# Patient Record
Sex: Male | Born: 1970
Health system: Southern US, Community
[De-identification: ages and names within clinical notes are randomized; demographics above are authoritative.]

## PROBLEM LIST (undated history)

## (undated) DIAGNOSIS — IMO0002 Reserved for concepts with insufficient information to code with codable children: Secondary | ICD-10-CM

## (undated) DIAGNOSIS — F419 Anxiety disorder, unspecified: Secondary | ICD-10-CM

## (undated) HISTORY — DX: Reserved for concepts with insufficient information to code with codable children: IMO0002

## (undated) HISTORY — PX: LIPOMA EXCISION: SHX5283

## (undated) HISTORY — DX: Morbid (severe) obesity due to excess calories: E66.01

## (undated) HISTORY — DX: Anxiety disorder, unspecified: F41.9

---

## 1991-07-07 HISTORY — PX: KNEE ARTHROSCOPY: SUR90

## 2010-01-24 ENCOUNTER — Ambulatory Visit: Payer: Self-pay | Admitting: Cardiology

## 2013-03-14 DIAGNOSIS — F410 Panic disorder [episodic paroxysmal anxiety] without agoraphobia: Secondary | ICD-10-CM | POA: Insufficient documentation

## 2013-07-20 DIAGNOSIS — F411 Generalized anxiety disorder: Secondary | ICD-10-CM | POA: Diagnosis not present

## 2013-08-07 ENCOUNTER — Encounter (INDEPENDENT_AMBULATORY_CARE_PROVIDER_SITE_OTHER): Payer: Self-pay

## 2013-08-07 ENCOUNTER — Ambulatory Visit (INDEPENDENT_AMBULATORY_CARE_PROVIDER_SITE_OTHER): Payer: Medicare Other | Admitting: Family Medicine

## 2013-08-07 ENCOUNTER — Encounter: Payer: Self-pay | Admitting: Family Medicine

## 2013-08-07 VITALS — BP 115/78 | HR 65 | Temp 98.5°F | Ht 69.0 in | Wt 213.0 lb

## 2013-08-07 DIAGNOSIS — D179 Benign lipomatous neoplasm, unspecified: Secondary | ICD-10-CM

## 2013-08-07 DIAGNOSIS — F411 Generalized anxiety disorder: Secondary | ICD-10-CM | POA: Diagnosis not present

## 2013-08-07 MED ORDER — BUPROPION HCL ER (XL) 150 MG PO TB24: 150.0000 mg | ORAL_TABLET | Freq: Every day | ORAL | Status: DC

## 2013-08-07 NOTE — Patient Instructions (Signed)

## 2013-08-07 NOTE — Progress Notes (Signed)
   Subjective:    Patient ID: Tyler Barton, male    DOB: 02-Jul-1971, 43 y.o.   MRN: [id number]  HPI This 43 y.o. male presents for evaluation of re-establishment.  He has hx of GAD. He is seeing psychiatry for GAD.  He is taking celexa, buspar, and diazepam.  He states He wants to get back on wellbutrin because it helped with his energy and he felt better. He has a lipoma on his back and wants to get it removed.   Review of Systems C/o lipoma and GAD   No chest pain, SOB, HA, dizziness, vision change, N/V, diarrhea, constipation, dysuria, urinary urgency or frequency, myalgias, arthralgias or rash.  Objective:   Physical Exam  Vital signs noted  Well developed well nourished male.  HEENT - Head atraumatic Normocephalic                Eyes - PERRLA, Conjuctiva - clear Sclera- Clear EOMI                Ears - EAC's Wnl TM's Wnl Gross Hearing WNL                Nose - Nares patent                 Throat - oropharanx wnl Respiratory - Lungs CTA bilateral Cardiac - RRR S1 and S2 without murmur GI - Abdomen soft Nontender and bowel sounds active x 4 Skin - lipoma on LS spine       Assessment & Plan:  Lipoma - Plan: Ambulatory referral to General Surgery  GAD (generalized anxiety disorder) - Plan: buPROPion (WELLBUTRIN XL) 150 MG 24 hr tablet Discussed with patient that he needs to get his buspar and valium filled at his psychiatrist office and this will not be filled here .  Discussed he needs to follow up for CPE and labs.  Lysbeth Penner FNP

## 2013-08-17 ENCOUNTER — Encounter (INDEPENDENT_AMBULATORY_CARE_PROVIDER_SITE_OTHER): Payer: Self-pay | Admitting: General Surgery

## 2013-08-17 ENCOUNTER — Ambulatory Visit (INDEPENDENT_AMBULATORY_CARE_PROVIDER_SITE_OTHER): Payer: Medicare Other | Admitting: General Surgery

## 2013-08-17 VITALS — BP 120/80 | HR 76 | Temp 98.4°F | Resp 14 | Ht 69.0 in | Wt 209.8 lb

## 2013-08-17 DIAGNOSIS — L918 Other hypertrophic disorders of the skin: Secondary | ICD-10-CM | POA: Insufficient documentation

## 2013-08-17 DIAGNOSIS — D171 Benign lipomatous neoplasm of skin and subcutaneous tissue of trunk: Secondary | ICD-10-CM

## 2013-08-17 DIAGNOSIS — D1779 Benign lipomatous neoplasm of other sites: Secondary | ICD-10-CM | POA: Diagnosis not present

## 2013-08-17 DIAGNOSIS — L909 Atrophic disorder of skin, unspecified: Secondary | ICD-10-CM

## 2013-08-17 DIAGNOSIS — L919 Hypertrophic disorder of the skin, unspecified: Secondary | ICD-10-CM

## 2013-08-17 NOTE — Progress Notes (Signed)
Patient ID: Tyler Barton, male   DOB: 04-Apr-1971, 43 y.o.   MRN: [id number]  Chief Complaint  Patient presents with  . New Evaluation    eval Lipoma on back    HPI Tyler Barton is a 43 y.o. male.   HPI 43 yo WM referred by Stevan Born, NP for evaluation of lower back subcutaneous mass. The patient states the area has been there since around 1990. Over the years it has slowly gotten larger. 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 As it has gotten larger in size it is more uncomfortable to rest his back against a chair frame or an object. It is never been red, swollen or draining any fluid. He denies any weight loss. He denies any fevers or chills. He denies any night sweats. He denies any other soft tissue masses. He does also complain of a right shoulder skin tags that he would like removed as well. He denies any family history of soft tissue cancer. He does not smoke. He does have panic attacks. Past Medical History  Diagnosis Date  . Anxiety   . DDD (degenerative disc disease)     Past Surgical History  Procedure Laterality Date  . Knee arthroscopy Left 1993    Family History  Problem Relation Age of Onset  . Anxiety disorder Mother   . Anxiety disorder Father     Social History History  Substance Use Topics  . Smoking status: Never Smoker   . Smokeless tobacco: Never Used  . Alcohol Use: Yes     Comment: very rare    No Known Allergies  Current Outpatient Prescriptions  Medication Sig Dispense Refill  . buPROPion (WELLBUTRIN XL) 150 MG 24 hr tablet Take 1 tablet (150 mg total) by mouth daily.  30 tablet  11  . busPIRone (BUSPAR) 5 MG tablet Take 5 mg by mouth 2 (two) times daily.      . citalopram (CELEXA) 20 MG tablet Take 20 mg by mouth daily.      . diazepam (VALIUM) 5 MG tablet Take 5 mg by mouth every 8 (eight) hours as needed for anxiety.       No current facility-administered medications for this visit.    Review  of Systems Review of Systems  Constitutional: Negative for fever, chills, appetite change and unexpected weight change.  HENT: Negative for congestion and trouble swallowing.   Eyes: Negative for visual disturbance.  Respiratory: Negative for chest tightness and shortness of breath.   Cardiovascular: Negative for chest pain and leg swelling.       No PND, no orthopnea, no DOE  Gastrointestinal:       See HPI  Genitourinary: Negative for dysuria and hematuria.  Musculoskeletal: Negative.   Skin: Negative for rash.  Neurological: Negative for seizures and speech difficulty.  Hematological: Does not bruise/bleed easily.  Psychiatric/Behavioral: Negative for behavioral problems and confusion. The patient is nervous/anxious (has anxiety/panic attacks).     Blood pressure 120/80, pulse 76, temperature 98.4 F (36.9 C), temperature source Oral, resp. rate 14, height 5\' 9"  (1.753 m), weight 209 lb 12.8 oz (95.165 kg).  Physical Exam Physical Exam  Constitutional: He is oriented to person, place, and time. He appears well-developed and well-nourished. No distress.  HENT:  Head: Normocephalic and atraumatic.  Right Ear: External ear normal.  Left Ear: External ear normal.  Eyes: Conjunctivae are normal. No scleral icterus.  Neck: Normal range of motion.  Neck supple. No tracheal deviation present. No thyromegaly present.  Cardiovascular: Normal rate, normal heart sounds and intact distal pulses.   Pulmonary/Chest: Effort normal and breath sounds normal. No respiratory distress. He has no wheezes.  Abdominal: He exhibits no distension.  Musculoskeletal: Normal range of motion. He exhibits no edema and no tenderness.  Lymphadenopathy:    He has no cervical adenopathy.  Neurological: He is alert and oriented to person, place, and time. He exhibits normal muscle tone.  Skin: Skin is warm and dry. No rash noted. He is not diaphoretic. No erythema. No pallor.     Lower thoracic/upper lumbar  mid back subcu mass approx 5.5 cm wide x 4 cm tall; mobile. Well circumscribed. Soft, NT. No overlying skin changes. Small fleshy skin tag Rt scapula   Psychiatric: He has a normal mood and affect. His behavior is normal. Judgment and thought content normal.    Data Reviewed [place], NP note  Assessment    Lower back subcutaneous mass Back skin tag     Plan    We discussed the etiology and management of lipomas. The patient was given educational material. We discussed that the majority of lipomas are benign although on a rare occasion it can be malignant. I explained also the differential could be a large epidermoid inclusion cyst but I favor a lipoma  We discussed observation versus surgical excision. We discussed the risks and benefits of surgery including but not limited to bleeding, infection, injury to surrounding structures, scarring, cosmetic concerns, possible temporary drain placement, blood clot formation, anesthesia issues, possible recurrence, and the typical postoperative course.   The patient has elected to Proceed with excision of lower back subcutaneous mass as well as back skin tag.  He will be scheduled for surgery in the near future  Champ Keetch M. Redmond Pulling, MD, FACS General, Bariatric, & Minimally Invasive Surgery [facility], [place]          [facility] M 08/17/2013, 9:53 AM

## 2013-08-17 NOTE — Patient Instructions (Signed)

## 2013-08-25 DIAGNOSIS — R221 Localized swelling, mass and lump, neck: Secondary | ICD-10-CM | POA: Diagnosis not present

## 2013-08-25 DIAGNOSIS — R220 Localized swelling, mass and lump, head: Secondary | ICD-10-CM | POA: Diagnosis not present

## 2013-08-28 ENCOUNTER — Encounter: Payer: Self-pay | Admitting: Family Medicine

## 2013-08-28 ENCOUNTER — Ambulatory Visit (INDEPENDENT_AMBULATORY_CARE_PROVIDER_SITE_OTHER): Payer: Medicare Other | Admitting: Family Medicine

## 2013-08-28 VITALS — BP 114/77 | HR 72 | Temp 97.9°F | Ht 68.5 in | Wt 212.0 lb

## 2013-08-28 DIAGNOSIS — Z0000 Encounter for general adult medical examination without abnormal findings: Secondary | ICD-10-CM

## 2013-08-28 DIAGNOSIS — R350 Frequency of micturition: Secondary | ICD-10-CM | POA: Diagnosis not present

## 2013-08-28 DIAGNOSIS — M25529 Pain in unspecified elbow: Secondary | ICD-10-CM

## 2013-08-28 DIAGNOSIS — E785 Hyperlipidemia, unspecified: Secondary | ICD-10-CM

## 2013-08-28 DIAGNOSIS — Z139 Encounter for screening, unspecified: Secondary | ICD-10-CM

## 2013-08-28 LAB — POCT CBC
Granulocyte percent: 63.6 %G (ref 37–80)
HCT, POC: 46.4 % (ref 43.5–53.7)
Hemoglobin: 14.6 g/dL (ref 14.1–18.1)
Lymph, poc: 2.2 (ref 0.6–3.4)
MCH, POC: 28.1 pg (ref 27–31.2)
MCHC: 31.6 g/dL — AB (ref 31.8–35.4)
MCV: 89.1 fL (ref 80–97)
MPV: 6.9 fL (ref 0–99.8)
POC Granulocyte: 4.8 (ref 2–6.9)
POC LYMPH PERCENT: 29.1 %L (ref 10–50)
Platelet Count, POC: 355 10*3/uL (ref 142–424)
RBC: 5.2 M/uL (ref 4.69–6.13)
RDW, POC: 15 %
WBC: 7.5 10*3/uL (ref 4.6–10.2)

## 2013-08-28 MED ORDER — NAPROXEN 500 MG PO TABS: 500.0000 mg | ORAL_TABLET | Freq: Two times a day (BID) | ORAL | Status: DC

## 2013-08-28 NOTE — Progress Notes (Signed)
   Subjective:    Patient ID: Tyler Barton, male    DOB: 1971/04/11, 43 y.o.   MRN: [id number]  HPI This 43 y.o. male presents for evaluation of CPE and left elbow discomfort.  He is seeing Surgery for a lipoma on his back which he plans to have removed.   Review of Systems C/o left elbow pain No chest pain, SOB, HA, dizziness, vision change, N/V, diarrhea, constipation, dysuria, urinary urgency or frequency, myalgias, arthralgias or rash.     Objective:   Physical Exam Vital signs noted  Well developed well nourished male.  HEENT - Head atraumatic Normocephalic                Eyes - PERRLA, Conjuctiva - clear Sclera- Clear EOMI                Ears - EAC's Wnl TM's Wnl Gross Hearing WNL                Nose - Nares patent                 Throat - oropharanx wnl Respiratory - Lungs CTA bilateral Cardiac - RRR S1 and S2 without murmur GI - Abdomen soft Nontender and bowel sounds active x 4 Extremities - No edema. Neuro - Grossly intact. MS - TTP left elbow at olecranon bursae region      Assessment & Plan:  Screening - Plan: POCT CBC, CMP14+EGFR, Lipid panel, PSA, total and free, Thyroid Panel With TSH  Elbow pain - Plan: POCT CBC, CMP14+EGFR, Lipid panel, PSA, total and free, Thyroid Panel With TSH, naproxen (NAPROSYN) 500 MG tablet  Routine general medical examination at a health care facility - Plan: POCT CBC, CMP14+EGFR, Lipid panel, PSA, total and free, Thyroid Panel With TSH  Other and unspecified hyperlipidemia - Plan: Lipid panel  Urinary frequency - Plan: PSA, total and free  Lysbeth Penner FNP

## 2013-08-29 LAB — CMP14+EGFR
ALT: 33 IU/L (ref 0–44)
AST: 24 IU/L (ref 0–40)
Albumin/Globulin Ratio: 2.4 (ref 1.1–2.5)
Albumin: 4.6 g/dL (ref 3.5–5.5)
Alkaline Phosphatase: 35 IU/L — ABNORMAL LOW (ref 39–117)
BUN/Creatinine Ratio: 8 — ABNORMAL LOW (ref 9–20)
BUN: 9 mg/dL (ref 6–24)
CO2: 24 mmol/L (ref 18–29)
Calcium: 10 mg/dL (ref 8.7–10.2)
Chloride: 102 mmol/L (ref 97–108)
Creatinine, Ser: 1.09 mg/dL (ref 0.76–1.27)
GFR calc Af Amer: 96 mL/min/{1.73_m2} (ref >59)
GFR calc non Af Amer: 83 mL/min/{1.73_m2} (ref >59)
Globulin, Total: 1.9 g/dL (ref 1.5–4.5)
Glucose: 104 mg/dL — ABNORMAL HIGH (ref 65–99)
Potassium: 4.4 mmol/L (ref 3.5–5.2)
Sodium: 142 mmol/L (ref 134–144)
Total Bilirubin: 0.7 mg/dL (ref 0.0–1.2)
Total Protein: 6.5 g/dL (ref 6.0–8.5)

## 2013-08-29 LAB — THYROID PANEL WITH TSH
Free Thyroxine Index: 2.5 (ref 1.2–4.9)
T3 Uptake Ratio: 27 % (ref 24–39)
T4, Total: 9.1 ug/dL (ref 4.5–12.0)
TSH: 1.21 u[IU]/mL (ref 0.450–4.500)

## 2013-08-29 LAB — PSA, TOTAL AND FREE
PSA, Free Pct: 36 %
PSA, Free: 0.36 ng/mL
PSA: 1 ng/mL (ref 0.0–4.0)

## 2013-08-29 LAB — LIPID PANEL
Chol/HDL Ratio: 4.4 ratio units (ref 0.0–5.0)
Cholesterol, Total: 195 mg/dL (ref 100–199)
HDL: 44 mg/dL (ref >39)
LDL Calculated: 123 mg/dL — ABNORMAL HIGH (ref 0–99)
Triglycerides: 141 mg/dL (ref 0–149)
VLDL Cholesterol Cal: 28 mg/dL (ref 5–40)

## 2013-08-30 ENCOUNTER — Encounter: Payer: Medicaid Other | Admitting: Family Medicine

## 2013-09-08 ENCOUNTER — Other Ambulatory Visit (INDEPENDENT_AMBULATORY_CARE_PROVIDER_SITE_OTHER): Payer: Self-pay | Admitting: *Deleted

## 2013-09-08 ENCOUNTER — Other Ambulatory Visit (INDEPENDENT_AMBULATORY_CARE_PROVIDER_SITE_OTHER): Payer: Self-pay | Admitting: General Surgery

## 2013-09-08 DIAGNOSIS — L909 Atrophic disorder of skin, unspecified: Secondary | ICD-10-CM | POA: Diagnosis not present

## 2013-09-08 DIAGNOSIS — L723 Sebaceous cyst: Secondary | ICD-10-CM

## 2013-09-08 DIAGNOSIS — Q828 Other specified congenital malformations of skin: Secondary | ICD-10-CM | POA: Diagnosis not present

## 2013-09-08 DIAGNOSIS — L919 Hypertrophic disorder of the skin, unspecified: Secondary | ICD-10-CM | POA: Diagnosis not present

## 2013-09-08 MED ORDER — OXYCODONE-ACETAMINOPHEN 5-325 MG PO TABS: 1.0000 | ORAL_TABLET | ORAL | Status: DC | PRN

## 2013-10-05 ENCOUNTER — Encounter (INDEPENDENT_AMBULATORY_CARE_PROVIDER_SITE_OTHER): Payer: Self-pay | Admitting: General Surgery

## 2013-10-05 ENCOUNTER — Ambulatory Visit (INDEPENDENT_AMBULATORY_CARE_PROVIDER_SITE_OTHER): Payer: Medicare Other | Admitting: General Surgery

## 2013-10-05 VITALS — BP 123/69 | HR 77 | Temp 98.2°F | Resp 12 | Ht 69.0 in | Wt 214.8 lb

## 2013-10-05 DIAGNOSIS — Z09 Encounter for follow-up examination after completed treatment for conditions other than malignant neoplasm: Secondary | ICD-10-CM

## 2013-10-05 NOTE — Patient Instructions (Signed)
Follow up with your PCP regarding your elbow

## 2013-10-05 NOTE — Progress Notes (Signed)
Subjective:     Patient ID: Tyler Barton, male   DOB: [birth year], 43 y.o.   MRN: [id number]  HPI 43 year old Caucasian male comes in for followup after undergoing excision of a lower back epidermoid inclusion cyst and  the left posterior skin tag On March 6. He did quite well after surgery. He denies any fever, chills, nausea or vomiting. He denies any wound problems. He states that he has been having some problems with his left elbow for quite some time.  Review of Systems     Objective:   Physical Exam BP 123/69  Pulse 77  Temp(Src) 98.2 F (36.8 C) (Temporal)  Resp 12  Ht 5\' 9"  (1.753 m)  Wt 214 lb 12.8 oz (97.433 kg)  BMI 31.71 kg/m2 No apparent distress Back-well-healed lower back midline incision. No cellulitis, induration or fluctuance. Small subcutaneous hematoma probably. Skin tag site well healed. Left elbow-no visual or palpable gross abnormalities    Assessment:     Status post excision of a lower back epidermoid inclusion cyst and left posterior shoulder skin tag     Plan:     Overall I think he is doing well. I explained that the small knot underneath his lower back incision is a probable hematoma and should smooth out with time. I have released him to full activities. We talked about scar prevention. He was given a copy of his pathology report which showed epidermoid inclusion cyst. I am not sure what's bothering his elbow. I've asked him to discuss it with his primary care physician. Followup as needed  Leighton Ruff. Redmond Pulling, MD, FACS General, Bariatric, & Minimally Invasive Surgery [facility], [place]

## 2013-10-10 ENCOUNTER — Telehealth: Payer: Self-pay | Admitting: Family Medicine

## 2013-10-12 ENCOUNTER — Other Ambulatory Visit: Payer: Self-pay | Admitting: Family Medicine

## 2013-10-12 DIAGNOSIS — M7712 Lateral epicondylitis, left elbow: Secondary | ICD-10-CM

## 2013-10-13 DIAGNOSIS — F411 Generalized anxiety disorder: Secondary | ICD-10-CM | POA: Diagnosis not present

## 2013-10-25 DIAGNOSIS — M7710 Lateral epicondylitis, unspecified elbow: Secondary | ICD-10-CM | POA: Diagnosis not present

## 2013-10-25 DIAGNOSIS — M25529 Pain in unspecified elbow: Secondary | ICD-10-CM | POA: Diagnosis not present

## 2013-10-25 DIAGNOSIS — M659 Synovitis and tenosynovitis, unspecified: Secondary | ICD-10-CM | POA: Diagnosis not present

## 2013-10-30 DIAGNOSIS — M79609 Pain in unspecified limb: Secondary | ICD-10-CM | POA: Diagnosis not present

## 2013-10-30 DIAGNOSIS — M6281 Muscle weakness (generalized): Secondary | ICD-10-CM | POA: Diagnosis not present

## 2013-10-30 DIAGNOSIS — M6580 Other synovitis and tenosynovitis, unspecified site: Secondary | ICD-10-CM | POA: Diagnosis not present

## 2013-11-01 DIAGNOSIS — M79609 Pain in unspecified limb: Secondary | ICD-10-CM | POA: Diagnosis not present

## 2013-11-01 DIAGNOSIS — M6580 Other synovitis and tenosynovitis, unspecified site: Secondary | ICD-10-CM | POA: Diagnosis not present

## 2013-11-01 DIAGNOSIS — M6281 Muscle weakness (generalized): Secondary | ICD-10-CM | POA: Diagnosis not present

## 2013-11-06 DIAGNOSIS — M79609 Pain in unspecified limb: Secondary | ICD-10-CM | POA: Diagnosis not present

## 2013-11-06 DIAGNOSIS — M6580 Other synovitis and tenosynovitis, unspecified site: Secondary | ICD-10-CM | POA: Diagnosis not present

## 2013-11-06 DIAGNOSIS — M6281 Muscle weakness (generalized): Secondary | ICD-10-CM | POA: Diagnosis not present

## 2013-11-09 DIAGNOSIS — M79609 Pain in unspecified limb: Secondary | ICD-10-CM | POA: Diagnosis not present

## 2013-11-09 DIAGNOSIS — M6580 Other synovitis and tenosynovitis, unspecified site: Secondary | ICD-10-CM | POA: Diagnosis not present

## 2013-11-09 DIAGNOSIS — M6281 Muscle weakness (generalized): Secondary | ICD-10-CM | POA: Diagnosis not present

## 2013-11-14 DIAGNOSIS — M6580 Other synovitis and tenosynovitis, unspecified site: Secondary | ICD-10-CM | POA: Diagnosis not present

## 2013-11-14 DIAGNOSIS — M79609 Pain in unspecified limb: Secondary | ICD-10-CM | POA: Diagnosis not present

## 2013-11-14 DIAGNOSIS — M6281 Muscle weakness (generalized): Secondary | ICD-10-CM | POA: Diagnosis not present

## 2013-11-16 DIAGNOSIS — M79609 Pain in unspecified limb: Secondary | ICD-10-CM | POA: Diagnosis not present

## 2013-11-16 DIAGNOSIS — M6281 Muscle weakness (generalized): Secondary | ICD-10-CM | POA: Diagnosis not present

## 2013-11-16 DIAGNOSIS — M6580 Other synovitis and tenosynovitis, unspecified site: Secondary | ICD-10-CM | POA: Diagnosis not present

## 2013-11-20 DIAGNOSIS — M6580 Other synovitis and tenosynovitis, unspecified site: Secondary | ICD-10-CM | POA: Diagnosis not present

## 2013-11-20 DIAGNOSIS — M79609 Pain in unspecified limb: Secondary | ICD-10-CM | POA: Diagnosis not present

## 2013-11-20 DIAGNOSIS — M6281 Muscle weakness (generalized): Secondary | ICD-10-CM | POA: Diagnosis not present

## 2013-11-22 DIAGNOSIS — M25529 Pain in unspecified elbow: Secondary | ICD-10-CM | POA: Diagnosis not present

## 2013-12-01 DIAGNOSIS — M6580 Other synovitis and tenosynovitis, unspecified site: Secondary | ICD-10-CM | POA: Diagnosis not present

## 2013-12-01 DIAGNOSIS — M6281 Muscle weakness (generalized): Secondary | ICD-10-CM | POA: Diagnosis not present

## 2013-12-01 DIAGNOSIS — M79609 Pain in unspecified limb: Secondary | ICD-10-CM | POA: Diagnosis not present

## 2013-12-05 DIAGNOSIS — M6281 Muscle weakness (generalized): Secondary | ICD-10-CM | POA: Diagnosis not present

## 2013-12-05 DIAGNOSIS — M79609 Pain in unspecified limb: Secondary | ICD-10-CM | POA: Diagnosis not present

## 2013-12-05 DIAGNOSIS — M6580 Other synovitis and tenosynovitis, unspecified site: Secondary | ICD-10-CM | POA: Diagnosis not present

## 2013-12-07 DIAGNOSIS — M6281 Muscle weakness (generalized): Secondary | ICD-10-CM | POA: Diagnosis not present

## 2013-12-07 DIAGNOSIS — M79609 Pain in unspecified limb: Secondary | ICD-10-CM | POA: Diagnosis not present

## 2013-12-07 DIAGNOSIS — M6580 Other synovitis and tenosynovitis, unspecified site: Secondary | ICD-10-CM | POA: Diagnosis not present

## 2013-12-11 DIAGNOSIS — M6281 Muscle weakness (generalized): Secondary | ICD-10-CM | POA: Diagnosis not present

## 2013-12-11 DIAGNOSIS — M6580 Other synovitis and tenosynovitis, unspecified site: Secondary | ICD-10-CM | POA: Diagnosis not present

## 2013-12-11 DIAGNOSIS — M79609 Pain in unspecified limb: Secondary | ICD-10-CM | POA: Diagnosis not present

## 2013-12-14 DIAGNOSIS — M6281 Muscle weakness (generalized): Secondary | ICD-10-CM | POA: Diagnosis not present

## 2013-12-14 DIAGNOSIS — M6580 Other synovitis and tenosynovitis, unspecified site: Secondary | ICD-10-CM | POA: Diagnosis not present

## 2013-12-14 DIAGNOSIS — M79609 Pain in unspecified limb: Secondary | ICD-10-CM | POA: Diagnosis not present

## 2013-12-18 DIAGNOSIS — M6281 Muscle weakness (generalized): Secondary | ICD-10-CM | POA: Diagnosis not present

## 2013-12-18 DIAGNOSIS — M79609 Pain in unspecified limb: Secondary | ICD-10-CM | POA: Diagnosis not present

## 2013-12-18 DIAGNOSIS — M6580 Other synovitis and tenosynovitis, unspecified site: Secondary | ICD-10-CM | POA: Diagnosis not present

## 2013-12-20 DIAGNOSIS — M7710 Lateral epicondylitis, unspecified elbow: Secondary | ICD-10-CM | POA: Diagnosis not present

## 2013-12-20 DIAGNOSIS — M25529 Pain in unspecified elbow: Secondary | ICD-10-CM | POA: Diagnosis not present

## 2014-01-03 DIAGNOSIS — F3289 Other specified depressive episodes: Secondary | ICD-10-CM | POA: Diagnosis not present

## 2014-01-03 DIAGNOSIS — F329 Major depressive disorder, single episode, unspecified: Secondary | ICD-10-CM | POA: Diagnosis not present

## 2014-01-17 DIAGNOSIS — M659 Synovitis and tenosynovitis, unspecified: Secondary | ICD-10-CM | POA: Diagnosis not present

## 2014-01-17 DIAGNOSIS — M25529 Pain in unspecified elbow: Secondary | ICD-10-CM | POA: Diagnosis not present

## 2014-01-17 DIAGNOSIS — M7710 Lateral epicondylitis, unspecified elbow: Secondary | ICD-10-CM | POA: Diagnosis not present

## 2014-02-12 ENCOUNTER — Ambulatory Visit (INDEPENDENT_AMBULATORY_CARE_PROVIDER_SITE_OTHER): Payer: Medicare Other | Admitting: Family Medicine

## 2014-02-12 ENCOUNTER — Encounter: Payer: Self-pay | Admitting: Family Medicine

## 2014-02-12 VITALS — BP 121/79 | HR 71 | Temp 99.0°F | Ht 69.0 in | Wt 200.0 lb

## 2014-02-12 DIAGNOSIS — J0120 Acute ethmoidal sinusitis, unspecified: Secondary | ICD-10-CM | POA: Diagnosis not present

## 2014-02-12 MED ORDER — AMOXICILLIN 875 MG PO TABS: 875.0000 mg | ORAL_TABLET | Freq: Two times a day (BID) | ORAL | Status: DC

## 2014-02-12 MED ORDER — FLUTICASONE PROPIONATE 50 MCG/ACT NA SUSP: 2.0000 | Freq: Every day | NASAL | Status: DC

## 2014-02-12 MED ORDER — METHYLPREDNISOLONE ACETATE 80 MG/ML IJ SUSP
80.0000 mg | Freq: Once | INTRAMUSCULAR | Status: AC
  Administered 2014-02-12: 80 mg via INTRAMUSCULAR

## 2014-02-12 NOTE — Progress Notes (Signed)
   Subjective:    Patient ID: Tyler Barton, male    DOB: 08/01/1970, 43 y.o.   MRN: [id number]  HPI  This 43 y.o. male presents for evaluation of sinus congestion and facial pain and uri sx's.  Review of Systems No chest pain, SOB, HA, dizziness, vision change, N/V, diarrhea, constipation, dysuria, urinary urgency or frequency, myalgias, arthralgias or rash.     Objective:   Physical Exam Vital signs noted  Well developed well nourished male.  HEENT - Head atraumatic Normocephalic                Eyes - PERRLA, Conjuctiva - clear Sclera- Clear EOMI                Ears - EAC's Wnl TM's Wnl Gross Hearing WNL                Throat - oropharanx wnl Respiratory - Lungs CTA bilateral Cardiac - RRR S1 and S2 without murmur GI - Abdomen soft Nontender and bowel sounds active x 4 Extremities - No edema. Neuro - Grossly intact.       Assessment & Plan:  Subacute ethmoidal sinusitis - Plan: amoxicillin (AMOXIL) 875 MG tablet, methylPREDNISolone acetate (DEPO-MEDROL) injection 80 mg, fluticasone (FLONASE) 50 MCG/ACT nasal spray  Push po fluids, rest, tylenol and motrin otc prn as directed for fever, arthralgias, and myalgias.  Follow up prn if sx's continue or persist.  Lysbeth Penner FNP

## 2014-03-08 DIAGNOSIS — Z79899 Other long term (current) drug therapy: Secondary | ICD-10-CM | POA: Diagnosis not present

## 2014-03-08 DIAGNOSIS — R0602 Shortness of breath: Secondary | ICD-10-CM | POA: Diagnosis not present

## 2014-03-08 DIAGNOSIS — F411 Generalized anxiety disorder: Secondary | ICD-10-CM | POA: Diagnosis not present

## 2014-03-08 DIAGNOSIS — K219 Gastro-esophageal reflux disease without esophagitis: Secondary | ICD-10-CM | POA: Diagnosis not present

## 2014-03-14 ENCOUNTER — Ambulatory Visit: Payer: Medicare Other | Admitting: Family Medicine

## 2014-05-17 DIAGNOSIS — F321 Major depressive disorder, single episode, moderate: Secondary | ICD-10-CM | POA: Diagnosis not present

## 2014-07-11 DIAGNOSIS — F321 Major depressive disorder, single episode, moderate: Secondary | ICD-10-CM | POA: Diagnosis not present

## 2014-07-27 ENCOUNTER — Ambulatory Visit: Payer: Medicare Other | Admitting: Family Medicine

## 2014-11-21 ENCOUNTER — Encounter: Payer: Self-pay | Admitting: Family Medicine

## 2014-11-21 ENCOUNTER — Ambulatory Visit (INDEPENDENT_AMBULATORY_CARE_PROVIDER_SITE_OTHER): Payer: Medicare Other | Admitting: Family Medicine

## 2014-11-21 VITALS — BP 125/83 | HR 69 | Temp 97.9°F | Ht 69.0 in | Wt 214.0 lb

## 2014-11-21 DIAGNOSIS — J0121 Acute recurrent ethmoidal sinusitis: Secondary | ICD-10-CM | POA: Diagnosis not present

## 2014-11-21 DIAGNOSIS — J322 Chronic ethmoidal sinusitis: Secondary | ICD-10-CM | POA: Insufficient documentation

## 2014-11-21 DIAGNOSIS — K589 Irritable bowel syndrome without diarrhea: Secondary | ICD-10-CM | POA: Diagnosis not present

## 2014-11-21 MED ORDER — LINACLOTIDE 145 MCG PO CAPS: 145.0000 ug | ORAL_CAPSULE | Freq: Every day | ORAL | Status: DC

## 2014-11-21 NOTE — Progress Notes (Signed)
   Subjective:    Patient ID: Tyler Barton, male    DOB: August 12, 1970, 44 y.o.   MRN: [id number]  HPI 44 year old gentleman who complains of some sinus congestion facial pain and swelling. He has ongoing seasonal allergies for which he uses Flonase and Claritin. He's also complaining of fatigue. Drainage has color.  There are no active problems to display for this patient.  Outpatient Encounter Prescriptions as of 11/21/2014  Medication Sig  . busPIRone (BUSPAR) 5 MG tablet Take 5 mg by mouth 2 (two) times daily.  . citalopram (CELEXA) 20 MG tablet Take 20 mg by mouth daily.  . fluticasone (FLONASE) 50 MCG/ACT nasal spray Place 2 sprays into both nostrils daily.  Marland Kitchen loratadine (CLARITIN) 10 MG tablet Take 10 mg by mouth daily.  . [DISCONTINUED] amoxicillin (AMOXIL) 875 MG tablet Take 1 tablet (875 mg total) by mouth 2 (two) times daily.  . [DISCONTINUED] buPROPion (WELLBUTRIN XL) 150 MG 24 hr tablet Take 1 tablet (150 mg total) by mouth daily.  . [DISCONTINUED] diazepam (VALIUM) 5 MG tablet Take 5 mg by mouth every 8 (eight) hours as needed for anxiety.  . [DISCONTINUED] naproxen (NAPROSYN) 500 MG tablet Take 1 tablet (500 mg total) by mouth 2 (two) times daily with a meal.   No facility-administered encounter medications on file as of 11/21/2014.      Review of Systems  Constitutional: Positive for fatigue.  HENT: Positive for sinus pressure.   Respiratory: Negative.   Cardiovascular: Negative.   Neurological: Negative.        Objective:   Physical Exam  Constitutional: He is oriented to person, place, and time. He appears well-developed and well-nourished.  HENT:  Head: Normocephalic.  Mouth/Throat: Oropharynx is clear and moist.  Maxillary sinus tenderness  Neck: Normal range of motion.  Cardiovascular: Normal rate.   Pulmonary/Chest: Effort normal and breath sounds normal.  Abdominal: Soft.  Neurological: He is alert and oriented to person, place, and time.    BP 125/83  mmHg  Pulse 69  Temp(Src) 97.9 F (36.6 C) (Oral)  Ht 5\' 9"  (1.753 m)  Wt 214 lb (97.07 kg)  BMI 31.59 kg/m2       Assessment & Plan:  1. IBS (irritable bowel syndrome) Since his symptoms are mostly related to constipation I gave him some samples of Linzess to try. He currently is using what sounds like a generic equivalent of senna S and I told him this would be okay to continue as well  2. Acute recurrent ethmoidal sinusitis Will treat with Augmentin 875 twice a day. Stop antihistamine. Continue Flonase and pick up Mucinex or Mucinex D. May go back to Claritin once this course of antibiotics has finished  Wardell Honour MD

## 2014-11-22 ENCOUNTER — Telehealth: Payer: Self-pay | Admitting: Family Medicine

## 2014-11-22 DIAGNOSIS — J0120 Acute ethmoidal sinusitis, unspecified: Secondary | ICD-10-CM

## 2014-11-22 MED ORDER — AMOXICILLIN-POT CLAVULANATE 875-125 MG PO TABS: 1.0000 | ORAL_TABLET | Freq: Two times a day (BID) | ORAL | Status: DC

## 2014-11-22 MED ORDER — CITALOPRAM HYDROBROMIDE 20 MG PO TABS: 20.0000 mg | ORAL_TABLET | Freq: Every day | ORAL | Status: DC

## 2014-11-22 MED ORDER — BUSPIRONE HCL 5 MG PO TABS: 5.0000 mg | ORAL_TABLET | Freq: Two times a day (BID) | ORAL | Status: DC

## 2014-11-22 MED ORDER — FLUTICASONE PROPIONATE 50 MCG/ACT NA SUSP: 2.0000 | Freq: Every day | NASAL | Status: DC

## 2014-11-22 NOTE — Telephone Encounter (Signed)
Medicines sent to pharmacy. Tried to contact patient but voicemail was not setup.

## 2014-11-22 NOTE — Addendum Note (Signed)
Addended by: Ilean China on: 11/22/2014 09:30 AM   Modules accepted: Orders

## 2014-11-22 NOTE — Telephone Encounter (Signed)
Pt notified RXs sent into pharmacy 

## 2015-01-15 DIAGNOSIS — F321 Major depressive disorder, single episode, moderate: Secondary | ICD-10-CM | POA: Diagnosis not present

## 2015-04-04 ENCOUNTER — Encounter: Payer: Self-pay | Admitting: Physician Assistant

## 2015-04-04 ENCOUNTER — Encounter (INDEPENDENT_AMBULATORY_CARE_PROVIDER_SITE_OTHER): Payer: Self-pay

## 2015-04-04 ENCOUNTER — Ambulatory Visit (INDEPENDENT_AMBULATORY_CARE_PROVIDER_SITE_OTHER): Payer: Medicare Other | Admitting: Physician Assistant

## 2015-04-04 VITALS — BP 123/87 | HR 67 | Temp 97.5°F | Ht 69.0 in | Wt 210.0 lb

## 2015-04-04 DIAGNOSIS — J0120 Acute ethmoidal sinusitis, unspecified: Secondary | ICD-10-CM

## 2015-04-04 DIAGNOSIS — F321 Major depressive disorder, single episode, moderate: Secondary | ICD-10-CM | POA: Diagnosis not present

## 2015-04-04 MED ORDER — FLUTICASONE PROPIONATE 50 MCG/ACT NA SUSP: 2.0000 | Freq: Every day | NASAL | Status: DC

## 2015-04-04 MED ORDER — AMOXICILLIN-POT CLAVULANATE 875-125 MG PO TABS: 1.0000 | ORAL_TABLET | Freq: Two times a day (BID) | ORAL | Status: DC

## 2015-04-04 NOTE — Progress Notes (Signed)
   Subjective:    Patient ID: Tyler Barton, male    DOB: 11/14/1970, 44 y.o.   MRN: [id number]  HPI 44 y/o male presents with c/o sinus drainage, HA. This is a constant problem for him.   He has tried Claritin 10mg  and Advil head and sinus in addition to Flonase, which has somewhat helped.     Review of Systems  Constitutional: Negative.   HENT: Positive for congestion (nasal ), postnasal drip and sinus pressure.   Neurological: Positive for headaches.       Objective:   Physical Exam  Constitutional: He is oriented to person, place, and time. He appears well-developed and well-nourished. No distress.  HENT:  Head: Normocephalic.  Right Ear: External ear normal.  Left Ear: External ear normal.  Posterior pharynx injection and erythema   Cardiovascular: Normal rate and normal heart sounds.  Exam reveals no gallop and no friction rub.   No murmur heard. Pulmonary/Chest: Effort normal and breath sounds normal. No respiratory distress. He has no wheezes. He has no rales. He exhibits no tenderness.  Neurological: He is alert and oriented to person, place, and time.  Skin: He is not diaphoretic.  Nursing note and vitals reviewed.         Assessment & Plan:  1. Subacute ethmoidal sinusitis - otc plain musinex - netti pot  - fluticasone (FLONASE) 50 MCG/ACT nasal spray; Place 2 sprays into both nostrils daily.  Dispense: 16 g; Refill: 5 - amoxicillin-clavulanate (AUGMENTIN) 875-125 MG tablet; Take 1 tablet by mouth 2 (two) times daily.  Dispense: 20 tablet; Refill: 0  - continue zyrtec or claritin    Tiffany A. Benjamin Stain PA-C

## 2015-04-04 NOTE — Patient Instructions (Signed)
otc plain musinex ( blue/white box) Stop Cold/sinus medication  Netti pot   Sinusitis Sinusitis is redness, soreness, and inflammation of the paranasal sinuses. Paranasal sinuses are air pockets within the bones of your face (beneath the eyes, the middle of the forehead, or above the eyes). In healthy paranasal sinuses, mucus is able to drain out, and air is able to circulate through them by way of your nose. However, when your paranasal sinuses are inflamed, mucus and air can become trapped. This can allow bacteria and other germs to grow and cause infection. Sinusitis can develop quickly and last only a short time (acute) or continue over a long period (chronic). Sinusitis that lasts for more than 12 weeks is considered chronic.  CAUSES  Causes of sinusitis include:  Allergies.  Structural abnormalities, such as displacement of the cartilage that separates your nostrils (deviated septum), which can decrease the air flow through your nose and sinuses and affect sinus drainage.  Functional abnormalities, such as when the small hairs (cilia) that line your sinuses and help remove mucus do not work properly or are not present. SIGNS AND SYMPTOMS  Symptoms of acute and chronic sinusitis are the same. The primary symptoms are pain and pressure around the affected sinuses. Other symptoms include:  Upper toothache.  Earache.  Headache.  Bad breath.  Decreased sense of smell and taste.  A cough, which worsens when you are lying flat.  Fatigue.  Fever.  Thick drainage from your nose, which often is green and may contain pus (purulent).  Swelling and warmth over the affected sinuses. DIAGNOSIS  Your health care provider will perform a physical exam. During the exam, your health care provider may:  Look in your nose for signs of abnormal growths in your nostrils (nasal polyps).  Tap over the affected sinus to check for signs of infection.  View the inside of your sinuses (endoscopy)  using an imaging device that has a light attached (endoscope). If your health care provider suspects that you have chronic sinusitis, one or more of the following tests may be recommended:  Allergy tests.  Nasal culture. A sample of mucus is taken from your nose, sent to a lab, and screened for bacteria.  Nasal cytology. A sample of mucus is taken from your nose and examined by your health care provider to determine if your sinusitis is related to an allergy. TREATMENT  Most cases of acute sinusitis are related to a viral infection and will resolve on their own within 10 days. Sometimes medicines are prescribed to help relieve symptoms (pain medicine, decongestants, nasal steroid sprays, or saline sprays).  However, for sinusitis related to a bacterial infection, your health care provider will prescribe antibiotic medicines. These are medicines that will help kill the bacteria causing the infection.  Rarely, sinusitis is caused by a fungal infection. In theses cases, your health care provider will prescribe antifungal medicine. For some cases of chronic sinusitis, surgery is needed. Generally, these are cases in which sinusitis recurs more than 3 times per year, despite other treatments. HOME CARE INSTRUCTIONS   Drink plenty of water. Water helps thin the mucus so your sinuses can drain more easily.  Use a humidifier.  Inhale steam 3 to 4 times a day (for example, sit in the bathroom with the shower running).  Apply a warm, moist washcloth to your face 3 to 4 times a day, or as directed by your health care provider.  Use saline nasal sprays to help moisten and clean  your sinuses.  Take medicines only as directed by your health care provider.  If you were prescribed either an antibiotic or antifungal medicine, finish it all even if you start to feel better. SEEK IMMEDIATE MEDICAL CARE IF:  You have increasing pain or severe headaches.  You have nausea, vomiting, or drowsiness.  You  have swelling around your face.  You have vision problems.  You have a stiff neck.  You have difficulty breathing. MAKE SURE YOU:   Understand these instructions.  Will watch your condition.  Will get help right away if you are not doing well or get worse. Document Released: 06/22/2005 Document Revised: 11/06/2013 Document Reviewed: 07/07/2011 [facility] Patient Information 2015 [place], [place]. This information is not intended to replace advice given to you by your health care provider. Make sure you discuss any questions you have with your health care provider.

## 2015-04-15 ENCOUNTER — Telehealth: Payer: Self-pay | Admitting: Family Medicine

## 2015-04-15 DIAGNOSIS — J0120 Acute ethmoidal sinusitis, unspecified: Secondary | ICD-10-CM

## 2015-04-15 NOTE — Telephone Encounter (Signed)
Patient was seen by Tiffany on 9/29 and he has finished the antibiotic last night. He is still coughing and having a lot of drainage and congestion. He has taken mucinex and robitussin.

## 2015-04-15 NOTE — Telephone Encounter (Signed)
We can refill Augmentin for another round of anabiotic's or we can refer to ENT

## 2015-04-16 MED ORDER — AMOXICILLIN-POT CLAVULANATE 875-125 MG PO TABS
1.0000 | ORAL_TABLET | Freq: Two times a day (BID) | ORAL | Status: DC
Start: 2015-04-16 — End: 2015-07-10

## 2015-04-16 NOTE — Telephone Encounter (Signed)
Patient aware that antibiotic has been sent in and referral to ENT has been made.  Patient also aware to call back within 1-2 week if he has not heard anything about referral

## 2015-04-26 ENCOUNTER — Telehealth: Payer: Self-pay | Admitting: Family Medicine

## 2015-04-29 ENCOUNTER — Ambulatory Visit: Payer: Medicare Other

## 2015-06-17 DIAGNOSIS — M79602 Pain in left arm: Secondary | ICD-10-CM | POA: Diagnosis not present

## 2015-06-17 DIAGNOSIS — Z8249 Family history of ischemic heart disease and other diseases of the circulatory system: Secondary | ICD-10-CM | POA: Diagnosis not present

## 2015-06-17 DIAGNOSIS — K219 Gastro-esophageal reflux disease without esophagitis: Secondary | ICD-10-CM | POA: Diagnosis not present

## 2015-06-17 DIAGNOSIS — R0789 Other chest pain: Secondary | ICD-10-CM | POA: Diagnosis not present

## 2015-06-17 DIAGNOSIS — F410 Panic disorder [episodic paroxysmal anxiety] without agoraphobia: Secondary | ICD-10-CM | POA: Diagnosis not present

## 2015-06-17 DIAGNOSIS — M549 Dorsalgia, unspecified: Secondary | ICD-10-CM | POA: Diagnosis not present

## 2015-06-17 DIAGNOSIS — Z79899 Other long term (current) drug therapy: Secondary | ICD-10-CM | POA: Diagnosis not present

## 2015-06-17 DIAGNOSIS — R079 Chest pain, unspecified: Secondary | ICD-10-CM | POA: Diagnosis not present

## 2015-06-17 DIAGNOSIS — Z833 Family history of diabetes mellitus: Secondary | ICD-10-CM | POA: Diagnosis not present

## 2015-07-10 ENCOUNTER — Encounter: Payer: Self-pay | Admitting: Family Medicine

## 2015-07-10 ENCOUNTER — Ambulatory Visit (INDEPENDENT_AMBULATORY_CARE_PROVIDER_SITE_OTHER): Payer: Medicare Other | Admitting: Family Medicine

## 2015-07-10 VITALS — BP 123/76 | HR 77 | Temp 97.9°F | Ht 69.0 in | Wt 214.0 lb

## 2015-07-10 DIAGNOSIS — J019 Acute sinusitis, unspecified: Secondary | ICD-10-CM

## 2015-07-10 MED ORDER — AMOXICILLIN-POT CLAVULANATE 875-125 MG PO TABS
1.0000 | ORAL_TABLET | Freq: Two times a day (BID) | ORAL | Status: DC
Start: 2015-07-10 — End: 2015-12-12

## 2015-07-10 NOTE — Progress Notes (Signed)
Subjective:    Patient ID: Tyler Barton, male    DOB: July 24, 1970, 45 y.o.   MRN: [id number]  HPI Patient here today for possible URI. He has head congestion, cough and drainage. He has drainage sneezing head congestion and cough. This started 3 days ago. He is taking Mucinex and using Flonase. The patient has not had any fever. His ears have been hurting and he does have a cough. The drainage has turned yellow in color and all the congestion is worse in the morning when he arises.      Patient Active Problem List   Diagnosis Date Noted  . IBS (irritable bowel syndrome) 11/21/2014  . Ethmoid sinusitis 11/21/2014   Outpatient Encounter Prescriptions as of 07/10/2015  Medication Sig  . busPIRone (BUSPAR) 5 MG tablet Take 1 tablet (5 mg total) by mouth 2 (two) times daily.  . citalopram (CELEXA) 40 MG tablet Take 40 mg by mouth daily.  . diazepam (VALIUM) 5 MG tablet Take 5 mg by mouth 2 (two) times daily. Reported on 07/10/2015  . fluticasone (FLONASE) 50 MCG/ACT nasal spray Place 2 sprays into both nostrils daily.  . [DISCONTINUED] buPROPion (WELLBUTRIN XL) 150 MG 24 hr tablet Take 150 mg by mouth daily.  Marland Kitchen loratadine (CLARITIN) 10 MG tablet Take 10 mg by mouth daily. Reported on 07/10/2015  . [DISCONTINUED] amoxicillin-clavulanate (AUGMENTIN) 875-125 MG tablet Take 1 tablet by mouth 2 (two) times daily.   No facility-administered encounter medications on file as of 07/10/2015.      Review of Systems  Constitutional: Negative.  Negative for fever.  HENT: Positive for postnasal drip and sneezing. Congestion: mostly head.   Eyes: Negative.   Respiratory: Positive for cough.   Cardiovascular: Negative.   Gastrointestinal: Negative.   Endocrine: Negative.   Genitourinary: Negative.   Musculoskeletal: Negative.   Skin: Negative.   Allergic/Immunologic: Negative.   Neurological: Negative.   Hematological: Negative.   Psychiatric/Behavioral: Negative.        Objective:   Physical  Exam  Constitutional: He is oriented to person, place, and time. He appears well-developed and well-nourished. No distress.  HENT:  Head: Normocephalic and atraumatic.  Right Ear: External ear normal.  Left Ear: External ear normal.  Mouth/Throat: Oropharynx is clear and moist. No oropharyngeal exudate.  Nasal congestion and turbinate swelling bilaterally. All sinuses are tender to palpation.  Eyes: Conjunctivae and EOM are normal. Pupils are equal, round, and reactive to light. Right eye exhibits no discharge. Left eye exhibits no discharge. No scleral icterus.  Neck: Normal range of motion. Neck supple. No thyromegaly present.  No anterior cervical adenopathy  Cardiovascular: Normal rate, regular rhythm and normal heart sounds.   No murmur heard. Pulmonary/Chest: Effort normal and breath sounds normal. No respiratory distress. He has no wheezes. He has no rales. He exhibits no tenderness.  The chest is clear anteriorly and posteriorly with no rales rhonchi or wheezes  Abdominal: He exhibits no mass.  Musculoskeletal: Normal range of motion. He exhibits no edema.  Lymphadenopathy:    He has no cervical adenopathy.  Neurological: He is alert and oriented to person, place, and time.  Skin: Skin is warm and dry. No rash noted.  Psychiatric: He has a normal mood and affect. His behavior is normal. Judgment and thought content normal.  Nursing note and vitals reviewed.  BP 123/76 mmHg  Pulse 77  Temp(Src) 97.9 F (36.6 C) (Oral)  Ht 5\' 9"  (1.753 m)  Wt 214 lb (97.07 kg)  BMI 31.59 kg/m2        Assessment & Plan:  1. Acute rhinosinusitis -Use nasal saline frequently during the day and continue to use Flonase at night 1 spray each nostril -Take Mucinex maximum strength, blue and white in color, 1 twice daily with a large glass of water for cough and congestion - amoxicillin-clavulanate (AUGMENTIN) 875-125 MG tablet; Take 1 tablet by mouth 2 (two) times daily.  Dispense: 20 tablet;  Refill: 0  Patient Instructions  Take antibiotic twice daily with food until completed Drink plenty of fluids and stay well hydrated Use Mucinex maximum strength, blue and white in color, 1 twice daily with a large glass of water for cough and congestion Use nasal saline frequently through the day in each nostril Take Tylenol for aches pains and fever Continue to use Flonase regularly at bedtime 1 spray each nostril   Arrie Senate MD

## 2015-07-10 NOTE — Patient Instructions (Signed)
Take antibiotic twice daily with food until completed Drink plenty of fluids and stay well hydrated Use Mucinex maximum strength, blue and white in color, 1 twice daily with a large glass of water for cough and congestion Use nasal saline frequently through the day in each nostril Take Tylenol for aches pains and fever Continue to use Flonase regularly at bedtime 1 spray each nostril

## 2015-11-11 ENCOUNTER — Other Ambulatory Visit: Payer: Self-pay | Admitting: Physician Assistant

## 2015-12-05 ENCOUNTER — Ambulatory Visit: Payer: Medicare Other | Admitting: Nurse Practitioner

## 2015-12-12 ENCOUNTER — Ambulatory Visit (INDEPENDENT_AMBULATORY_CARE_PROVIDER_SITE_OTHER): Payer: Medicare Other | Admitting: Nurse Practitioner

## 2015-12-12 ENCOUNTER — Encounter: Payer: Self-pay | Admitting: Nurse Practitioner

## 2015-12-12 VITALS — BP 111/75 | HR 69 | Temp 97.8°F | Ht 69.0 in | Wt 198.0 lb

## 2015-12-12 DIAGNOSIS — F411 Generalized anxiety disorder: Secondary | ICD-10-CM

## 2015-12-12 DIAGNOSIS — F329 Major depressive disorder, single episode, unspecified: Secondary | ICD-10-CM | POA: Diagnosis not present

## 2015-12-12 DIAGNOSIS — R5383 Other fatigue: Secondary | ICD-10-CM

## 2015-12-12 DIAGNOSIS — F32A Depression, unspecified: Secondary | ICD-10-CM

## 2015-12-12 MED ORDER — CLONAZEPAM 0.5 MG PO TABS: 0.5000 mg | ORAL_TABLET | Freq: Two times a day (BID) | ORAL | Status: DC | PRN

## 2015-12-12 MED ORDER — CITALOPRAM HYDROBROMIDE 40 MG PO TABS: 40.0000 mg | ORAL_TABLET | Freq: Every day | ORAL | Status: DC

## 2015-12-12 NOTE — Patient Instructions (Signed)
Stress and Stress Management Stress is a normal reaction to life events. It is what you feel when life demands more than you are used to or more than you can handle. Some stress can be useful. For example, the stress reaction can help you catch the last bus of the day, study for a test, or meet a deadline at work. But stress that occurs too often or for too long can cause problems. It can affect your emotional health and interfere with relationships and normal daily activities. Too much stress can weaken your immune system and increase your risk for physical illness. If you already have a medical problem, stress can make it worse. CAUSES  All sorts of life events may cause stress. An event that causes stress for one person may not be stressful for another person. Major life events commonly cause stress. These may be positive or negative. Examples include losing your job, moving into a new home, getting married, having a baby, or losing a loved one. Less obvious life events may also cause stress, especially if they occur day after day or in combination. Examples include working long hours, driving in traffic, caring for children, being in debt, or being in a difficult relationship. SIGNS AND SYMPTOMS Stress may cause emotional symptoms including, the following:  Anxiety. This is feeling worried, afraid, on edge, overwhelmed, or out of control.  Anger. This is feeling irritated or impatient.  Depression. This is feeling sad, down, helpless, or guilty.  Difficulty focusing, remembering, or making decisions. Stress may cause physical symptoms, including the following:   Aches and pains. These may affect your head, neck, back, stomach, or other areas of your body.  Tight muscles or clenched jaw.  Low energy or trouble sleeping. Stress may cause unhealthy behaviors, including the following:   Eating to feel better (overeating) or skipping meals.  Sleeping too little, too much, or both.  Working  too much or putting off tasks (procrastination).  Smoking, drinking alcohol, or using drugs to feel better. DIAGNOSIS  Stress is diagnosed through an assessment by your health care provider. Your health care provider will ask questions about your symptoms and any stressful life events.Your health care provider will also ask about your medical history and may order blood tests or other tests. Certain medical conditions and medicine can cause physical symptoms similar to stress. Mental illness can cause emotional symptoms and unhealthy behaviors similar to stress. Your health care provider may refer you to a mental health professional for further evaluation.  TREATMENT  Stress management is the recommended treatment for stress.The goals of stress management are reducing stressful life events and coping with stress in healthy ways.  Techniques for reducing stressful life events include the following:  Stress identification. Self-monitor for stress and identify what causes stress for you. These skills may help you to avoid some stressful events.  Time management. Set your priorities, keep a calendar of events, and learn to say "no." These tools can help you avoid making too many commitments. Techniques for coping with stress include the following:  Rethinking the problem. Try to think realistically about stressful events rather than ignoring them or overreacting. Try to find the positives in a stressful situation rather than focusing on the negatives.  Exercise. Physical exercise can release both physical and emotional tension. The key is to find a form of exercise you enjoy and do it regularly.  Relaxation techniques. These relax the body and mind. Examples include yoga, meditation, tai chi, biofeedback, deep  breathing, progressive muscle relaxation, listening to music, being out in nature, journaling, and other hobbies. Again, the key is to find one or more that you enjoy and can do  regularly.  Healthy lifestyle. Eat a balanced diet, get plenty of sleep, and do not smoke. Avoid using alcohol or drugs to relax.  Strong support network. Spend time with family, friends, or other people you enjoy being around.Express your feelings and talk things over with someone you trust. Counseling or talktherapy with a mental health professional may be helpful if you are having difficulty managing stress on your own. Medicine is typically not recommended for the treatment of stress.Talk to your health care provider if you think you need medicine for symptoms of stress. HOME CARE INSTRUCTIONS  Keep all follow-up visits as directed by your health care provider.  Take all medicines as directed by your health care provider. SEEK MEDICAL CARE IF:  Your symptoms get worse or you start having new symptoms.  You feel overwhelmed by your problems and can no longer manage them on your own. SEEK IMMEDIATE MEDICAL CARE IF:  You feel like hurting yourself or someone else.   This information is not intended to replace advice given to you by your health care provider. Make sure you discuss any questions you have with your health care provider.   Document Released: 12/16/2000 Document Revised: 07/13/2014 Document Reviewed: 02/14/2013 Elsevier Interactive Patient Education 2016 Elsevier Inc.  

## 2015-12-12 NOTE — Progress Notes (Signed)
   Subjective:    Patient ID: Tyler Barton, male    DOB: [birth year], 45 y.o.   MRN: [id number]  HPI Patient comes in today to discuss anxiety and depression. He has been under a lot of stress lately with marital problems. He is thinking about moving out from his current home. His wife says that she would like to try to work things out. So he is currently trying to decide what to do. He feels like he needs something to keep him calm and from worrying so much. He is currently on celexa which he says helps. He is also on buspar which he says is not helping at all to calm him down. Has counseling with faith and family.  Depression screen [facility] 2/9 12/12/2015 11/21/2014 08/07/2013  Decreased Interest 1 2 2   Down, Depressed, Hopeless 1 1 2   PHQ - 2 Score 2 3 4   Altered sleeping 0 1 0  Tired, decreased energy 2 3 3   Change in appetite 3 3 1   Feeling bad or failure about yourself  1 0 1  Trouble concentrating 0 0 1  Moving slowly or fidgety/restless 0 0 2  Suicidal thoughts 0 0 0  PHQ-9 Score 8 10 12     GAD 7 : Generalized Anxiety Score 12/12/2015  Nervous, Anxious, on Edge 2  Control/stop worrying 2  Worry too much - different things 2  Trouble relaxing 1  Restless 1  Easily annoyed or irritable 2  Afraid - awful might happen 1  Total GAD 7 Score 11  Anxiety Difficulty Somewhat difficult        Review of Systems  Constitutional: Negative.   HENT: Negative.   Respiratory: Negative.   Cardiovascular: Negative.   Genitourinary: Negative.   Neurological: Negative.   Psychiatric/Behavioral: Negative.   All other systems reviewed and are negative.      Objective:   Physical Exam  Constitutional: He is oriented to person, place, and time. He appears well-developed and well-nourished. No distress.  Cardiovascular: Normal rate, regular rhythm and normal heart sounds.   Pulmonary/Chest: Effort normal and breath sounds normal.  Neurological: He is alert and oriented to person, place, and  time.  Skin: Skin is warm.  Psychiatric: He has a normal mood and affect. His behavior is normal. Judgment and thought content normal.  Very talkative Good eye contact   BP 111/75 mmHg  Pulse 69  Temp(Src) 97.8 F (36.6 C) (Oral)  Ht 5\' 9"  (1.753 m)  Wt 198 lb (89.812 kg)  BMI 29.23 kg/m2     Assessment & Plan:  1. Depression Encouraged to continue counseling - citalopram (CELEXA) 40 MG tablet; Take 1 tablet (40 mg total) by mouth daily.  Dispense: 30 tablet; Refill: 5  2. GAD (generalized anxiety disorder) Stress management Alternate valium and klonopin QOD for a week then switch to klonopin - clonazePAM (KLONOPIN) 0.5 MG tablet; Take 1 tablet (0.5 mg total) by mouth 2 (two) times daily as needed for anxiety.  Dispense: 60 tablet; Refill: 1  3. Fatigue due to depression Labs pending-  Patient will RTO for testing - Testosterone,Free and Total; Future  RTO in 3 months follow up  [place], FNP

## 2015-12-14 ENCOUNTER — Telehealth: Payer: Self-pay | Admitting: Family Medicine

## 2015-12-14 NOTE — Telephone Encounter (Signed)
Received call from the pharmacy. Patient is there to fill his Klonopin prescription.  He explained that he got #60 five mg Valium 8 days ago from another provider. They wanted to know if we should fill the Klonopin anyway.  Reviewed MMM's note, They discussed transitioning. The pharmacist will fill the Klonopin.   Tyler Apple, MD [facility] 12/14/2015, 10:27 AM

## 2015-12-16 ENCOUNTER — Other Ambulatory Visit: Payer: Self-pay

## 2015-12-16 MED ORDER — FLUTICASONE PROPIONATE 50 MCG/ACT NA SUSP: NASAL | Status: DC

## 2016-02-04 ENCOUNTER — Telehealth: Payer: Self-pay | Admitting: Family Medicine

## 2016-02-04 DIAGNOSIS — M25569 Pain in unspecified knee: Secondary | ICD-10-CM

## 2016-02-06 NOTE — Telephone Encounter (Signed)
Patient aware.

## 2016-02-17 ENCOUNTER — Telehealth: Payer: Self-pay | Admitting: Nurse Practitioner

## 2016-02-17 NOTE — Telephone Encounter (Signed)
ntbs TO DISCUSS

## 2016-02-17 NOTE — Telephone Encounter (Signed)
Patient states that the last 3 weeks he has been more irritable and will stay mad for hours. Patient thinks its his clonazepam 0.5mg  that needs to be changed. Patient states that it is giving him more energy but also making him more irritable. Patient would like to know if we can change his dose or medication. Advised patient you might want him to come in and he was fine with that. Last filled 6/8 #60 with one refill. Patient has enough for 6 more days. Call on cell number [phone]. Please advise and sent back to the pools.

## 2016-02-20 ENCOUNTER — Ambulatory Visit (INDEPENDENT_AMBULATORY_CARE_PROVIDER_SITE_OTHER): Payer: Medicare Other | Admitting: Nurse Practitioner

## 2016-02-20 ENCOUNTER — Encounter: Payer: Self-pay | Admitting: Nurse Practitioner

## 2016-02-20 VITALS — BP 112/71 | HR 58 | Temp 97.6°F | Ht 69.0 in | Wt 190.0 lb

## 2016-02-20 DIAGNOSIS — F411 Generalized anxiety disorder: Secondary | ICD-10-CM

## 2016-02-20 MED ORDER — ALPRAZOLAM 0.5 MG PO TABS: 0.5000 mg | ORAL_TABLET | Freq: Two times a day (BID) | ORAL | 1 refills | Status: DC | PRN

## 2016-02-20 NOTE — Progress Notes (Signed)
   Subjective:    Patient ID: Tyler Barton, male    DOB: Feb 04, 1971, 45 y.o.   MRN: [id number]  HPI  Patient in today to discuss his anxiety- he has been having marital problems for awhile- he is currently on celexa 40mg  daily and we started Klonopin 0.5mg  BID at last visit. He thinks that it has caused him to be angry. Wyatt Mage that he has a very short fuse and when he gets mad he stays made for hours and won't speak to anyone. GAD 7 : Generalized Anxiety Score 02/20/2016 12/12/2015  Nervous, Anxious, on Edge 1 2  Control/stop worrying 3 2  Worry too much - different things 2 2  Trouble relaxing 2 1  Restless 2 1  Easily annoyed or irritable 3 2  Afraid - awful might happen 0 1  Total GAD 7 Score 13 11  Anxiety Difficulty Very difficult Somewhat difficult    * wants testosterone level checked  Review of Systems  Constitutional: Negative.   HENT: Negative.   Respiratory: Negative.   Cardiovascular: Negative.   Genitourinary: Negative.   Neurological: Negative.   Psychiatric/Behavioral: Negative.   All other systems reviewed and are negative.      Objective:   Physical Exam  Constitutional: He is oriented to person, place, and time. He appears well-developed and well-nourished. No distress.  Cardiovascular: Normal rate, regular rhythm and normal heart sounds.   Pulmonary/Chest: Effort normal and breath sounds normal.  Neurological: He is alert and oriented to person, place, and time.  Skin: Skin is warm.  Psychiatric: He has a normal mood and affect. His behavior is normal. Judgment and thought content normal.   BP 112/71   Pulse (!) 58   Temp 97.6 F (36.4 C) (Oral)   Ht 5\' 9"  (1.753 m)   Wt 190 lb (86.2 kg)   BMI 28.06 kg/m         Assessment & Plan:  1. GAD (generalized anxiety disorder) Stop klonopin- change to xanax BID prn Stress management Follow up in 2 months - Testosterone,Free and Total  Meds ordered this encounter  Medications  . ALPRAZolam (XANAX)  0.5 MG tablet    Sig: Take 1 tablet (0.5 mg total) by mouth 2 (two) times daily as needed for anxiety.    Dispense:  60 tablet    Refill:  1    Order Specific Question:   Supervising Provider    Answer:   Evette Doffing, CAROL L [[id number]]   Orders Placed This Encounter  Procedures  . Testosterone,Free and Total     Mary-Margaret Hassell Done, FNP

## 2016-02-20 NOTE — Patient Instructions (Signed)
Anger Management °Anger is a normal human emotion. However, anger can range from mild irritation to rage. When your anger becomes harmful to yourself or others, it is unhealthy anger.  °CAUSES  °There are many reasons for unhealthy anger. Many people learn how to express anger from observing how their family expressed anger. In troubled, chaotic, or abusive families, anger can be expressed as rage or even violence. Children can grow up never learning how healthy anger can be expressed. Factors that contribute to unhealthy anger include:  °· Drug or alcohol abuse. °· Post-traumatic stress disorder. °· Traumatic brain injury. °COMPLICATIONS  °People with unhealthy anger tend to overreact and retaliate against a real or imagined threat. The need to retaliate can turn into violence or verbal abuse against another person. Chronic anger can lead to health problems, such as hypertension, high blood pressure, and depression. °TREATMENT  °Exercising, relaxing, meditating, or writing out your feelings all can be beneficial in managing moderate anger. For unhealthy anger, the following methods may be used: °· Cognitive-behavioral counseling (learning skills to change the thoughts that influence your mood). °· Relaxation training. °· Interpersonal counseling. °· Assertive communication skills. °· Medication. °  °This information is not intended to replace advice given to you by your health care provider. Make sure you discuss any questions you have with your health care provider. °  °Document Released: 04/19/2007 Document Revised: 09/14/2011 Document Reviewed: 08/28/2010 °Elsevier Interactive Patient Education ©2016 Elsevier Inc. ° °

## 2016-02-21 LAB — TESTOSTERONE,FREE AND TOTAL
Testosterone, Free: 15.9 pg/mL (ref 6.8–21.5)
Testosterone: 490 ng/dL (ref 264–916)

## 2016-02-26 ENCOUNTER — Telehealth: Payer: Self-pay | Admitting: Nurse Practitioner

## 2016-02-26 NOTE — Telephone Encounter (Signed)
No answer no VM

## 2016-02-26 NOTE — Telephone Encounter (Signed)
Please advise 

## 2016-02-26 NOTE — Telephone Encounter (Signed)
No vm

## 2016-02-26 NOTE — Telephone Encounter (Signed)
Patient was agreeable to suggestions and will call back if symptoms worsen or persist.

## 2016-02-26 NOTE — Telephone Encounter (Signed)
Patient states that he has been having nasal congestion, facial pain, and headache for the last 3 days. Patient would like an rx called in to [facility] in [place]. Covering PCP, please advise.

## 2016-02-26 NOTE — Telephone Encounter (Signed)
If only ongoing for 3 days likely due to a virus, bacterial infections that antibiotics would help with take longer to develop. He should use flonase, netipot or similar sinus rinse 2-3 times a day to help with symptoms. Take cetrizine 10mg  or other allergy pill daily can help with the swelling and inflammation in sinuses from virus. Viruses can last 7-10 days and cause green, yellow sinus drainage.  If he is having temperature over 101 he should be seen, if not getting better midweek next week should be seen.

## 2016-02-27 DIAGNOSIS — F419 Anxiety disorder, unspecified: Secondary | ICD-10-CM | POA: Diagnosis not present

## 2016-02-27 DIAGNOSIS — Z79899 Other long term (current) drug therapy: Secondary | ICD-10-CM | POA: Diagnosis not present

## 2016-02-27 DIAGNOSIS — J018 Other acute sinusitis: Secondary | ICD-10-CM | POA: Diagnosis not present

## 2016-02-27 DIAGNOSIS — J019 Acute sinusitis, unspecified: Secondary | ICD-10-CM | POA: Diagnosis not present

## 2016-03-05 DIAGNOSIS — R0602 Shortness of breath: Secondary | ICD-10-CM | POA: Diagnosis not present

## 2016-03-05 DIAGNOSIS — F411 Generalized anxiety disorder: Secondary | ICD-10-CM | POA: Diagnosis not present

## 2016-03-05 DIAGNOSIS — Z79899 Other long term (current) drug therapy: Secondary | ICD-10-CM | POA: Diagnosis not present

## 2016-03-05 DIAGNOSIS — F410 Panic disorder [episodic paroxysmal anxiety] without agoraphobia: Secondary | ICD-10-CM | POA: Diagnosis not present

## 2016-03-06 ENCOUNTER — Telehealth: Payer: Self-pay | Admitting: Nurse Practitioner

## 2016-03-06 DIAGNOSIS — R200 Anesthesia of skin: Secondary | ICD-10-CM | POA: Diagnosis not present

## 2016-03-06 DIAGNOSIS — R209 Unspecified disturbances of skin sensation: Secondary | ICD-10-CM | POA: Diagnosis not present

## 2016-03-06 MED ORDER — HYDROXYZINE HCL 50 MG PO TABS: 50.0000 mg | ORAL_TABLET | Freq: Every day | ORAL | 1 refills | Status: DC

## 2016-03-06 NOTE — Telephone Encounter (Signed)
Patient aware of CT scan results and rx sent to pharmacy.

## 2016-03-06 NOTE — Telephone Encounter (Signed)
Tried to call patient to see what dose of hydroxyzine they gave him but there was no answer- I do not have access to [place] records to find out.

## 2016-03-13 ENCOUNTER — Encounter: Payer: Self-pay | Admitting: Nurse Practitioner

## 2016-03-13 ENCOUNTER — Ambulatory Visit (INDEPENDENT_AMBULATORY_CARE_PROVIDER_SITE_OTHER): Payer: Medicare Other | Admitting: Nurse Practitioner

## 2016-03-13 VITALS — BP 120/80 | HR 75 | Temp 97.4°F | Ht 69.0 in | Wt 196.0 lb

## 2016-03-13 DIAGNOSIS — F410 Panic disorder [episodic paroxysmal anxiety] without agoraphobia: Secondary | ICD-10-CM | POA: Diagnosis not present

## 2016-03-13 MED ORDER — DIAZEPAM 5 MG PO TABS: 5.0000 mg | ORAL_TABLET | Freq: Two times a day (BID) | ORAL | 1 refills | Status: DC | PRN

## 2016-03-13 NOTE — Progress Notes (Signed)
   Subjective:    Patient ID: Tyler Barton, male    DOB: [birth year], 45 y.o.   MRN: [id number]  HPI Patient has been seen several times recently for anxiety and depression.Last visit was 02/04/16- at that time he was on klonopin which he said made him angry- I reluctantly changed him to xanax BID. He is also on celexa daily which he had no complaints about at last visit. Today he says that he had to go to the ER due to numbness in chest wall and face. They did a CT scan and he was told was normal. The ER thinks symptoms were coming from panic attack. DOes not know what could have triggered attack. He was on valium in the past and seems to think that that helped with panic attacks better then xanax.    Review of Systems  Constitutional: Negative.   HENT: Negative.   Respiratory: Negative.   Cardiovascular: Negative.   Genitourinary: Negative.   Neurological: Negative.   Psychiatric/Behavioral: Negative for behavioral problems and suicidal ideas. The patient is nervous/anxious.   All other systems reviewed and are negative.      Objective:   Physical Exam  Constitutional: He is oriented to person, place, and time. He appears well-developed and well-nourished. No distress.  Cardiovascular: Normal rate and normal heart sounds.   Pulmonary/Chest: Effort normal and breath sounds normal.  Neurological: He is alert and oriented to person, place, and time.  Skin: Skin is warm.  Psychiatric: He has a normal mood and affect. His behavior is normal. Judgment and thought content normal.  Very talkative Good eye contact   BP 120/80   Pulse 75   Temp 97.4 F (36.3 C) (Oral)   Ht 5\' 9"  (1.753 m)   Wt 196 lb (88.9 kg)   BMI 28.94 kg/m      Assessment & Plan:   1. Panic attacks    Meds ordered this encounter  Medications  . diazepam (VALIUM) 5 MG tablet    Sig: Take 1 tablet (5 mg total) by mouth every 12 (twelve) hours as needed for anxiety.    Dispense:  60 tablet    Refill:  1   Cancel future xanax refills    Order Specific Question:   Supervising Provider    Answer:   Eustaquio Maize [[id number]]   Stop xanax Do not take diazepam with atarax Stress management RTO in 3 months  Mary-Margaret Hassell Done, FNP

## 2016-03-13 NOTE — Patient Instructions (Signed)

## 2016-03-19 ENCOUNTER — Ambulatory Visit: Payer: Medicare Other | Admitting: Nurse Practitioner

## 2016-04-14 ENCOUNTER — Encounter: Payer: Self-pay | Admitting: Family Medicine

## 2016-04-14 ENCOUNTER — Ambulatory Visit (INDEPENDENT_AMBULATORY_CARE_PROVIDER_SITE_OTHER): Payer: Medicare Other | Admitting: Family Medicine

## 2016-04-14 VITALS — BP 127/88 | HR 64 | Temp 97.4°F | Ht 69.0 in | Wt 199.0 lb

## 2016-04-14 DIAGNOSIS — J40 Bronchitis, not specified as acute or chronic: Secondary | ICD-10-CM

## 2016-04-14 DIAGNOSIS — J329 Chronic sinusitis, unspecified: Secondary | ICD-10-CM

## 2016-04-14 MED ORDER — AMOXICILLIN-POT CLAVULANATE 875-125 MG PO TABS: 1.0000 | ORAL_TABLET | Freq: Two times a day (BID) | ORAL | 0 refills | Status: DC

## 2016-04-14 MED ORDER — BETAMETHASONE SOD PHOS & ACET 6 (3-3) MG/ML IJ SUSP
6.0000 mg | Freq: Once | INTRAMUSCULAR | Status: AC
  Administered 2016-04-14: 6 mg via INTRAMUSCULAR

## 2016-04-14 NOTE — Progress Notes (Signed)
Subjective:  Patient ID: Tyler Barton, male    DOB: 05-Sep-1970  Age: 45 y.o. MRN: [id number]  CC: Sinusitis (pt here today c/o sinus congestion, cough, sore throat and headaches.)   HPI Tyler Barton presents for Patient presents with upper respiratory congestion. Rhinorrhea that is frequently purulent. There is moderate sore throat. Patient reports coughing frequently as well.-colored/purulent sputum noted. There is no fever no chills no sweats. The patient denies being short of breath. Onset was 3-5 days ago. Gradually worsening in spite of home remedies.    History Tyler Barton has a past medical history of Anxiety and DDD (degenerative disc disease).   He has a past surgical history that includes Knee arthroscopy (Left, 1993) and Lipoma excision.   His family history includes Anxiety disorder in his father and mother.He reports that he has never smoked. He has never used smokeless tobacco. He reports that he drinks alcohol. He reports that he does not use drugs.    ROS Review of Systems  Constitutional: Negative for activity change, appetite change, chills and fever.  HENT: Positive for congestion, postnasal drip, rhinorrhea and sinus pressure. Negative for ear discharge, ear pain, hearing loss, nosebleeds, sneezing and trouble swallowing.   Respiratory: Negative for chest tightness and shortness of breath.   Cardiovascular: Negative for chest pain and palpitations.  Skin: Negative for rash.    Objective:  BP 127/88   Pulse 64   Temp 97.4 F (36.3 C) (Oral)   Ht 5\' 9"  (1.753 m)   Wt 199 lb (90.3 kg)   BMI 29.39 kg/m   BP Readings from Last 3 Encounters:  04/14/16 127/88  03/13/16 120/80  02/20/16 112/71    Wt Readings from Last 3 Encounters:  04/14/16 199 lb (90.3 kg)  03/13/16 196 lb (88.9 kg)  02/20/16 190 lb (86.2 kg)     Physical Exam  Constitutional: He appears well-developed and well-nourished.  HENT:  Head: Normocephalic and atraumatic.  Right Ear:  Tympanic membrane and external ear normal. No decreased hearing is noted.  Left Ear: Tympanic membrane and external ear normal. No decreased hearing is noted.  Nose: Mucosal edema present. Right sinus exhibits no frontal sinus tenderness. Left sinus exhibits no frontal sinus tenderness.  Mouth/Throat: No oropharyngeal exudate or posterior oropharyngeal erythema.  Neck: No Brudzinski's sign noted.  Pulmonary/Chest: No respiratory distress. He has wheezes (bronchoalveolar changes).  Lymphadenopathy:       Head (right side): No preauricular adenopathy present.       Head (left side): No preauricular adenopathy present.       Right cervical: No superficial cervical adenopathy present.      Left cervical: No superficial cervical adenopathy present.     Lab Results  Component Value Date   WBC 7.5 08/28/2013   HGB 14.6 08/28/2013   HCT 46.4 08/28/2013   GLUCOSE 104 (H) 08/28/2013   CHOL 195 08/28/2013   TRIG 141 08/28/2013   HDL 44 08/28/2013   LDLCALC 123 (H) 08/28/2013   ALT 33 08/28/2013   AST 24 08/28/2013   NA 142 08/28/2013   K 4.4 08/28/2013   CL 102 08/28/2013   CREATININE 1.09 08/28/2013   BUN 9 08/28/2013   CO2 24 08/28/2013   TSH 1.210 08/28/2013   PSA 1.0 08/28/2013    No results found.  Assessment & Plan:   Tyler Barton was seen today for sinusitis.  Diagnoses and all orders for this visit:  Sinobronchitis -     betamethasone acetate-betamethasone sodium phosphate (  CELESTONE) injection 6 mg; Inject 1 mL (6 mg total) into the muscle once.  Other orders -     amoxicillin-clavulanate (AUGMENTIN) 875-125 MG tablet; Take 1 tablet by mouth 2 (two) times daily. Take all of this medication      I have discontinued Tyler Barton's hydrOXYzine. I am also having him start on amoxicillin-clavulanate. Additionally, I am having him maintain his loratadine, citalopram, fluticasone, and diazepam. We will continue to administer betamethasone acetate-betamethasone sodium  phosphate.  Meds ordered this encounter  Medications  . amoxicillin-clavulanate (AUGMENTIN) 875-125 MG tablet    Sig: Take 1 tablet by mouth 2 (two) times daily. Take all of this medication    Dispense:  20 tablet    Refill:  0  . betamethasone acetate-betamethasone sodium phosphate (CELESTONE) injection 6 mg     Follow-up: Return if symptoms worsen or fail to improve.  Claretta Fraise, M.D.

## 2016-04-21 ENCOUNTER — Ambulatory Visit: Payer: Medicare Other | Admitting: Nurse Practitioner

## 2016-05-13 ENCOUNTER — Encounter: Payer: Self-pay | Admitting: Nurse Practitioner

## 2016-05-13 ENCOUNTER — Ambulatory Visit (INDEPENDENT_AMBULATORY_CARE_PROVIDER_SITE_OTHER): Payer: Medicare Other | Admitting: Nurse Practitioner

## 2016-05-13 DIAGNOSIS — F410 Panic disorder [episodic paroxysmal anxiety] without agoraphobia: Secondary | ICD-10-CM | POA: Diagnosis not present

## 2016-05-13 DIAGNOSIS — F3342 Major depressive disorder, recurrent, in full remission: Secondary | ICD-10-CM

## 2016-05-13 MED ORDER — FLUTICASONE PROPIONATE 50 MCG/ACT NA SUSP
NASAL | 5 refills | Status: DC
Start: 2016-05-13 — End: 2016-11-20

## 2016-05-13 MED ORDER — CITALOPRAM HYDROBROMIDE 40 MG PO TABS: 40.0000 mg | ORAL_TABLET | Freq: Every day | ORAL | 5 refills | Status: DC

## 2016-05-13 MED ORDER — DIAZEPAM 5 MG PO TABS: 5.0000 mg | ORAL_TABLET | Freq: Two times a day (BID) | ORAL | 2 refills | Status: DC | PRN

## 2016-05-13 NOTE — Patient Instructions (Signed)
Stress and Stress Management Stress is a normal reaction to life events. It is what you feel when life demands more than you are used to or more than you can handle. Some stress can be useful. For example, the stress reaction can help you catch the last bus of the day, study for a test, or meet a deadline at work. But stress that occurs too often or for too long can cause problems. It can affect your emotional health and interfere with relationships and normal daily activities. Too much stress can weaken your immune system and increase your risk for physical illness. If you already have a medical problem, stress can make it worse. CAUSES  All sorts of life events may cause stress. An event that causes stress for one person may not be stressful for another person. Major life events commonly cause stress. These may be positive or negative. Examples include losing your job, moving into a new home, getting married, having a baby, or losing a loved one. Less obvious life events may also cause stress, especially if they occur day after day or in combination. Examples include working long hours, driving in traffic, caring for children, being in debt, or being in a difficult relationship. SIGNS AND SYMPTOMS Stress may cause emotional symptoms including, the following:  Anxiety. This is feeling worried, afraid, on edge, overwhelmed, or out of control.  Anger. This is feeling irritated or impatient.  Depression. This is feeling sad, down, helpless, or guilty.  Difficulty focusing, remembering, or making decisions. Stress may cause physical symptoms, including the following:   Aches and pains. These may affect your head, neck, back, stomach, or other areas of your body.  Tight muscles or clenched jaw.  Low energy or trouble sleeping. Stress may cause unhealthy behaviors, including the following:   Eating to feel better (overeating) or skipping meals.  Sleeping too little, too much, or both.  Working  too much or putting off tasks (procrastination).  Smoking, drinking alcohol, or using drugs to feel better. DIAGNOSIS  Stress is diagnosed through an assessment by your health care provider. Your health care provider will ask questions about your symptoms and any stressful life events.Your health care provider will also ask about your medical history and may order blood tests or other tests. Certain medical conditions and medicine can cause physical symptoms similar to stress. Mental illness can cause emotional symptoms and unhealthy behaviors similar to stress. Your health care provider may refer you to a mental health professional for further evaluation.  TREATMENT  Stress management is the recommended treatment for stress.The goals of stress management are reducing stressful life events and coping with stress in healthy ways.  Techniques for reducing stressful life events include the following:  Stress identification. Self-monitor for stress and identify what causes stress for you. These skills may help you to avoid some stressful events.  Time management. Set your priorities, keep a calendar of events, and learn to say "no." These tools can help you avoid making too many commitments. Techniques for coping with stress include the following:  Rethinking the problem. Try to think realistically about stressful events rather than ignoring them or overreacting. Try to find the positives in a stressful situation rather than focusing on the negatives.  Exercise. Physical exercise can release both physical and emotional tension. The key is to find a form of exercise you enjoy and do it regularly.  Relaxation techniques. These relax the body and mind. Examples include yoga, meditation, tai chi, biofeedback, deep  breathing, progressive muscle relaxation, listening to music, being out in nature, journaling, and other hobbies. Again, the key is to find one or more that you enjoy and can do  regularly.  Healthy lifestyle. Eat a balanced diet, get plenty of sleep, and do not smoke. Avoid using alcohol or drugs to relax.  Strong support network. Spend time with family, friends, or other people you enjoy being around.Express your feelings and talk things over with someone you trust. Counseling or talktherapy with a mental health professional may be helpful if you are having difficulty managing stress on your own. Medicine is typically not recommended for the treatment of stress.Talk to your health care provider if you think you need medicine for symptoms of stress. HOME CARE INSTRUCTIONS  Keep all follow-up visits as directed by your health care provider.  Take all medicines as directed by your health care provider. SEEK MEDICAL CARE IF:  Your symptoms get worse or you start having new symptoms.  You feel overwhelmed by your problems and can no longer manage them on your own. SEEK IMMEDIATE MEDICAL CARE IF:  You feel like hurting yourself or someone else.   This information is not intended to replace advice given to you by your health care provider. Make sure you discuss any questions you have with your health care provider.   Document Released: 12/16/2000 Document Revised: 07/13/2014 Document Reviewed: 02/14/2013 Elsevier Interactive Patient Education 2016 Elsevier Inc.  

## 2016-05-13 NOTE — Progress Notes (Signed)
   Subjective:    Patient ID: Tyler Barton, male    DOB: [birth year], 45 y.o.   MRN: [id number]  HPI Patient comes in today for follow up of anxiety. He was seen on 03/13/16 with worsening panic attacks. AT that time he said that Klonipin was making him angry and more anxious, so he was changed to xanax . He thinks that the valium worked much better for him. He was given rx for valium 5mg  to take BID. Only has an occassional panic attack.     Review of Systems  Constitutional: Negative.   HENT: Negative.   Respiratory: Negative.   Cardiovascular: Negative.   Genitourinary: Negative.   Neurological: Negative.   Psychiatric/Behavioral: Negative.   All other systems reviewed and are negative.      Objective:   Physical Exam  Constitutional: He is oriented to person, place, and time. He appears well-developed and well-nourished. No distress.  Cardiovascular: Normal rate, regular rhythm and normal heart sounds.   Pulmonary/Chest: Effort normal and breath sounds normal.  Neurological: He is alert and oriented to person, place, and time.  Skin: Skin is warm.  Psychiatric: He has a normal mood and affect. His behavior is normal. Judgment and thought content normal.   BP 108/68   Pulse 67   Temp 98.1 F (36.7 C) (Oral)   Ht 5\' 9"  (1.753 m)   Wt 207 lb (93.9 kg)   BMI 30.57 kg/m         Assessment & Plan:  1. Recurrent major depressive disorder, in full remission ([place]) Exercise 2-3 x a week - citalopram (CELEXA) 40 MG tablet; Take 1 tablet (40 mg total) by mouth daily.  Dispense: 30 tablet; Refill: 5  2. Panic attacks Stress management - diazepam (VALIUM) 5 MG tablet; Take 1 tablet (5 mg total) by mouth every 12 (twelve) hours as needed for anxiety.  Dispense: 60 tablet; Refill: 2   Mary-Margaret Hassell Done, FNP

## 2016-05-14 ENCOUNTER — Ambulatory Visit: Payer: Medicare Other | Admitting: Nurse Practitioner

## 2016-06-16 ENCOUNTER — Telehealth: Payer: Self-pay | Admitting: Nurse Practitioner

## 2016-06-16 DIAGNOSIS — Z630 Problems in relationship with spouse or partner: Secondary | ICD-10-CM

## 2016-06-16 NOTE — Telephone Encounter (Signed)
Give list of counselors- he will need to make his own appointment

## 2016-06-16 NOTE — Telephone Encounter (Signed)
Please review and advise.

## 2016-06-26 ENCOUNTER — Telehealth (HOSPITAL_COMMUNITY): Payer: Self-pay | Admitting: *Deleted

## 2016-06-26 NOTE — Telephone Encounter (Signed)
Phone call, no answer.

## 2016-06-26 NOTE — Telephone Encounter (Signed)
Phone call, no voice mail.

## 2016-07-08 ENCOUNTER — Encounter (HOSPITAL_COMMUNITY): Payer: Self-pay | Admitting: Psychiatry

## 2016-07-08 ENCOUNTER — Ambulatory Visit (INDEPENDENT_AMBULATORY_CARE_PROVIDER_SITE_OTHER): Payer: Medicare Other | Admitting: Psychiatry

## 2016-07-08 DIAGNOSIS — F4323 Adjustment disorder with mixed anxiety and depressed mood: Secondary | ICD-10-CM

## 2016-07-09 NOTE — Progress Notes (Signed)
Comprehensive Clinical Assessment (CCA) Note  07/09/2016 MERITT CERBONE [id number]  Visit Diagnosis:      ICD-9-CM ICD-10-CM   1. Adjustment disorder with mixed anxiety and depressed mood 309.28 F43.23       CCA Part One  Part One has been completed on paper by the patient.  (See scanned document in Chart Review)  CCA Part Two A  Intake/Chief Complaint:  CCA Intake With Chief Complaint CCA Part Two Date: 07/08/16 CCA Part Two Time: 1028 Chief Complaint/Presenting Problem: On Novermber 81, me and my wife had conversation and she admitted she was involved with someone else. This caused my anxiety and panic attacks to increase. This devastated me.  Wife has discontinued seeing person and wants to fix marriage. Patients Currently Reported Symptoms/Problems: panic attacks, anxiety, poor concentration, memory difficulty, difficulty staying asleep, excessive woryying, low energy, loss of interest, confusion , decreased appetite Type of Services Patient Feels Are Needed: Individual therapy Initial Clinical Notes/Concerns: Patient presents with a long standing history of panic attacks and anxiety that began with when he was 46 years old. His ex brother-in-law with whom was very close died. 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  Patient has had no psychiatric hospitalizations and no previous involvement in outpatient therapy. He has been taking medicatioin for anxiety since that time as prescribed by PCP. He reports having to go on disability due to the panic attacks.   Mental Health Symptoms Depression:  Depression: Increase/decrease in appetite, Sleep (too much or little), Tearfulness, Change in energy/activity  Mania:  Mania: N/A  Anxiety:   Anxiety: Difficulty concentrating, Irritability, Fatigue, Restlessness, Sleep, Tension, Worrying  Psychosis:  Psychosis: N/A  Trauma:  Trauma: N/A  Obsessions:  Obsessions: N/A  Compulsions:  Compulsions: N/A   Inattention:    Hyperactivity/Impulsivity:  Hyperactivity/Impulsivity: N/A  Oppositional/Defiant Behaviors:  Oppositional/Defiant Behaviors: N/A  Borderline Personality:  Emotional Irregularity: N/A  Other Mood/Personality Symptoms:      Mental Status Exam Appearance and self-care  Stature:  Stature: Average  Weight:  Weight: Thin  Clothing:  Clothing: Casual  Grooming:  Grooming: Normal  Cosmetic use:  Cosmetic Use: None  Posture/gait:  Posture/Gait: Normal  Motor activity:  Motor Activity: Not Remarkable  Sensorium  Attention:  Attention: Normal  Concentration:  Concentration: Anxiety interferes  Orientation:  Orientation: Object, Place, Situation, Person, Time  Recall/memory:    Affect and Mood  Affect:  Affect: Depressed, Tearful, Anxious  Mood:  Mood: Anxious, Depressed  Relating  Eye contact:  Eye Contact: None  Facial expression:  Facial Expression: Responsive, Sad  Attitude toward examiner:  Attitude Toward Examiner: Cooperative  Thought and Language  Speech flow: Speech Flow: Normal  Thought content:  Thought Content: Appropriate to mood and circumstances  Preoccupation:  Preoccupations: Ruminations  Hallucinations:  Hallucinations:  (None)  Organization:     Transport planner of Knowledge:  Fund of Knowledge: Average  Intelligence:  Intelligence: Average  Abstraction:    Judgement:  Judgement: Normal  Reality Testing:  Reality Testing: Realistic  Insight:  Insight: Good  Decision Making:  Decision Making: Only simple  Social Functioning  Social Maturity:  Social Maturity: Isolates  Social Judgement:  Social Judgement: Normal  Stress  Stressors:  Stressors: Family conflict  Coping Ability:  Coping Ability: English as a second language teacher Deficits:    Supports:     Family and Psychosocial History: Family history Marital status: Married (Been married twice. First marriage ended after  2 years due to issues with in-laws and patient's infidelity. Patient and  current wife have been married for 15 years.  Patient , his wife, and his two stepsons , ages 28 and 9, reside in [place]. ) Number of Years Married: 73 What types of issues is patient dealing with in the relationship?: wife's recent marital infidelity, trust issues.  Are you sexually active?: No What is your sexual orientation?: Heterosexual  Has your sexual activity been affected by drugs, alcohol, medication, or emotional stress?: emotional stress Does patient have children?: Yes How many children?: 4 (Patient had 4 biological children and 2 stepchildren. ) How is patient's relationship with their children?: Good relationship with stepchildren, good relationship with biological children  Childhood History:  Childhood History By whom was/is the patient raised?: Both parents (Parents separated when patient was 27 years old and patient resided with dad. He had regular contact with mother. ) Additional childhood history information: Patient was born and raised in [place], [place] Description of patient's relationship with caregiver when they were a child: Dad was an alcoholic , pt reports good relationship with him and feeling sorry for him when parents separated. He reports good relationship with mother, wasn't as close to her as he was to his dady.  Patient's description of current relationship with people who raised him/her: Good with both parents. How were you disciplined when you got in trouble as a child/adolescent?: spankings, write sentences, lost privileges Does patient have siblings?: Yes Number of Siblings: 3 (Patient is 3rd of 4 siblings. ) Description of patient's current relationship with siblings: Good  Did patient suffer any verbal/emotional/physical/sexual abuse as a child?: No Did patient suffer from severe childhood neglect?: No Has patient ever been sexually abused/assaulted/raped as an adolescent or adult?: No Was the patient ever a victim of a crime or a disaster?:  No Witnessed domestic violence?: No Has patient been effected by domestic violence as an adult?: No  CCA Part Two B  Employment/Work Situation: Employment / Work Situation Employment situation: On disability Why is patient on disability: due to anxiety, panic attacks, degenerative disc disease How long has patient been on disability: since 2013 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 , 13 years Where was the patient employed at that time?: self-employed, 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, hauled products for [employer] Has patient ever been in the [employer]?: No Has patient ever served in combat?: No Did You Receive Any Psychiatric Treatment/Services While in Passenger transport manager?: No Are There Guns or Other Weapons in [place]?: No  Education: Education Last Grade Completed: 10 Did Teacher, adult education From [employer]?: No Did You Nutritional therapist?: No Did Heritage manager?: No Did You Have Any Special Interests In School?: auto mechanics Did You Have Any Difficulty At [employer]?: Yes (slow at learning some subjects) Were Any Medications Ever Prescribed For These Difficulties?: No  Religion: Religion/Spirituality Are You A Religious Person?: Yes What is Your Religious Affiliation?: Baptist How Might This Affect Treatment?: No effect   Leisure/Recreation: Leisure / Recreation Leisure and [place]: used to like to work on cars, ride four wheelers,   Exercise/Diet: Exercise/Diet Do You Exercise?: No Have You Gained or Lost A Significant Amount of Weight in the Past Six Months?: Yes-Lost Number of Pounds Lost?: 20 Do You Follow a Special Diet?: No Do You Have Any Trouble Sleeping?: Yes Explanation of Sleeping Difficulties: difficulty staying asleep  CCA Part Two C  Alcohol/Drug Use: Alcohol / Drug Use History of alcohol /  drug use?: No history of alcohol / drug abuse  CCA Part Three  ASAM's:  Six Dimensions of Multidimensional Assessment N/A  Substance use  Disorder (SUD)  N/A    Social Function:  Social Functioning Social Maturity: Isolates Social Judgement: Normal  Stress:  Stress Stressors: Family conflict Coping Ability: Overwhelmed Patient Takes Medications The Way The Doctor Instructed?: Yes Priority Risk: Moderate Risk  Risk Assessment- Self-Harm Potential: Risk Assessment For Self-Harm Potential Thoughts of Self-Harm: No current thoughts  Risk Assessment -Dangerous to Others Potential: Risk Assessment For Dangerous to Others Potential Method: No Plan Notification Required: No need or identified person  DSM5 Diagnoses: Patient Active Problem List   Diagnosis Date Noted  . IBS (irritable bowel syndrome) 11/21/2014  . Ethmoid sinusitis 11/21/2014    Patient Centered Plan: Patient is on the following Treatment Plan(s):    Recommendations for Services/Supports/Treatments: Recommendations for Services/Supports/Treatments Recommendations For Services/Supports/Treatments: Individual Therapy the patient attends the assessment appointment today. Confidentiality and limits are discussed. The patient agrees to return for an appointment in 2 weeks for continuing assessment and treatment planning. He also agrees to call this practice, call 911, or have someone take him to the emergency room should symptoms worsen. Individual therapy is recommended 1 time every 1-2 weeks to improve coping skills and process feelings regarding current interpersonal issues.  Treatment Plan Summary:    Referrals to Alternative Service(s): Referred to Alternative Service(s):   Place:   Date:   Time:    Referred to Alternative Service(s):   Place:   Date:   Time:    Referred to Alternative Service(s):   Place:   Date:   Time:    Referred to Alternative Service(s):   Place:   Date:   Time:     BYNUM,PEGGY

## 2016-07-23 ENCOUNTER — Ambulatory Visit (HOSPITAL_COMMUNITY): Payer: Self-pay | Admitting: Psychiatry

## 2016-07-27 ENCOUNTER — Ambulatory Visit (HOSPITAL_COMMUNITY): Payer: Self-pay | Admitting: Psychiatry

## 2016-08-04 ENCOUNTER — Encounter (HOSPITAL_COMMUNITY): Payer: Self-pay | Admitting: Psychiatry

## 2016-08-04 ENCOUNTER — Ambulatory Visit (INDEPENDENT_AMBULATORY_CARE_PROVIDER_SITE_OTHER): Payer: Medicare Other | Admitting: Psychiatry

## 2016-08-04 DIAGNOSIS — F411 Generalized anxiety disorder: Secondary | ICD-10-CM

## 2016-08-04 NOTE — Progress Notes (Signed)
Patient:  Tyler Barton   DOB: 02-18-1971  MR Number: [id number]  Location: [facility]:  [address] [place],  [place], [address]  Start: Tuesday 08/04/2016 10:15 AM End: Tuesday 08/04/2016   Provider/Observer:     Maurice Small, MSW, LCSW   Chief Complaint:      Chief Complaint  Patient presents with  . Anxiety   Reason For Service:     Patient is a 46 year old male who presents with a long standing history of panic attacks and anxiety that began when he was 46 years old. Anxiety worsened in November 2017 after learning his wife was involved with another man. He says anxiety and panic attacks increased. He has been devastated by this. He says wife has discontinued seeing the person and wants to fix their marriage.   Interventions Strategy:  Supportive   Participation Level:   Active  Participation Quality:  Appropriate      Behavioral Observation:  Casual, Alert, and Depressed and Tearful.   Current Psychosocial Factors: Marital stress  Content of Session:   Establish rapport, used nondirective approach to allow patient to verbalize feelings and thoughts about marriage and interaction with wife, normalize feelings associated with loss issues related to marriage, assisted patient identify ways to improve self-nurture and care  Current Status:   Anxiety, excessive worry, crying spells, depressed moodpanic attacks, anxiety, poor concentration, memory difficulty, difficulty staying asleep, low energy, loss of interest, confusion,  decreased appetite   Suicidal/Homicidal:    No  Patient Progress:   Fair. Patient states feeling better and being less emotional since last session. He reports trying to reconcile with wife but still having trust issues as she does not seem as though she is genuine about reconciliation per his report. He reports not wanting to leave home and having constant worry as he fears she may become involved again with the man she was seeing. He expresses  hurt, disappointment, and anger.   Target Goals:   1.Establish rapport, 1. identify and verbalize feelings  Last Reviewed:     Goals Addressed Today:    1,2  Plan:   Return in two weeks  Impression/Diagnosis:   Patient presents with a long standing history of panic attacks and anxiety that began with when he was 46 years old. His ex brother-in-law with whom was very close died. 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 Symptoms have been managed with medication as prescribed by PCP but worsened in November 2017 after learning his wife was involved with another man. His current symptoms include anxiety, excessive worry, crying spells, depressed mood, panic attacks, anxiety, poor concentration, memory difficulty, difficulty staying asleep, low energy, loss of interest, confusion, and decreased appetite.    Diagnosis:  Axis I: Generalized anxiety disorder          Axis II: Deferred     Nasif Bos, LCSW 08/04/2016

## 2016-08-11 ENCOUNTER — Encounter: Payer: Self-pay | Admitting: Nurse Practitioner

## 2016-08-11 ENCOUNTER — Ambulatory Visit (INDEPENDENT_AMBULATORY_CARE_PROVIDER_SITE_OTHER): Payer: Medicare Other | Admitting: Nurse Practitioner

## 2016-08-11 VITALS — BP 127/78 | HR 83 | Temp 98.4°F | Ht 69.0 in | Wt 210.2 lb

## 2016-08-11 DIAGNOSIS — R300 Dysuria: Secondary | ICD-10-CM

## 2016-08-11 DIAGNOSIS — F419 Anxiety disorder, unspecified: Secondary | ICD-10-CM | POA: Diagnosis not present

## 2016-08-11 DIAGNOSIS — F410 Panic disorder [episodic paroxysmal anxiety] without agoraphobia: Secondary | ICD-10-CM | POA: Diagnosis not present

## 2016-08-11 DIAGNOSIS — Z202 Contact with and (suspected) exposure to infections with a predominantly sexual mode of transmission: Secondary | ICD-10-CM

## 2016-08-11 MED ORDER — DIAZEPAM 5 MG PO TABS: 5.0000 mg | ORAL_TABLET | Freq: Two times a day (BID) | ORAL | 2 refills | Status: DC | PRN

## 2016-08-11 NOTE — Progress Notes (Signed)
   Subjective:    Patient ID: Tyler Barton, male    DOB: 1971/02/14, 46 y.o.   MRN: [id number]  HPI Patient comes in today for follow up of anxiety- he was seen in November 2017 and was started on celexa. He went for psych intake on 07/08/16- said that his wife admitted to cheating on him and it really set off his anxiety- they decided to do out patient therapy. He went to see counselor on 08/04/16- in which I do not have access to notes. Speaking with patient he is committed to trying to work things out on his marriage . He has 2 more counseling sessions scheduled and he is planning on doing marriage counseling.    Review of Systems  Constitutional: Negative.   HENT: Negative.   Respiratory: Negative.   Cardiovascular: Negative.   Genitourinary: Negative.   Neurological: Negative.   Psychiatric/Behavioral: Negative.   All other systems reviewed and are negative.      Objective:   Physical Exam  Constitutional: He is oriented to person, place, and time. He appears well-developed and well-nourished. No distress.  Cardiovascular: Normal rate and regular rhythm.   Pulmonary/Chest: Effort normal and breath sounds normal.  Neurological: He is alert and oriented to person, place, and time.  Skin: Skin is warm.  Psychiatric: He has a normal mood and affect. His behavior is normal. Judgment and thought content normal.  Good eye contact  Answers all questions appropriately   BP 127/78   Pulse 83   Temp 98.4 F (36.9 C) (Oral)   Ht 5\' 9"  (1.753 m)   Wt 210 lb 3.2 oz (95.3 kg)   BMI 31.04 kg/m       Assessment & Plan:   1. Anxiety   2. Possible exposure to STD   3. Dysuria    Orders Placed This Encounter  Procedures  . GC/Chlamydia Probe Amp    Order Specific Question:   Source    Answer:   urine  . STD Screen (8)   Continue counseling Continue celexa Follow up in 3 months and prn  Mary-Margaret Hassell Done, FNP

## 2016-08-11 NOTE — Patient Instructions (Signed)
Stress and Stress Management Stress is a normal reaction to life events. It is what you feel when life demands more than you are used to or more than you can handle. Some stress can be useful. For example, the stress reaction can help you catch the last bus of the day, study for a test, or meet a deadline at work. But stress that occurs too often or for too long can cause problems. It can affect your emotional health and interfere with relationships and normal daily activities. Too much stress can weaken your immune system and increase your risk for physical illness. If you already have a medical problem, stress can make it worse. What are the causes? All sorts of life events may cause stress. An event that causes stress for one person may not be stressful for another person. Major life events commonly cause stress. These may be positive or negative. Examples include losing your job, moving into a new home, getting married, having a baby, or losing a loved one. Less obvious life events may also cause stress, especially if they occur day after day or in combination. Examples include working long hours, driving in traffic, caring for children, being in debt, or being in a difficult relationship. What are the signs or symptoms? Stress may cause emotional symptoms including, the following:  Anxiety. This is feeling worried, afraid, on edge, overwhelmed, or out of control.  Anger. This is feeling irritated or impatient.  Depression. This is feeling sad, down, helpless, or guilty.  Difficulty focusing, remembering, or making decisions. Stress may cause physical symptoms, including the following:  Aches and pains. These may affect your head, neck, back, stomach, or other areas of your body.  Tight muscles or clenched jaw.  Low energy or trouble sleeping. Stress may cause unhealthy behaviors, including the following:  Eating to feel better (overeating) or skipping meals.  Sleeping too little, too  much, or both.  Working too much or putting off tasks (procrastination).  Smoking, drinking alcohol, or using drugs to feel better. How is this diagnosed? Stress is diagnosed through an assessment by your health care provider. Your health care provider will ask questions about your symptoms and any stressful life events.Your health care provider will also ask about your medical history and may order blood tests or other tests. Certain medical conditions and medicine can cause physical symptoms similar to stress. Mental illness can cause emotional symptoms and unhealthy behaviors similar to stress. Your health care provider may refer you to a mental health professional for further evaluation. How is this treated? Stress management is the recommended treatment for stress.The goals of stress management are reducing stressful life events and coping with stress in healthy ways. Techniques for reducing stressful life events include the following:  Stress identification. Self-monitor for stress and identify what causes stress for you. These skills may help you to avoid some stressful events.  Time management. Set your priorities, keep a calendar of events, and learn to say "no." These tools can help you avoid making too many commitments. Techniques for coping with stress include the following:  Rethinking the problem. Try to think realistically about stressful events rather than ignoring them or overreacting. Try to find the positives in a stressful situation rather than focusing on the negatives.  Exercise. Physical exercise can release both physical and emotional tension. The key is to find a form of exercise you enjoy and do it regularly.  Relaxation techniques. These relax the body and mind. Examples include yoga,  meditation, tai chi, biofeedback, deep breathing, progressive muscle relaxation, listening to music, being out in nature, journaling, and other hobbies. Again, the key is to find one or  more that you enjoy and can do regularly.  Healthy lifestyle. Eat a balanced diet, get plenty of sleep, and do not smoke. Avoid using alcohol or drugs to relax.  Strong support network. Spend time with family, friends, or other people you enjoy being around.Express your feelings and talk things over with someone you trust. Counseling or talktherapy with a mental health professional may be helpful if you are having difficulty managing stress on your own. Medicine is typically not recommended for the treatment of stress.Talk to your health care provider if you think you need medicine for symptoms of stress. Follow these instructions at home:  Keep all follow-up visits as directed by your health care provider.  Take all medicines as directed by your health care provider. Contact a health care provider if:  Your symptoms get worse or you start having new symptoms.  You feel overwhelmed by your problems and can no longer manage them on your own. Get help right away if:  You feel like hurting yourself or someone else. This information is not intended to replace advice given to you by your health care provider. Make sure you discuss any questions you have with your health care provider. Document Released: 12/16/2000 Document Revised: 11/28/2015 Document Reviewed: 02/14/2013 Elsevier Interactive Patient Education  2017 [employer].

## 2016-08-11 NOTE — Addendum Note (Signed)
Addended by: Chevis Pretty on: 08/11/2016 03:18 PM   Modules accepted: Orders

## 2016-08-12 ENCOUNTER — Telehealth: Payer: Self-pay | Admitting: Nurse Practitioner

## 2016-08-12 LAB — STD SCREEN (8)
HEP C VIRUS AB: 0.1 {s_co_ratio} (ref 0.0–0.9)
HIV Screen 4th Generation wRfx: NONREACTIVE
HSV 1 GLYCOPROTEIN G AB, IGG: 44.8 {index} — AB (ref 0.00–0.90)
Hep A IgM: NEGATIVE
Hep B C IgM: NEGATIVE
Hepatitis B Surface Ag: NEGATIVE
RPR: NONREACTIVE

## 2016-08-12 NOTE — Telephone Encounter (Signed)
Patient aware of lab results.

## 2016-08-13 LAB — GC/CHLAMYDIA PROBE AMP
CHLAMYDIA, DNA PROBE: NEGATIVE
NEISSERIA GONORRHOEAE BY PCR: NEGATIVE

## 2016-08-21 ENCOUNTER — Encounter (HOSPITAL_COMMUNITY): Payer: Self-pay | Admitting: Psychiatry

## 2016-08-21 ENCOUNTER — Ambulatory Visit (INDEPENDENT_AMBULATORY_CARE_PROVIDER_SITE_OTHER): Payer: Medicare Other | Admitting: Psychiatry

## 2016-08-21 DIAGNOSIS — F411 Generalized anxiety disorder: Secondary | ICD-10-CM
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
## 2016-09-04 ENCOUNTER — Ambulatory Visit (INDEPENDENT_AMBULATORY_CARE_PROVIDER_SITE_OTHER): Payer: Medicare Other | Admitting: Psychiatry

## 2016-09-04 ENCOUNTER — Encounter (HOSPITAL_COMMUNITY): Payer: Self-pay | Admitting: Psychiatry

## 2016-09-04 DIAGNOSIS — F411 Generalized anxiety disorder: Secondary | ICD-10-CM
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
## 2016-09-18 ENCOUNTER — Ambulatory Visit (HOSPITAL_COMMUNITY): Payer: Self-pay | Admitting: Psychiatry

## 2016-09-22 ENCOUNTER — Encounter: Payer: Self-pay | Admitting: Family Medicine

## 2016-09-22 ENCOUNTER — Ambulatory Visit (INDEPENDENT_AMBULATORY_CARE_PROVIDER_SITE_OTHER): Payer: Medicare Other | Admitting: Family Medicine

## 2016-09-22 VITALS — BP 129/86 | HR 66 | Temp 97.0°F | Ht 69.0 in | Wt 208.4 lb

## 2016-09-22 DIAGNOSIS — J0101 Acute recurrent maxillary sinusitis: Secondary | ICD-10-CM | POA: Diagnosis not present

## 2016-09-22 MED ORDER — AMOXICILLIN-POT CLAVULANATE 875-125 MG PO TABS: 1.0000 | ORAL_TABLET | Freq: Two times a day (BID) | ORAL | 0 refills | Status: DC
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
## 2016-09-22 NOTE — Patient Instructions (Signed)
Great to see you!  I am treating you for a sinus infection, this may help your dizziness. You may also have true BPPV ( vertigo). See the handout and the youtube video ( search for "epley maneuver" by [facility] ) .    Sinusitis, Adult Sinusitis is soreness and inflammation of your sinuses. Sinuses are hollow spaces in the bones around your face. They are located:  Around your eyes.  In the middle of your forehead.  Behind your nose.  In your cheekbones. Your sinuses and nasal passages are lined with a stringy fluid (mucus). Mucus normally drains out of your sinuses. When your nasal tissues get inflamed or swollen, the mucus can get trapped or blocked so air cannot flow through your sinuses. This lets bacteria, viruses, and funguses grow, and that leads to infection. Follow these instructions at home: Medicines   Take, use, or apply over-the-counter and prescription medicines only as told by your doctor. These may include nasal sprays.  If you were prescribed an antibiotic medicine, take it as told by your doctor. Do not stop taking the antibiotic even if you start to feel better. Hydrate and Humidify   Drink enough water to keep your pee (urine) clear or pale yellow.  Use a cool mist humidifier to keep the humidity level in your home above 50%.  Breathe in steam for 10-15 minutes, 3-4 times a day or as told by your doctor. You can do this in the bathroom while a hot shower is running.  Try not to spend time in cool or dry air. Rest   Rest as much as possible.  Sleep with your head raised (elevated).  Make sure to get enough sleep each night. General instructions   Put a warm, moist washcloth on your face 3-4 times a day or as told by your doctor. This will help with discomfort.  Wash your hands often with soap and water. If there is no soap and water, use hand sanitizer.  Do not smoke. Avoid being around people who are smoking (secondhand smoke).  Keep all follow-up  visits as told by your doctor. This is important. Contact a doctor if:  You have a fever.  Your symptoms get worse.  Your symptoms do not get better within 10 days. Get help right away if:  You have a very bad headache.  You cannot stop throwing up (vomiting).  You have pain or swelling around your face or eyes.  You have trouble seeing.  You feel confused.  Your neck is stiff.  You have trouble breathing. This information is not intended to replace advice given to you by your health care provider. Make sure you discuss any questions you have with your health care provider. Document Released: 12/09/2007 Document Revised: 02/16/2016 Document Reviewed: 04/17/2015 Elsevier Interactive Patient Education  2017 [employer].

## 2016-10-02 ENCOUNTER — Ambulatory Visit (HOSPITAL_COMMUNITY): Payer: Self-pay | Admitting: Psychiatry

## 2016-10-19 ENCOUNTER — Telehealth (HOSPITAL_COMMUNITY): Payer: Self-pay | Admitting: *Deleted

## 2016-10-19 NOTE — Telephone Encounter (Signed)
patient said he is doing well, and don't need an appointment at this time.   Cancelled both appointments, and will call us back if needed.

## 2016-10-23 ENCOUNTER — Ambulatory Visit (HOSPITAL_COMMUNITY): Payer: Self-pay | Admitting: Psychiatry

## 2016-10-30 ENCOUNTER — Ambulatory Visit (HOSPITAL_COMMUNITY): Payer: Self-pay | Admitting: Psychiatry

## 2016-11-09 ENCOUNTER — Ambulatory Visit: Payer: Medicare Other | Admitting: Nurse Practitioner

## 2016-11-20 ENCOUNTER — Telehealth: Payer: Self-pay | Admitting: Nurse Practitioner

## 2016-11-20 ENCOUNTER — Other Ambulatory Visit: Payer: Self-pay

## 2016-11-20 MED ORDER — FLUTICASONE PROPIONATE 50 MCG/ACT NA SUSP: NASAL | 2 refills | Status: DC

## 2016-11-20 NOTE — Telephone Encounter (Signed)
done

## 2016-11-20 NOTE — Telephone Encounter (Signed)
What is the name of the medication? flonase  Have you contacted your pharmacy to request a refill? yes  Which pharmacy would you like this sent to? [facility] in [place]   Patient notified that their request is being sent to the clinical staff for review and that they should receive a call once it is complete. If they do not receive a call within 24 hours they can check with their pharmacy or our office.

## 2016-11-20 NOTE — Telephone Encounter (Signed)
What is the name of the medication? diazepam  Have you contacted your pharmacy to request a refill? no  Which pharmacy would you like this sent to? [facility] written [facility]   Patient notified that their request is being sent to the clinical staff for review and that they should receive a call once it is complete. If they do not receive a call within 24 hours they can check with their pharmacy or our office.

## 2016-11-23 ENCOUNTER — Other Ambulatory Visit: Payer: Self-pay | Admitting: Nurse Practitioner

## 2016-11-23 ENCOUNTER — Other Ambulatory Visit: Payer: Self-pay

## 2016-11-23 DIAGNOSIS — F3342 Major depressive disorder, recurrent, in full remission: Secondary | ICD-10-CM

## 2016-11-23 DIAGNOSIS — F410 Panic disorder [episodic paroxysmal anxiety] without agoraphobia: Secondary | ICD-10-CM

## 2016-11-23 MED ORDER — DIAZEPAM 5 MG PO TABS: 5.0000 mg | ORAL_TABLET | Freq: Two times a day (BID) | ORAL | 1 refills | Status: DC | PRN

## 2016-11-23 MED ORDER — CITALOPRAM HYDROBROMIDE 40 MG PO TABS: 40.0000 mg | ORAL_TABLET | Freq: Every day | ORAL | 2 refills | Status: DC

## 2016-11-23 NOTE — Telephone Encounter (Signed)
Rx up front. Ready for pick up

## 2016-11-23 NOTE — Telephone Encounter (Signed)
Please call in diazepam with 0 refills

## 2017-02-04 ENCOUNTER — Telehealth: Payer: Self-pay | Admitting: Nurse Practitioner

## 2017-02-04 DIAGNOSIS — F410 Panic disorder [episodic paroxysmal anxiety] without agoraphobia: Secondary | ICD-10-CM

## 2017-02-04 DIAGNOSIS — F3342 Major depressive disorder, recurrent, in full remission: Secondary | ICD-10-CM

## 2017-02-04 MED ORDER — DIAZEPAM 5 MG PO TABS: 5.0000 mg | ORAL_TABLET | Freq: Two times a day (BID) | ORAL | 0 refills | Status: DC | PRN

## 2017-02-04 MED ORDER — CITALOPRAM HYDROBROMIDE 40 MG PO TABS: 40.0000 mg | ORAL_TABLET | Freq: Every day | ORAL | 0 refills | Status: DC

## 2017-02-04 NOTE — Telephone Encounter (Signed)
Patient notified rx up front and ready for pick up 

## 2017-02-04 NOTE — Telephone Encounter (Signed)
RX ready for pick. PT advise pt that he needs to make schedule appt with MMM every 6 months. We will not be able to do this again,  So he needs to plan accordingly.

## 2017-02-04 NOTE — Telephone Encounter (Signed)
Patient of MMM. Due for follow up and out of meds. I couldn't get him scheduled until Tuesday. Refilled celexa. Needs refill of valium. Please advise and route to [facility]. MMM normally prints rx and has him pick up

## 2017-02-12 ENCOUNTER — Ambulatory Visit (INDEPENDENT_AMBULATORY_CARE_PROVIDER_SITE_OTHER): Payer: Medicare Other | Admitting: Nurse Practitioner

## 2017-02-12 ENCOUNTER — Encounter: Payer: Self-pay | Admitting: Nurse Practitioner

## 2017-02-12 VITALS — BP 113/77 | HR 72 | Temp 97.5°F | Ht 69.0 in | Wt 202.0 lb

## 2017-02-12 DIAGNOSIS — F410 Panic disorder [episodic paroxysmal anxiety] without agoraphobia: Secondary | ICD-10-CM

## 2017-02-12 DIAGNOSIS — J0100 Acute maxillary sinusitis, unspecified: Secondary | ICD-10-CM | POA: Diagnosis not present

## 2017-02-12 DIAGNOSIS — K582 Mixed irritable bowel syndrome: Secondary | ICD-10-CM

## 2017-02-12 DIAGNOSIS — F3342 Major depressive disorder, recurrent, in full remission: Secondary | ICD-10-CM

## 2017-02-12 DIAGNOSIS — F324 Major depressive disorder, single episode, in partial remission: Secondary | ICD-10-CM | POA: Insufficient documentation

## 2017-02-12 MED ORDER — LORATADINE 10 MG PO TABS: 10.0000 mg | ORAL_TABLET | Freq: Every day | ORAL | 5 refills | Status: DC

## 2017-02-12 MED ORDER — FLUTICASONE PROPIONATE 50 MCG/ACT NA SUSP: NASAL | 5 refills | Status: DC

## 2017-02-12 MED ORDER — DIAZEPAM 5 MG PO TABS: 5.0000 mg | ORAL_TABLET | Freq: Two times a day (BID) | ORAL | 3 refills | Status: DC | PRN

## 2017-02-12 MED ORDER — AMOXICILLIN-POT CLAVULANATE 875-125 MG PO TABS: 1.0000 | ORAL_TABLET | Freq: Two times a day (BID) | ORAL | 0 refills | Status: DC

## 2017-02-12 MED ORDER — CITALOPRAM HYDROBROMIDE 40 MG PO TABS: 40.0000 mg | ORAL_TABLET | Freq: Every day | ORAL | 5 refills | Status: DC

## 2017-02-12 NOTE — Patient Instructions (Signed)

## 2017-02-12 NOTE — Progress Notes (Signed)
Subjective:    Patient ID: Tyler STRAUGHTER, male    DOB: 01-11-1971, 46 y.o.   MRN: [id number]  HPI  Tyler Barton is here today for follow up of chronic medical problem.  Outpatient Encounter Prescriptions as of 02/12/2017  Medication Sig  . citalopram (CELEXA) 40 MG tablet Take 1 tablet (40 mg total) by mouth daily.  . diazepam (VALIUM) 5 MG tablet Take 1 tablet (5 mg total) by mouth every 12 (twelve) hours as needed for anxiety.  . fluticasone (FLONASE) 50 MCG/ACT nasal spray SPRAY 2 SPRAYS IN EACH NOSTRIL ONCE DAILY.  Marland Kitchen loratadine (CLARITIN) 10 MG tablet Take 10 mg by mouth daily. Reported on 07/10/2015     1. Depression, major, single episode, in partial remission [facility])  Patient still taking celexa- seems to be working pretty good. No side effects. Marital situation is improving. Takes valium dialy  2. Irritable bowel syndrome with both constipation and diarrhea  Takes stool softner daily and that seems to keep him pretty well under control. DOes have hemorrhoids.    New complaints: Having sinus problems- he is on claritin but has started having facila pressure and headache for 3 days.   Social history: Marital problems have improved. Step son has social disorder and is not able to work and stays home on video games all the time.    Review of Systems  Constitutional: Negative for activity change and appetite change.  HENT: Negative.   Eyes: Negative for pain.  Respiratory: Negative for shortness of breath.   Cardiovascular: Negative for chest pain, palpitations and leg swelling.  Gastrointestinal: Negative for abdominal pain.  Endocrine: Negative for polydipsia.  Genitourinary: Negative.   Skin: Negative for rash.  Neurological: Negative for dizziness, weakness and headaches.  Hematological: Does not bruise/bleed easily.  Psychiatric/Behavioral: Negative.   All other systems reviewed and are negative.      Objective:   Physical Exam  Constitutional: He is oriented  to person, place, and time. He appears well-developed and well-nourished. No distress.  HENT:  Right Ear: External ear normal.  Left Ear: External ear normal.  Nose: Mucosal edema and rhinorrhea present. Right sinus exhibits maxillary sinus tenderness. Right sinus exhibits no frontal sinus tenderness. Left sinus exhibits maxillary sinus tenderness. Left sinus exhibits no frontal sinus tenderness.  Mouth/Throat: Uvula is midline and oropharynx is clear and moist.  Eyes: Pupils are equal, round, and reactive to light.  Neck: Normal range of motion. Neck supple.  Cardiovascular: Normal rate and regular rhythm.   Pulmonary/Chest: Effort normal and breath sounds normal.  Abdominal: Soft. Bowel sounds are normal.  Neurological: He is alert and oriented to person, place, and time. He has normal reflexes.  Skin: Skin is warm.  Psychiatric: He has a normal mood and affect. His behavior is normal. Judgment and thought content normal.   BP 113/77   Pulse 72   Temp (!) 97.5 F (36.4 C) (Oral)   Ht 5\' 9"  (1.753 m)   Wt 202 lb (91.6 kg)   BMI 29.83 kg/m        Assessment & Plan:  1. Irritable bowel syndrome with both constipation and diarrhea Continue stool softners will do referral to specialist when patient is ready  2. Recurrent major depressive disorder, in full remission ([place]) - citalopram (CELEXA) 40 MG tablet; Take 1 tablet (40 mg total) by mouth daily.  Dispense: 30 tablet; Refill: 5  3. Panic attacks Stress management - diazepam (VALIUM) 5 MG tablet; Take 1 tablet (  5 mg total) by mouth every 12 (twelve) hours as needed for anxiety.  Dispense: 60 tablet; Refill: 3  4. Acute non-recurrent maxillary sinusitis 1. Take meds as prescribed 2. Use a cool mist humidifier especially during the winter months and when heat has been humid. 3. Use saline nose sprays frequently 4. Saline irrigations of the nose can be very helpful if done frequently.  * 4X daily for 1 week*  * Use of a  nettie pot can be helpful with this. Follow directions with this* 5. Drink plenty of fluids 6. Keep thermostat turn down low 7.For any cough or congestion  Use plain Mucinex- regular strength or max strength is fine   * Children- consult with Pharmacist for dosing 8. For fever or aces or pains- take tylenol or ibuprofen appropriate for age and weight.  * for fevers greater than 101 orally you may alternate ibuprofen and tylenol every  3 hours.    - loratadine (CLARITIN) 10 MG tablet; Take 1 tablet (10 mg total) by mouth daily. Reported on 07/10/2015  Dispense: 30 tablet; Refill: 5 - fluticasone (FLONASE) 50 MCG/ACT nasal spray; SPRAY 2 SPRAYS IN EACH NOSTRIL ONCE DAILY.  Dispense: 16 g; Refill: 5 - amoxicillin-clavulanate (AUGMENTIN) 875-125 MG tablet; Take 1 tablet by mouth 2 (two) times daily.  Dispense: 20 tablet; Refill: 0  Mary-Margaret Hassell Done, FNP

## 2017-03-26 ENCOUNTER — Other Ambulatory Visit: Payer: Self-pay | Admitting: Nurse Practitioner

## 2017-03-26 DIAGNOSIS — F410 Panic disorder [episodic paroxysmal anxiety] without agoraphobia: Secondary | ICD-10-CM

## 2017-03-26 NOTE — Telephone Encounter (Signed)
Last seen 8/0/18  MMM   If approved route to nurse to call into [facility]

## 2017-03-26 NOTE — Telephone Encounter (Signed)
Please call in diazepam with 1 refills

## 2017-04-24 ENCOUNTER — Ambulatory Visit (INDEPENDENT_AMBULATORY_CARE_PROVIDER_SITE_OTHER): Payer: Medicare Other | Admitting: Family Medicine

## 2017-04-24 VITALS — BP 124/73 | HR 65 | Temp 97.1°F | Ht 69.0 in | Wt 208.0 lb

## 2017-04-24 DIAGNOSIS — M545 Low back pain: Secondary | ICD-10-CM | POA: Diagnosis not present

## 2017-04-24 MED ORDER — METHYLPREDNISOLONE ACETATE 80 MG/ML IJ SUSP
80.0000 mg | Freq: Once | INTRAMUSCULAR | Status: AC
  Administered 2017-04-24: 80 mg via INTRAMUSCULAR

## 2017-04-24 MED ORDER — CYCLOBENZAPRINE HCL 10 MG PO TABS: 10.0000 mg | ORAL_TABLET | Freq: Three times a day (TID) | ORAL | 0 refills | Status: DC | PRN

## 2017-04-24 MED ORDER — DICLOFENAC SODIUM 75 MG PO TBEC: 75.0000 mg | DELAYED_RELEASE_TABLET | Freq: Two times a day (BID) | ORAL | 0 refills | Status: DC

## 2017-04-24 NOTE — Progress Notes (Signed)
   HPI  Patient presents today with back pain.  Patient states that he is been hurting for about 3-1/2 weeks.  This started after he was moving some small pieces of concrete with a family member.  He states that he has a history of degenerative disc disease and is very cautious with what he picks up.  He did not feel it was too heavy. However he has had pain for about 3 weeks now.  He describes it as dull persistent pain.  It is worse in the morning and better as he begins to move throughout the day.  It so severe in the morning that he is able to bend forward to tie his shoes.  He has been using Goody powders with some improvement.  He is also using ibuprofen with mild improvement.  He denies any sciatica, however he does typically develop sciatica with back pain.  No leg weakness, numbness, saddle anesthesia, or difficulty walking  PMH: Smoking status noted ROS: Per HPI  Objective: BP 124/73 (BP Location: Right Arm)   Pulse 65   Temp (!) 97.1 F (36.2 C) (Oral)   Ht 5\' 9"  (1.753 m)   Wt 208 lb (94.3 kg)   BMI 30.72 kg/m  Gen: NAD, alert, cooperative with exam HEENT: NCAT CV: RRR, good S1/S2, no murmur Resp: CTABL, no wheezes, non-labored Ext: No edema, warm Neuro: Alert and oriented, No gross deficits MSK Mild TTP of the paraspinal muscles bilaterally in the lumbar area, no tenderness to palpation of the midline spine  Assessment and plan:  #Low back pain Likely lumbar strain Given IM Depo-Medrol, Flexeril. Caution with NSAIDs, reviewed appropriate dosing-avoid Goody powders while using Voltaren, may discontinue Voltaren with controlled use of Goody powders If not improving within 1 week please return for x-ray. She was concerned about a kidney infection-his urinalysis is reassuring with no signs of UTI or blood     Orders Placed This Encounter  Procedures  . Urine Culture  . Urinalysis, Complete    Meds ordered this encounter  Medications  .  methylPREDNISolone acetate (DEPO-MEDROL) injection 80 mg  . cyclobenzaprine (FLEXERIL) 10 MG tablet    Sig: Take 1 tablet (10 mg total) by mouth 3 (three) times daily as needed for muscle spasms.    Dispense:  30 tablet    Refill:  0  . diclofenac (VOLTAREN) 75 MG EC tablet    Sig: Take 1 tablet (75 mg total) by mouth 2 (two) times daily.    Dispense:  20 tablet    Refill:  0    Laroy Apple, MD [facility] 04/24/2017, 11:34 AM

## 2017-04-24 NOTE — Patient Instructions (Signed)
Great to meet you! 

## 2017-04-25 LAB — URINE CULTURE: Organism ID, Bacteria: NO GROWTH

## 2017-04-26 ENCOUNTER — Encounter: Payer: Self-pay | Admitting: Family Medicine

## 2017-05-07 ENCOUNTER — Encounter: Payer: Self-pay | Admitting: Nurse Practitioner

## 2017-05-07 ENCOUNTER — Ambulatory Visit (INDEPENDENT_AMBULATORY_CARE_PROVIDER_SITE_OTHER): Payer: Medicare Other | Admitting: Nurse Practitioner

## 2017-05-07 VITALS — BP 116/78 | HR 79 | Temp 97.7°F | Ht 69.0 in | Wt 208.0 lb

## 2017-05-07 DIAGNOSIS — F410 Panic disorder [episodic paroxysmal anxiety] without agoraphobia: Secondary | ICD-10-CM | POA: Diagnosis not present

## 2017-05-07 DIAGNOSIS — M7711 Lateral epicondylitis, right elbow: Secondary | ICD-10-CM

## 2017-05-07 DIAGNOSIS — F3342 Major depressive disorder, recurrent, in full remission: Secondary | ICD-10-CM | POA: Diagnosis not present

## 2017-05-07 DIAGNOSIS — Z125 Encounter for screening for malignant neoplasm of prostate: Secondary | ICD-10-CM

## 2017-05-07 DIAGNOSIS — M5441 Lumbago with sciatica, right side: Secondary | ICD-10-CM | POA: Diagnosis not present

## 2017-05-07 DIAGNOSIS — K582 Mixed irritable bowel syndrome: Secondary | ICD-10-CM

## 2017-05-07 LAB — URINALYSIS, COMPLETE
BILIRUBIN UA: NEGATIVE
GLUCOSE, UA: NEGATIVE
Ketones, UA: NEGATIVE
Leukocytes, UA: NEGATIVE
Nitrite, UA: NEGATIVE
PROTEIN UA: NEGATIVE
RBC, UA: NEGATIVE
Specific Gravity, UA: 1.015 (ref 1.005–1.030)
UUROB: 0.2 mg/dL (ref 0.2–1.0)
pH, UA: 5.5 (ref 5.0–7.5)

## 2017-05-07 LAB — MICROSCOPIC EXAMINATION
BACTERIA UA: NONE SEEN
Epithelial Cells (non renal): NONE SEEN /hpf (ref 0–10)
RBC, UA: NONE SEEN /hpf (ref 0–?)
Renal Epithel, UA: NONE SEEN /hpf
WBC UA: NONE SEEN /HPF (ref 0–?)

## 2017-05-07 MED ORDER — DIAZEPAM 5 MG PO TABS: 5.0000 mg | ORAL_TABLET | Freq: Two times a day (BID) | ORAL | 1 refills | Status: DC | PRN

## 2017-05-07 MED ORDER — CITALOPRAM HYDROBROMIDE 40 MG PO TABS: 40.0000 mg | ORAL_TABLET | Freq: Every day | ORAL | 5 refills | Status: DC

## 2017-05-07 MED ORDER — PREDNISONE 20 MG PO TABS: ORAL_TABLET | ORAL | 0 refills | Status: DC

## 2017-05-07 NOTE — Addendum Note (Signed)
Addended by: Chevis Pretty on: 05/07/2017 02:56 PM   Modules accepted: Orders

## 2017-05-07 NOTE — Progress Notes (Addendum)
Subjective:    Patient ID: Tyler Barton, male    DOB: 1971-02-12, 46 y.o.   MRN: [id number]  HPI  Tyler Barton is here today for follow up of chronic medical problem.  Outpatient Encounter Prescriptions as of 05/07/2017  Medication Sig  . citalopram (CELEXA) 40 MG tablet Take 1 tablet (40 mg total) by mouth daily.  . diazepam (VALIUM) 5 MG tablet TAKE 1 TABLET BY MOUTH EVERY 12 HOURS AS NEEDED FOR ANXIETY  . fluticasone (FLONASE) 50 MCG/ACT nasal spray SPRAY 2 SPRAYS IN EACH NOSTRIL ONCE DAILY.  Marland Kitchen loratadine (CLARITIN) 10 MG tablet Take 1 tablet (10 mg total) by mouth daily. Reported on 07/10/2015    1. Irritable bowel syndrome with both constipation and diarrhea  Doing well no recent flare ups  2. Panic attacks  Doing well on current medication  3. Depression, major, single episode, in partial remission ([place])  Is on celexa and valium- if he does not take valium then he cannot function. Depression screen [facility] 2/9 05/07/2017 04/24/2017 02/12/2017  Decreased Interest 0 0 0  Down, Depressed, Hopeless 0 0 0  PHQ - 2 Score 0 0 0  Altered sleeping - - -  Tired, decreased energy - - -  Change in appetite - - -  Feeling bad or failure about yourself  - - -  Trouble concentrating - - -  Moving slowly or fidgety/restless - - -  Suicidal thoughts - - -  PHQ-9 Score - - -       New complaints: -patient hurt his back several weeks ago. Saw dr. Wendi Snipes. Was given flexeril and voltaren. No better at all. Pain is intermittent. Moving a lot and walking increases pain. He has had back pain off and on for many years. He says in the mornings he is very stiff and can hardly get out of bed.  - right elbow pain- started about 2 months ago- very tender to touch and if he hits it on something he will bring tears to eyes  Social history: Is on disability    Review of Systems  Constitutional: Negative for activity change and appetite change.  HENT: Negative.   Eyes: Negative for pain.    Respiratory: Negative for shortness of breath.   Cardiovascular: Negative for chest pain, palpitations and leg swelling.  Gastrointestinal: Negative for abdominal pain.  Endocrine: Negative for polydipsia.  Genitourinary: Negative.   Skin: Negative for rash.  Neurological: Negative for dizziness, weakness and headaches.  Hematological: Does not bruise/bleed easily.  Psychiatric/Behavioral: Negative.   All other systems reviewed and are negative.      Objective:   Physical Exam  Constitutional: He is oriented to person, place, and time. He appears well-developed and well-nourished.  HENT:  Head: Normocephalic.  Right Ear: External ear normal.  Left Ear: External ear normal.  Nose: Nose normal.  Mouth/Throat: Oropharynx is clear and moist.  Eyes: Pupils are equal, round, and reactive to light. EOM are normal.  Neck: Normal range of motion. Neck supple. No JVD present. No thyromegaly present.  Cardiovascular: Normal rate, regular rhythm, normal heart sounds and intact distal pulses.  Exam reveals no gallop and no friction rub.   No murmur heard. Pulmonary/Chest: Effort normal and breath sounds normal. No respiratory distress. He has no wheezes. He has no rales. He exhibits no tenderness.  Abdominal: Soft. Bowel sounds are normal. He exhibits no mass. There is no tenderness.  Genitourinary: Prostate normal and penis normal.  Musculoskeletal: Normal range  of motion. He exhibits no edema.  FROM of lumbar spine with pain on flexion and extension Motor strength and sensation distally intact  Lymphadenopathy:    He has no cervical adenopathy.  Neurological: He is alert and oriented to person, place, and time. He has normal reflexes. No cranial nerve deficit.  Skin: Skin is warm and dry.  Psychiatric: He has a normal mood and affect. His behavior is normal. Judgment and thought content normal.   BP 116/78   Pulse 79   Temp 97.7 F (36.5 C) (Oral)   Ht 5\' 9"  (1.753 m)   Wt 208 lb  (94.3 kg)   BMI 30.72 kg/m       Assessment & Plan:  1. Irritable bowel syndrome with both constipation and diarrhea Watch diet  2. Panic attacks Stress management - diazepam (VALIUM) 5 MG tablet; Take 1 tablet (5 mg total) by mouth every 12 (twelve) hours as needed. for anxiety  Dispense: 60 tablet; Refill: 1  3. Recurrent major depressive disorder, in full remission ([place]) - citalopram (CELEXA) 40 MG tablet; Take 1 tablet (40 mg total) by mouth daily.  Dispense: 30 tablet; Refill: 5  4. Acute right-sided low back pain with right-sided sciatica Moist heat  Rest No heavy lifting Continue flexeril as ordered - predniSONE (DELTASONE) 20 MG tablet; 2 po at sametime daily for 5 days  Dispense: 10 tablet; Refill: 0  5. Prostate cancer screening  6. Right lateral epicondylitis TENNIS ELBOW STRAP DAILY FOR 4 WEEKS  Needs to schedule complete physical Labs pending Health maintenance reviewed Diet and exercise encouraged Continue all meds Follow up  In 6 months   [place], FNP

## 2017-05-07 NOTE — Patient Instructions (Signed)
Stress and Stress Management Stress is a normal reaction to life events. It is what you feel when life demands more than you are used to or more than you can handle. Some stress can be useful. For example, the stress reaction can help you catch the last bus of the day, study for a test, or meet a deadline at work. But stress that occurs too often or for too long can cause problems. It can affect your emotional health and interfere with relationships and normal daily activities. Too much stress can weaken your immune system and increase your risk for physical illness. If you already have a medical problem, stress can make it worse. What are the causes? All sorts of life events may cause stress. An event that causes stress for one person may not be stressful for another person. Major life events commonly cause stress. These may be positive or negative. Examples include losing your job, moving into a new home, getting married, having a baby, or losing a loved one. Less obvious life events may also cause stress, especially if they occur day after day or in combination. Examples include working long hours, driving in traffic, caring for children, being in debt, or being in a difficult relationship. What are the signs or symptoms? Stress may cause emotional symptoms including, the following:  Anxiety. This is feeling worried, afraid, on edge, overwhelmed, or out of control.  Anger. This is feeling irritated or impatient.  Depression. This is feeling sad, down, helpless, or guilty.  Difficulty focusing, remembering, or making decisions.  Stress may cause physical symptoms, including the following:  Aches and pains. These may affect your head, neck, back, stomach, or other areas of your body.  Tight muscles or clenched jaw.  Low energy or trouble sleeping.  Stress may cause unhealthy behaviors, including the following:  Eating to feel better (overeating) or skipping meals.  Sleeping too little,  too much, or both.  Working too much or putting off tasks (procrastination).  Smoking, drinking alcohol, or using drugs to feel better.  How is this diagnosed? Stress is diagnosed through an assessment by your health care provider. Your health care provider will ask questions about your symptoms and any stressful life events.Your health care provider will also ask about your medical history and may order blood tests or other tests. Certain medical conditions and medicine can cause physical symptoms similar to stress. Mental illness can cause emotional symptoms and unhealthy behaviors similar to stress. Your health care provider may refer you to a mental health professional for further evaluation. How is this treated? Stress management is the recommended treatment for stress.The goals of stress management are reducing stressful life events and coping with stress in healthy ways. Techniques for reducing stressful life events include the following:  Stress identification. Self-monitor for stress and identify what causes stress for you. These skills may help you to avoid some stressful events.  Time management. Set your priorities, keep a calendar of events, and learn to say "no." These tools can help you avoid making too many commitments.  Techniques for coping with stress include the following:  Rethinking the problem. Try to think realistically about stressful events rather than ignoring them or overreacting. Try to find the positives in a stressful situation rather than focusing on the negatives.  Exercise. Physical exercise can release both physical and emotional tension. The key is to find a form of exercise you enjoy and do it regularly.  Relaxation techniques. These relax the body and  mind. Examples include yoga, meditation, tai chi, biofeedback, deep breathing, progressive muscle relaxation, listening to music, being out in nature, journaling, and other hobbies. Again, the key is to find  one or more that you enjoy and can do regularly.  Healthy lifestyle. Eat a balanced diet, get plenty of sleep, and do not smoke. Avoid using alcohol or drugs to relax.  Strong support network. Spend time with family, friends, or other people you enjoy being around.Express your feelings and talk things over with someone you trust.  Counseling or talktherapy with a mental health professional may be helpful if you are having difficulty managing stress on your own. Medicine is typically not recommended for the treatment of stress.Talk to your health care provider if you think you need medicine for symptoms of stress. Follow these instructions at home:  Keep all follow-up visits as directed by your health care provider.  Take all medicines as directed by your health care provider. Contact a health care provider if:  Your symptoms get worse or you start having new symptoms.  You feel overwhelmed by your problems and can no longer manage them on your own. Get help right away if:  You feel like hurting yourself or someone else. This information is not intended to replace advice given to you by your health care provider. Make sure you discuss any questions you have with your health care provider. Document Released: 12/16/2000 Document Revised: 11/28/2015 Document Reviewed: 02/14/2013 Elsevier Interactive Patient Education  2017 [employer]vier Inc[employer]  

## 2017-05-13 ENCOUNTER — Telehealth: Payer: Self-pay | Admitting: Nurse Practitioner

## 2017-05-13 DIAGNOSIS — G8929 Other chronic pain: Secondary | ICD-10-CM

## 2017-05-13 DIAGNOSIS — M545 Low back pain: Principal | ICD-10-CM

## 2017-05-13 NOTE — Telephone Encounter (Signed)
Spoke to pt and he says his back is getting worse to the point that he can barely walk. He wanted to know if there was anything else you can do. Please advise.

## 2017-05-13 NOTE — Telephone Encounter (Signed)
Will do referral to dr. Nelva Bush at [facility] orthopedics

## 2017-05-13 NOTE — Telephone Encounter (Signed)
Pt aware.

## 2017-05-13 NOTE — Telephone Encounter (Signed)
Pt has been seen multiple times for his back and he has taken all med rx. Its no better pain radiates down right leg been going on x 1 mth and he would like to speak with a nurse. Please advise.

## 2017-05-29 ENCOUNTER — Ambulatory Visit (INDEPENDENT_AMBULATORY_CARE_PROVIDER_SITE_OTHER): Payer: Medicare Other | Admitting: Pediatrics

## 2017-05-29 ENCOUNTER — Encounter: Payer: Self-pay | Admitting: Pediatrics

## 2017-05-29 VITALS — BP 112/78 | HR 72 | Temp 98.2°F | Ht 69.0 in | Wt 209.6 lb

## 2017-05-29 DIAGNOSIS — J069 Acute upper respiratory infection, unspecified: Secondary | ICD-10-CM | POA: Diagnosis not present

## 2017-05-29 NOTE — Progress Notes (Signed)
  Subjective:   Patient ID: Tyler Barton, male    DOB: September 29, 1970, 46 y.o.   MRN: [id number] CC: Nasal Congestion   HPI: Tyler Barton is a 46 y.o. male presenting for Nasal Congestion   Started getting sick about two days ago Has a runny nose Cant sleep because of sinus drainage, was up coughing last night Taking [employer] brand sinus pill with Sudafed Taking claritin and nasal spray daily As Neti pot at home has not been using it Not taking any Tylenol or ibuprofen No ear pain but they have been popping more than usual  Appetite down yesterday and today No fevers Given his normal self other than the sinus drainage   Relevant past medical, surgical, family and social history reviewed. Allergies and medications reviewed and updated. Social History   Tobacco Use  Smoking Status Never Smoker  Smokeless Tobacco Never Used   ROS: Per HPI   Objective:    BP 112/78   Pulse 72   Temp 98.2 F (36.8 C) (Oral)   Ht 5\' 9"  (1.753 m)   Wt 209 lb 9.6 oz (95.1 kg)   BMI 30.95 kg/m   Wt Readings from Last 3 Encounters:  05/29/17 209 lb 9.6 oz (95.1 kg)  05/07/17 208 lb (94.3 kg)  04/24/17 208 lb (94.3 kg)    Gen: NAD, alert, cooperative with exam, NCAT EYES: EOMI, no conjunctival injection, or no icterus ENT:  TMs dull gray b/l, OP with mild erythema LYMPH: no cervical LAD CV: NRRR, normal S1/S2, no murmur, distal pulses 2+ b/l Resp: CTABL, no wheezes, normal WOB Abd: +BS, soft, NTND.  Ext: No edema, warm Neuro: Alert and oriented, strength equal b/l UE and LE, coordination grossly normal MSK: normal muscle bulk  Assessment & Plan:  Gabriella was seen today for nasal congestion  Diagnoses and all orders for this visit:  Acute URI Discussed symptom care and return precautions  Follow up plan: Return if symptoms worsen or fail to improve. Assunta Found, MD [place]

## 2017-05-29 NOTE — Patient Instructions (Addendum)
Nasal congestion: Netipot with distilled water 2-3 times a day to clear out sinuses Or Normal saline nasal spray Flonase steroid nasal spray  Antihistamine such as claritin  For sore throat can use: Ibuprofen 400- 600mg  three times a day Throat lozenges chloroseptic spray  Stick with bland foods Drink lots of fluids   Upper Respiratory Infection, Adult Most upper respiratory infections (URIs) are caused by a virus. A URI affects the nose, throat, and upper air passages. The most common type of URI is often called "the common cold." Follow these instructions at home:  Take medicines only as told by your doctor.  Gargle warm saltwater or take cough drops to comfort your throat as told by your doctor.  Use a warm mist humidifier or inhale steam from a shower to increase air moisture. This may make it easier to breathe.  Drink enough fluid to keep your pee (urine) clear or pale yellow.  Eat soups and other clear broths.  Have a healthy diet.  Rest as needed.  Go back to work when your fever is gone or your doctor says it is okay. ? You may need to stay home longer to avoid giving your URI to others. ? You can also wear a face mask and wash your hands often to prevent spread of the virus.  Use your inhaler more if you have asthma.  Do not use any tobacco products, including cigarettes, chewing tobacco, or electronic cigarettes. If you need help quitting, ask your doctor.

## 2017-06-18 ENCOUNTER — Encounter: Payer: Self-pay | Admitting: Family

## 2017-06-18 ENCOUNTER — Ambulatory Visit (INDEPENDENT_AMBULATORY_CARE_PROVIDER_SITE_OTHER): Payer: Medicare Other | Admitting: Family

## 2017-06-18 VITALS — BP 118/78 | HR 73 | Temp 98.3°F | Ht 69.0 in | Wt 213.8 lb

## 2017-06-18 DIAGNOSIS — J0100 Acute maxillary sinusitis, unspecified: Secondary | ICD-10-CM | POA: Diagnosis not present

## 2017-06-18 DIAGNOSIS — M5441 Lumbago with sciatica, right side: Secondary | ICD-10-CM | POA: Diagnosis not present

## 2017-06-18 MED ORDER — AMOXICILLIN-POT CLAVULANATE 875-125 MG PO TABS: 1.0000 | ORAL_TABLET | Freq: Two times a day (BID) | ORAL | 0 refills | Status: DC

## 2017-06-18 MED ORDER — PREDNISONE 10 MG (21) PO TBPK: ORAL_TABLET | ORAL | 0 refills | Status: DC

## 2017-06-18 NOTE — Patient Instructions (Signed)

## 2017-06-18 NOTE — Progress Notes (Signed)
Subjective:    Patient ID: Tyler Barton, male    DOB: 05/27/1971, 46 y.o.   MRN: [id number]  Otalgia   There is pain in the right ear. This is a new problem. The current episode started in the past 7 days. The problem occurs every few minutes. The problem has been gradually worsening. There has been no fever. The pain is at a severity of 6/10. The pain is mild. Associated symptoms include coughing, headaches, hearing loss and rhinorrhea. Pertinent negatives include no sore throat. He has tried acetaminophen for the symptoms. The treatment provided mild relief.  Back Pain  This is a recurrent problem. The current episode started more than 1 month ago. The problem occurs intermittently. The problem has been waxing and waning since onset. The quality of the pain is described as aching. The pain radiates to the right thigh. The pain is at a severity of 9/10. The pain is moderate. Associated symptoms include headaches, leg pain and tingling.      Review of Systems  HENT: Positive for ear pain, hearing loss and rhinorrhea. Negative for sore throat.   Respiratory: Positive for cough.   Musculoskeletal: Positive for back pain.  Neurological: Positive for tingling and headaches.  All other systems reviewed and are negative.      Objective:   Physical Exam  Constitutional: He is oriented to person, place, and time. He appears well-developed and well-nourished. No distress.  HENT:  Head: Normocephalic.  Right Ear: There is tenderness. Tympanic membrane is bulging.  Left Ear: There is tenderness. Tympanic membrane is bulging.  Nose: Mucosal edema and rhinorrhea present. Right sinus exhibits maxillary sinus tenderness and frontal sinus tenderness. Left sinus exhibits maxillary sinus tenderness and frontal sinus tenderness.  Mouth/Throat: Posterior oropharyngeal erythema present.  Eyes: Pupils are equal, round, and reactive to light. Right eye exhibits no discharge. Left eye exhibits no discharge.   Neck: Normal range of motion. Neck supple. No thyromegaly present.  Cardiovascular: Normal rate, regular rhythm, normal heart sounds and intact distal pulses.  No murmur heard. Pulmonary/Chest: Effort normal and breath sounds normal. No respiratory distress. He has no wheezes.  Abdominal: Soft. Bowel sounds are normal. He exhibits no distension. There is no tenderness.  Musculoskeletal: He exhibits tenderness (in lower back with extension and flexion, negative SLR). He exhibits no edema.  Neurological: He is alert and oriented to person, place, and time. He has normal reflexes. No cranial nerve deficit.  Skin: Skin is warm and dry. No rash noted. No erythema.  Psychiatric: He has a normal mood and affect. His behavior is normal. Judgment and thought content normal.  Vitals reviewed.     BP 118/78   Pulse 73   Temp 98.3 F (36.8 C) (Oral)   Ht 5\' 9"  (1.753 m)   Wt 213 lb 12.8 oz (97 kg)   BMI 31.57 kg/m     Assessment & Plan:  1. Acute maxillary sinusitis, recurrence not specified - Take meds as prescribed - Use a cool mist humidifier  -Use saline nose sprays frequently -Force fluids -For any cough or congestion  Use plain Mucinex- regular strength or max strength is fine -For fever or aces or pains- take tylenol or ibuprofen appropriate for age and weight. -Throat lozenges if help - amoxicillin-clavulanate (AUGMENTIN) 875-125 MG tablet; Take 1 tablet by mouth 2 (two) times daily.  Dispense: 14 tablet; Refill: 0 - predniSONE (STERAPRED UNI-PAK 21 TAB) 10 MG (21) TBPK tablet; Use as directed  Dispense: 21  tablet; Refill: 0  2. Acute right-sided low back pain with right-sided sciatica Rest Ice  ROM exercises  - predniSONE (STERAPRED UNI-PAK 21 TAB) 10 MG (21) TBPK tablet; Use as directed  Dispense: 21 tablet; Refill: 0   Evelina Dun, FNP

## 2017-07-15 ENCOUNTER — Encounter: Payer: Self-pay | Admitting: Family

## 2017-07-15 ENCOUNTER — Ambulatory Visit (INDEPENDENT_AMBULATORY_CARE_PROVIDER_SITE_OTHER): Payer: Medicare Other | Admitting: Family

## 2017-07-15 VITALS — BP 132/85 | HR 83 | Temp 99.8°F | Ht 69.0 in | Wt 214.0 lb

## 2017-07-15 DIAGNOSIS — R6889 Other general symptoms and signs: Secondary | ICD-10-CM

## 2017-07-15 LAB — VERITOR FLU A/B WAIVED
INFLUENZA B: NEGATIVE
Influenza A: NEGATIVE

## 2017-07-15 MED ORDER — OSELTAMIVIR PHOSPHATE 75 MG PO CAPS: 75.0000 mg | ORAL_CAPSULE | Freq: Two times a day (BID) | ORAL | 0 refills | Status: DC

## 2017-07-15 NOTE — Progress Notes (Signed)
   Subjective:    Patient ID: Tyler Barton, male    DOB: 01-23-1971, 47 y.o.   MRN: [id number]  Cough  This is a new problem. The current episode started yesterday. The problem has been unchanged. The problem occurs every few minutes. The cough is productive of sputum. Associated symptoms include chills, ear congestion, ear pain (right), a fever, headaches, myalgias, nasal congestion, postnasal drip, a sore throat and shortness of breath. Pertinent negatives include no rhinorrhea or wheezing. He has tried rest and OTC cough suppressant for the symptoms. The treatment provided mild relief.      Review of Systems  Constitutional: Positive for chills and fever.  HENT: Positive for ear pain (right), postnasal drip and sore throat. Negative for rhinorrhea.   Respiratory: Positive for cough and shortness of breath. Negative for wheezing.   Musculoskeletal: Positive for myalgias.  Neurological: Positive for headaches.  All other systems reviewed and are negative.      Objective:   Physical Exam  Constitutional: He is oriented to person, place, and time. He appears well-developed and well-nourished. No distress.  HENT:  Head: Normocephalic.  Right Ear: External ear normal.  Left Ear: External ear normal.  Nose: Mucosal edema and rhinorrhea present.  Mouth/Throat: Posterior oropharyngeal erythema present.  Eyes: Pupils are equal, round, and reactive to light. Right eye exhibits no discharge. Left eye exhibits no discharge.  Neck: Normal range of motion. Neck supple. No thyromegaly present.  Cardiovascular: Normal rate, regular rhythm, normal heart sounds and intact distal pulses.  No murmur heard. Pulmonary/Chest: Effort normal and breath sounds normal. No respiratory distress. He has no wheezes.  Abdominal: Soft. Bowel sounds are normal. He exhibits no distension. There is no tenderness.  Musculoskeletal: Normal range of motion. He exhibits no edema or tenderness.  Neurological: He is  alert and oriented to person, place, and time.  Skin: Skin is warm and dry. No rash noted. No erythema.  Psychiatric: He has a normal mood and affect. His behavior is normal. Judgment and thought content normal.  Vitals reviewed.   BP 132/85   Pulse 83   Temp 99.8 F (37.7 C) (Oral)   Ht 5\' 9"  (1.753 m)   Wt 214 lb (97.1 kg)   BMI 31.60 kg/m      Assessment & Plan:  1. Flu-like symptoms Will treat as flu Rest Good hand hygiene discussed Force fluids RTO prn or if symptoms worsen or do not improve - Veritor Flu A/B Waived - oseltamivir (TAMIFLU) 75 MG capsule; Take 1 capsule (75 mg total) by mouth 2 (two) times daily.  Dispense: 10 capsule; Refill: 0   Evelina Dun, FNP

## 2017-07-15 NOTE — Patient Instructions (Signed)

## 2017-08-09 ENCOUNTER — Encounter: Payer: Self-pay | Admitting: Nurse Practitioner

## 2017-08-09 ENCOUNTER — Ambulatory Visit (INDEPENDENT_AMBULATORY_CARE_PROVIDER_SITE_OTHER): Payer: Medicare Other | Admitting: Nurse Practitioner

## 2017-08-09 VITALS — BP 108/73 | HR 78 | Temp 98.3°F | Ht 69.0 in | Wt 209.0 lb

## 2017-08-09 DIAGNOSIS — Z0000 Encounter for general adult medical examination without abnormal findings: Secondary | ICD-10-CM

## 2017-08-09 DIAGNOSIS — F3342 Major depressive disorder, recurrent, in full remission: Secondary | ICD-10-CM

## 2017-08-09 DIAGNOSIS — Z125 Encounter for screening for malignant neoplasm of prostate: Secondary | ICD-10-CM | POA: Diagnosis not present

## 2017-08-09 DIAGNOSIS — K582 Mixed irritable bowel syndrome: Secondary | ICD-10-CM

## 2017-08-09 DIAGNOSIS — F410 Panic disorder [episodic paroxysmal anxiety] without agoraphobia: Secondary | ICD-10-CM

## 2017-08-09 DIAGNOSIS — K580 Irritable bowel syndrome with diarrhea: Secondary | ICD-10-CM | POA: Diagnosis not present

## 2017-08-09 DIAGNOSIS — F419 Anxiety disorder, unspecified: Secondary | ICD-10-CM | POA: Diagnosis not present

## 2017-08-09 MED ORDER — CITALOPRAM HYDROBROMIDE 40 MG PO TABS: 40.0000 mg | ORAL_TABLET | Freq: Every day | ORAL | 5 refills | Status: DC

## 2017-08-09 MED ORDER — DIAZEPAM 5 MG PO TABS: 5.0000 mg | ORAL_TABLET | Freq: Two times a day (BID) | ORAL | 2 refills | Status: DC | PRN

## 2017-08-09 NOTE — Patient Instructions (Signed)

## 2017-08-09 NOTE — Progress Notes (Signed)
Subjective:    Patient ID: Tyler Barton, male    DOB: [birth year], 47 y.o.   MRN: [id number]  HPI  Tyler Barton is here today for annual physical exam and  follow up of chronic medical problem.  Outpatient Encounter Medications as of 08/09/2017  Medication Sig  . citalopram (CELEXA) 40 MG tablet Take 1 tablet (40 mg total) by mouth daily.  . diazepam (VALIUM) 5 MG tablet Take 1 tablet (5 mg total) by mouth every 12 (twelve) hours as needed. for anxiety  . loratadine (CLARITIN) 10 MG tablet Take 1 tablet (10 mg total) by mouth daily. Reported on 07/10/2015     1. Annual physical exam   2. Irritable bowel syndrome with both constipation and diarrhea  Doing well. He says he just uses OTC meds when needed depending on wether he is having diarrhea or constipation.  3. Panic attacks  He is on valium which is working well to control panic attacks.  4. Recurrent major depressive disorder, in full remission [facility])  He is currently on celexa which s working well for him. Depression screen [facility] 2/9 08/09/2017 07/15/2017 06/18/2017  Decreased Interest 0 0 0  Down, Depressed, Hopeless 0 1 0  PHQ - 2 Score 0 1 0  Altered sleeping - - -  Tired, decreased energy - - -  Change in appetite - - -  Feeling bad or failure about yourself  - - -  Trouble concentrating - - -  Moving slowly or fidgety/restless - - -  Suicidal thoughts - - -  PHQ-9 Score - - -       New complaints: None today  Social history: Is on disabilty-     Review of Systems  Constitutional: Negative for activity change and appetite change.  HENT: Negative.   Eyes: Negative for pain.  Respiratory: Negative for shortness of breath.   Cardiovascular: Negative for chest pain, palpitations and leg swelling.  Gastrointestinal: Negative for abdominal pain.  Endocrine: Negative for polydipsia.  Genitourinary: Negative.   Skin: Negative for rash.  Neurological: Negative for dizziness, weakness and headaches.  Hematological:  Does not bruise/bleed easily.  Psychiatric/Behavioral: Negative.   All other systems reviewed and are negative.      Objective:   Physical Exam  Constitutional: He is oriented to person, place, and time. He appears well-developed and well-nourished.  HENT:  Head: Normocephalic.  Right Ear: External ear normal.  Left Ear: External ear normal.  Nose: Nose normal.  Mouth/Throat: Oropharynx is clear and moist.  Eyes: EOM are normal. Pupils are equal, round, and reactive to light.  Neck: Normal range of motion. Neck supple. No JVD present. No thyromegaly present.  Cardiovascular: Normal rate, regular rhythm, normal heart sounds and intact distal pulses. Exam reveals no gallop and no friction rub.  No murmur heard. Pulmonary/Chest: Effort normal and breath sounds normal. No respiratory distress. He has no wheezes. He has no rales. He exhibits no tenderness.  Abdominal: Soft. Bowel sounds are normal. He exhibits no mass. There is no tenderness.  Genitourinary: Prostate normal and penis normal.  Musculoskeletal: Normal range of motion. He exhibits no edema.  Lymphadenopathy:    He has no cervical adenopathy.  Neurological: He is alert and oriented to person, place, and time. No cranial nerve deficit.  Skin: Skin is warm and dry.  Psychiatric: He has a normal mood and affect. His behavior is normal. Judgment and thought content normal.   BP 108/73   Pulse 78  Temp 98.3 F (36.8 C) (Oral)   Ht _0  (1.753 m)   Wt 209 lb (94.8 kg)   BMI 30.86 kg/m       Assessment & Plan:  1. Annual physical exam - Lipid panel - CMP14+EGFR - Thyroid Panel With TSH - CBC with Differential/Platelet - PSA, total and free  2. Irritable bowel syndrome with both constipation and diarrhea Watch diet  3. Panic attacks Stress management - diazepam (VALIUM) 5 MG tablet; Take 1 tablet (5 mg total) by mouth every 12 (twelve) hours as needed. for anxiety  Dispense: 60 tablet; Refill: 2  4. Recurrent  major depressive disorder, in full remission ([place]) - citalopram (CELEXA) 40 MG tablet; Take 1 tablet (40 mg total) by mouth daily.  Dispense: 30 tablet; Refill: 5  5. Prostate cancer screening - PSA, total and free    Labs pending Health maintenance reviewed Diet and exercise encouraged Continue all meds Follow up  In 6 months   [place], FNP

## 2017-08-11 LAB — LIPID PANEL
CHOLESTEROL TOTAL: 150 mg/dL (ref 100–199)
Chol/HDL Ratio: 4.2 ratio (ref 0.0–5.0)
HDL: 36 mg/dL — ABNORMAL LOW (ref >39)
LDL CALC: 99 mg/dL (ref 0–99)
Triglycerides: 77 mg/dL (ref 0–149)
VLDL Cholesterol Cal: 15 mg/dL (ref 5–40)

## 2017-08-11 LAB — CBC WITH DIFFERENTIAL/PLATELET
BASOS ABS: 0 10*3/uL (ref 0.0–0.2)
Basos: 0 %
EOS (ABSOLUTE): 0.4 10*3/uL (ref 0.0–0.4)
Eos: 8 %
Hematocrit: 43.3 % (ref 37.5–51.0)
Hemoglobin: 14.4 g/dL (ref 13.0–17.7)
IMMATURE GRANULOCYTES: 0 %
Immature Grans (Abs): 0 10*3/uL (ref 0.0–0.1)
Lymphocytes Absolute: 1.6 10*3/uL (ref 0.7–3.1)
Lymphs: 30 %
MCH: 30 pg (ref 26.6–33.0)
MCHC: 33.3 g/dL (ref 31.5–35.7)
MCV: 90 fL (ref 79–97)
MONOS ABS: 0.6 10*3/uL (ref 0.1–0.9)
Monocytes: 11 %
NEUTROS PCT: 51 %
Neutrophils Absolute: 2.8 10*3/uL (ref 1.4–7.0)
PLATELETS: 346 10*3/uL (ref 150–379)
RBC: 4.8 x10E6/uL (ref 4.14–5.80)
RDW: 14.5 % (ref 12.3–15.4)
WBC: 5.5 10*3/uL (ref 3.4–10.8)

## 2017-08-11 LAB — CMP14+EGFR
ALT: 25 IU/L (ref 0–44)
AST: 28 IU/L (ref 0–40)
Albumin/Globulin Ratio: 2.2 (ref 1.2–2.2)
Albumin: 4.3 g/dL (ref 3.5–5.5)
Alkaline Phosphatase: 31 IU/L — ABNORMAL LOW (ref 39–117)
BUN/Creatinine Ratio: 9 (ref 9–20)
BUN: 10 mg/dL (ref 6–24)
Bilirubin Total: 0.6 mg/dL (ref 0.0–1.2)
CALCIUM: 9.5 mg/dL (ref 8.7–10.2)
CO2: 23 mmol/L (ref 20–29)
CREATININE: 1.06 mg/dL (ref 0.76–1.27)
Chloride: 106 mmol/L (ref 96–106)
GFR calc Af Amer: 96 mL/min/{1.73_m2} (ref >59)
GFR, EST NON AFRICAN AMERICAN: 83 mL/min/{1.73_m2} (ref >59)
GLOBULIN, TOTAL: 2 g/dL (ref 1.5–4.5)
GLUCOSE: 94 mg/dL (ref 65–99)
Potassium: 4.2 mmol/L (ref 3.5–5.2)
SODIUM: 144 mmol/L (ref 134–144)
Total Protein: 6.3 g/dL (ref 6.0–8.5)

## 2017-08-11 LAB — PSA, TOTAL AND FREE
PSA FREE PCT: 60 %
PSA FREE: 0.42 ng/mL
Prostate Specific Ag, Serum: 0.7 ng/mL (ref 0.0–4.0)

## 2017-08-11 LAB — THYROID PANEL WITH TSH
FREE THYROXINE INDEX: 1.8 (ref 1.2–4.9)
T3 UPTAKE RATIO: 24 % (ref 24–39)
T4 TOTAL: 7.7 ug/dL (ref 4.5–12.0)
TSH: 0.566 u[IU]/mL (ref 0.450–4.500)

## 2017-08-12 NOTE — Addendum Note (Signed)
Addended by: Chevis Pretty on: 08/12/2017 02:08 PM   Modules accepted: Level of Service

## 2017-09-01 ENCOUNTER — Ambulatory Visit: Payer: Medicare Other | Admitting: *Deleted

## 2017-10-27 ENCOUNTER — Ambulatory Visit: Payer: Medicare Other | Admitting: Family

## 2017-10-27 ENCOUNTER — Ambulatory Visit (INDEPENDENT_AMBULATORY_CARE_PROVIDER_SITE_OTHER): Payer: Medicare Other

## 2017-10-27 ENCOUNTER — Encounter: Payer: Self-pay | Admitting: Family

## 2017-10-27 ENCOUNTER — Ambulatory Visit (INDEPENDENT_AMBULATORY_CARE_PROVIDER_SITE_OTHER): Payer: Medicare Other | Admitting: Family

## 2017-10-27 VITALS — BP 120/86 | HR 81 | Temp 97.7°F | Ht 69.0 in | Wt 213.0 lb

## 2017-10-27 DIAGNOSIS — M25562 Pain in left knee: Secondary | ICD-10-CM | POA: Diagnosis not present

## 2017-10-27 DIAGNOSIS — G8929 Other chronic pain: Secondary | ICD-10-CM | POA: Diagnosis not present

## 2017-10-27 DIAGNOSIS — M1712 Unilateral primary osteoarthritis, left knee: Secondary | ICD-10-CM | POA: Diagnosis not present

## 2017-10-27 MED ORDER — DICLOFENAC SODIUM 75 MG PO TBEC: 75.0000 mg | DELAYED_RELEASE_TABLET | Freq: Two times a day (BID) | ORAL | 0 refills | Status: DC

## 2017-10-27 NOTE — Progress Notes (Signed)
   Subjective:    Patient ID: Tyler Barton, male    DOB: 1971/05/22, 47 y.o.   MRN: [id number]  Knee Pain   The incident occurred more than 1 week ago. The pain is present in the left knee. The quality of the pain is described as aching. The pain is at a severity of 9/10. The pain is moderate. The pain has been constant since onset. Associated symptoms include an inability to bear weight. Pertinent negatives include no numbness or tingling. He reports no foreign bodies present. The symptoms are aggravated by movement. He has tried acetaminophen and non-weight bearing for the symptoms. The treatment provided mild relief.      Review of Systems  Neurological: Negative for tingling and numbness.  All other systems reviewed and are negative.      Objective:   Physical Exam  Constitutional: He is oriented to person, place, and time. He appears well-developed and well-nourished. No distress.  HENT:  Head: Normocephalic.  Cardiovascular: Normal rate, regular rhythm, normal heart sounds and intact distal pulses.  No murmur heard. Pulmonary/Chest: Effort normal and breath sounds normal. No respiratory distress. He has no wheezes.  Abdominal: Soft. Bowel sounds are normal. He exhibits no distension. There is no tenderness.  Musculoskeletal: He exhibits tenderness. He exhibits no edema.  Trace swelling in left knee, pain in posterior knee with flexion and internal rotation  Neurological: He is alert and oriented to person, place, and time.  Skin: Skin is warm and dry. No rash noted. No erythema.  Psychiatric: He has a normal mood and affect. His behavior is normal. Judgment and thought content normal.  Vitals reviewed.     BP 120/86   Pulse 81   Temp 97.7 F (36.5 C) (Oral)   Ht 5\' 9"  (1.753 m)   Wt 213 lb (96.6 kg)   BMI 31.45 kg/m      Assessment & Plan:  1. Chronic pain of left knee Rest Ice Referral to ortho  - diclofenac (VOLTAREN) 75 MG EC tablet; Take 1 tablet (75 mg  total) by mouth 2 (two) times daily.  Dispense: 30 tablet; Refill: 0 - DG Knee 1-2 Views Left; Future - Ambulatory referral to [place], FNP

## 2017-10-27 NOTE — Patient Instructions (Signed)
Anterior Cruciate Ligament Tear Ligaments are strong bands of tissue that connect bones to each other. The anterior cruciate ligament (ACL) connects your shin bone to your thigh bone. A tear in this ligament can cause pain and make it hard for you to put weight on your leg (use your leg to support your body weight). There are three types of ACL injuries:  Grade 1. In this type, the ligament is stretched.  Grade 2. In this type, the ligament is partially torn.  Grade 3. In this type, the ligament is completely torn.  What are the causes? This condition happens when too much pressure is put on the ACL. It may happen if:  You twist your knee, especially with your foot planted.  You make a quick change in direction (cut and pivot).  You slow down quickly while running.  You land a jump without bending your knee.  You forcefully straighten your knee more than normal (hyperextend your knee).  You are hit in the knee.  You hit your knee on the ground.  What increases the risk? This condition is more likely to develop in:  Women.  People who play sports that involve jumping or changing directions often. These include: ? Football. ? Basketball. ? Volleyball. ? Soccer. ? Skiing. ? Hockey. ? Gymnastics  What are the signs or symptoms? Symptoms of this condition include:  A popping sound at the time of injury.  A feeling that your knee is bending abnormally at the time of injury.  Pain.  Swelling.  Tenderness.  Instability when you try to put weight on your injured leg.  Inability to move your knee as far as normal.  Difficulty walking.  How is this diagnosed? This condition may be diagnosed based on:  Your symptoms.  Your medical history.  A physical exam.  Tests, such as: ? An X-ray. This may be done to check for bone injuries. ? MRI. This may be done to see if the tear is partial or complete and to check for additional injuries.  During your physical  exam, your provider will test your knee to see if it moves more than it should (laxity). How is this treated? This condition may be treated with:  Resting your knee.  Avoiding activities that cause pain, instability, or swelling.  Raising (elevating) your knee while resting.  Keeping weight off your leg until pain and swelling improve. You may need to use crutches or a walker.  Icing your knee.  Taking medicine to reduce pain and swelling.  Wearing a knee brace.  Doing range-of-motion, strengthening, and stretching exercises (physical therapy).  Surgery. This may be needed if you are very active and have a complete tear.  Follow these instructions at home: If you have a brace:  Wear it as told by your health care provider. Remove it only as told by your health care provider.  Loosen the brace if your toes tingle, become numb, or turn cold and blue.  Do not let your brace get wet if it is not waterproof.  Keep the brace clean. Managing pain, stiffness, and swelling  If directed, put ice on your knee: ? Put ice in a plastic bag. ? Place a towel between your skin and the bag. ? Leave the ice on for 20 minutes, 2-3 times a day.  Move your foot often to avoid stiffness and to lessen swelling.  Elevate your knee above the level of your heart while you are sitting or lying down. Driving    Do not drive or operate heavy machinery while taking prescription pain medicine.  Ask your health care provider when it is safe to drive if you have a brace on your leg. Activity  Return to your normal activities as told by your health care provider. Ask your health care provider what activities are safe for you.  Do exercises as told by your health care provider. General instructions  Do not use the injured limb to support your body weight until your health care provider says that you can. Use crutches or a walker as told by your health care provider.  Do not use any tobacco  products, such as cigarettes, chewing tobacco, and e-cigarettes. Tobacco can delay healing. If you need help quitting, ask your health care provider.  Take over-the-counter and prescription medicines only as told by your health care provider.  Keep all follow-up visits as told by your health care provider. This is important. How is this prevented?  Warm up and stretch before being active.  Cool down and stretch after being active.  Give your body time to rest between periods of activity.  Make sure to use equipment that fits you.  If you wear cleats, make sure they are appropriate for your playing surface.  Be safe and responsible while being active to avoid falls.  Maintain physical fitness, including: ? Strength. ? Flexibility. Contact a health care provider if:  Your symptoms are not improving.  Your symptoms are getting worse. This information is not intended to replace advice given to you by your health care provider. Make sure you discuss any questions you have with your health care provider. Document Released: 01/21/2005 Document Revised: 02/25/2016 Document Reviewed: 06/08/2015 Elsevier Interactive Patient Education  2017 Elsevier Inc.  

## 2017-10-29 ENCOUNTER — Encounter: Payer: Self-pay | Admitting: *Deleted

## 2017-11-01 ENCOUNTER — Ambulatory Visit (INDEPENDENT_AMBULATORY_CARE_PROVIDER_SITE_OTHER): Payer: Medicare Other | Admitting: Orthopaedic Surgery

## 2017-11-01 ENCOUNTER — Encounter (INDEPENDENT_AMBULATORY_CARE_PROVIDER_SITE_OTHER): Payer: Self-pay | Admitting: Orthopaedic Surgery

## 2017-11-01 DIAGNOSIS — G8929 Other chronic pain: Secondary | ICD-10-CM | POA: Diagnosis not present

## 2017-11-01 DIAGNOSIS — M25562 Pain in left knee: Secondary | ICD-10-CM

## 2017-11-01 NOTE — Progress Notes (Signed)
Office Visit Note   Patient: Tyler Barton           Date of Birth: 1970/12/13           MRN: [id number] Visit Date: 11/01/2017              Requested by: Sharion Balloon, [place], [place] [id number] PCP: Chevis Pretty, FNP   Assessment & Plan: Visit Diagnoses:  1. Chronic pain of left knee     Plan: I do feel that he is a perfect candidate for trying an intra-articular steroid injection.  I explained this to him in detail and explained the risk and benefits of injections.  He tolerated it well.  He may be a candidate later for hyaluronic acid.  I would like to see him back though in 2 weeks to see how is doing overall.  He is to work on activity modification and quad training exercises as well as continue his oral anti-inflammatories.  All questions and concerns were answered and addressed.  Follow-Up Instructions: Return in about 2 weeks (around 11/15/2017).   Orders:  No orders of the defined types were placed in this encounter.  No orders of the defined types were placed in this encounter.     Procedures: No procedures performed   Clinical Data: No additional findings.   Subjective: Chief Complaint  Patient presents with  . Left Knee - Pain  Patient somewhat I am seeing for the first time.  He is a very pleasant 46 year old active individual with slowly worsening left knee pain for years now.  He does wake him up at night.  Few weeks ago he was doing a lot of yard work for [employer] and it started hurting him even more so.  He says it does pop and give out at times.  It does wake him up at night.  Sometimes at night is when it is the worst.  He had a history of a left knee arthroscopy in the 1990s.  He has been on oral anti-inflammatory.  He has not had any type of injection in that knee.  It is not been swelling on either.  HPI  Review of Systems He currently denies any headache, chest pain, shortness of breath, fever, chills, nausea,  vomiting.  Objective: Vital Signs: There were no vitals taken for this visit.  Physical Exam Is alert and oriented x3 and in no acute distress Ortho Exam Examination of his left knee shows no significant swelling or effusion.  He has just some slight medial lateral joint line tenderness but full range of motion with a negative McMurray and negative Lockman exam. Specialty Comments:  No specialty comments available.  Imaging: No results found. X-rays on the canopy system independently reviewed of his left knee show no acute findings.  There is a mild effusion on the plain films.  There is mild patellofemoral arthritic changes and just slight joint space narrowing.  PMFS History: Patient Active Problem List   Diagnosis Date Noted  . Chronic pain of left knee 11/01/2017  . Recurrent major depressive disorder, in full remission ([place]) 05/07/2017  . Panic attacks 02/12/2017  . IBS (irritable bowel syndrome) 11/21/2014   Past Medical History:  Diagnosis Date  . Anxiety   . DDD (degenerative disc disease)     Family History  Problem Relation Age of Onset  . Anxiety disorder Mother   . Anxiety disorder Father   . Anxiety disorder Sister  Past Surgical History:  Procedure Laterality Date  . KNEE ARTHROSCOPY Left 1993  . LIPOMA EXCISION     Social History   Occupational History  . Not on file  Tobacco Use  . Smoking status: Never Smoker  . Smokeless tobacco: Never Used  Substance and Sexual Activity  . Alcohol use: No  . Drug use: No  . Sexual activity: Not Currently

## 2017-11-15 ENCOUNTER — Ambulatory Visit (INDEPENDENT_AMBULATORY_CARE_PROVIDER_SITE_OTHER): Payer: Medicare Other | Admitting: Orthopaedic Surgery

## 2017-12-23 ENCOUNTER — Other Ambulatory Visit: Payer: Self-pay

## 2017-12-23 ENCOUNTER — Telehealth: Payer: Self-pay | Admitting: Nurse Practitioner

## 2017-12-23 DIAGNOSIS — F3342 Major depressive disorder, recurrent, in full remission: Secondary | ICD-10-CM

## 2017-12-23 DIAGNOSIS — F410 Panic disorder [episodic paroxysmal anxiety] without agoraphobia: Secondary | ICD-10-CM

## 2017-12-23 MED ORDER — CITALOPRAM HYDROBROMIDE 40 MG PO TABS: 40.0000 mg | ORAL_TABLET | Freq: Every day | ORAL | 1 refills | Status: DC

## 2017-12-23 MED ORDER — DIAZEPAM 5 MG PO TABS: 5.0000 mg | ORAL_TABLET | Freq: Two times a day (BID) | ORAL | 1 refills | Status: DC | PRN

## 2017-12-23 NOTE — Telephone Encounter (Signed)
Last seen 10/27/17

## 2018-02-09 ENCOUNTER — Ambulatory Visit: Payer: Medicare Other | Admitting: Family Medicine

## 2018-02-09 ENCOUNTER — Encounter: Payer: Self-pay | Admitting: Family Medicine

## 2018-02-09 VITALS — BP 120/72 | HR 72 | Temp 97.3°F | Ht 69.0 in | Wt 206.2 lb

## 2018-02-09 DIAGNOSIS — Z024 Encounter for examination for driving license: Secondary | ICD-10-CM

## 2018-02-09 DIAGNOSIS — Z0000 Encounter for general adult medical examination without abnormal findings: Secondary | ICD-10-CM

## 2018-02-09 NOTE — Progress Notes (Signed)
Subjective:  Patient ID: Tyler Barton, male    DOB: July 10, 1970  Age: 47 y.o. MRN: [id number]  CC: Private DOT Physical   HPI Tyler Barton presents for DOT medical clearance.  Depression screen [facility] 2/9 02/09/2018 10/27/2017 08/09/2017  Decreased Interest 0 0 0  Down, Depressed, Hopeless 0 0 0  PHQ - 2 Score 0 0 0  Altered sleeping - - -  Tired, decreased energy - - -  Change in appetite - - -  Feeling bad or failure about yourself  - - -  Trouble concentrating - - -  Moving slowly or fidgety/restless - - -  Suicidal thoughts - - -  PHQ-9 Score - - -    History Marshal has a past medical history of Anxiety and DDD (degenerative disc disease).   He has a past surgical history that includes Knee arthroscopy (Left, 1993) and Lipoma excision.   His family history includes Anxiety disorder in his father, mother, and sister.He reports that he has never smoked. He has never used smokeless tobacco. He reports that he does not drink alcohol or use drugs.    ROS Review of Systems  Constitutional: Negative.   HENT: Negative.   Eyes: Negative for visual disturbance.  Respiratory: Negative for cough and shortness of breath.   Cardiovascular: Negative for chest pain and leg swelling.  Gastrointestinal: Negative for abdominal pain, diarrhea, nausea and vomiting.  Genitourinary: Negative for difficulty urinating.  Musculoskeletal: Negative for arthralgias and myalgias.  Skin: Negative for rash.  Neurological: Negative for headaches.  Psychiatric/Behavioral: Negative for sleep disturbance.    Objective:  BP 120/72   Pulse 72   Temp (!) 97.3 F (36.3 C) (Oral)   Ht 5\' 9"  (1.753 m)   Wt 206 lb 4 oz (93.6 kg)   BMI 30.46 kg/m   BP Readings from Last 3 Encounters:  02/09/18 120/72  10/27/17 120/86  08/09/17 108/73    Wt Readings from Last 3 Encounters:  02/09/18 206 lb 4 oz (93.6 kg)  10/27/17 213 lb (96.6 kg)  08/09/17 209 lb (94.8 kg)     Physical Exam  Constitutional:  He is oriented to person, place, and time. He appears well-developed and well-nourished. No distress.  HENT:  Head: Normocephalic and atraumatic.  Right Ear: External ear normal.  Left Ear: External ear normal.  Nose: Nose normal.  Mouth/Throat: Oropharynx is clear and moist.  Eyes: Pupils are equal, round, and reactive to light. Conjunctivae and EOM are normal.  Neck: Normal range of motion. Neck supple.  Cardiovascular: Normal rate, regular rhythm and normal heart sounds.  No murmur heard. Pulmonary/Chest: Effort normal and breath sounds normal. No respiratory distress. He has no wheezes. He has no rales.  Abdominal: Soft. There is no tenderness.  Musculoskeletal: Normal range of motion.  Neurological: He is alert and oriented to person, place, and time. He has normal reflexes.  Skin: Skin is warm and dry.  Psychiatric: He has a normal mood and affect. His behavior is normal. Judgment and thought content normal.      Assessment & Plan:   [place] was seen today for private dot physical.  Diagnoses and all orders for this visit:  Annual physical exam  Encounter for Department of Transportation (DOT) examination for driving license renewal       I have discontinued Loralee Pacas. Labelle's diclofenac. I am also having him maintain his loratadine, citalopram, and diazepam.  Allergies as of 02/09/2018   No Known Allergies  Medication List        Accurate as of 02/09/18  8:38 AM. Always use your most recent med list.          citalopram 40 MG tablet Commonly known as:  CELEXA Take 1 tablet (40 mg total) by mouth daily.   diazepam 5 MG tablet Commonly known as:  VALIUM Take 1 tablet (5 mg total) by mouth every 12 (twelve) hours as needed. for anxiety   loratadine 10 MG tablet Commonly known as:  CLARITIN Take 1 tablet (10 mg total) by mouth daily. Reported on 07/10/2015        Follow-up: Return in about 2 years (around 02/10/2020).  Claretta Fraise, M.D.

## 2018-02-21 ENCOUNTER — Encounter: Payer: Self-pay | Admitting: Nurse Practitioner

## 2018-02-21 ENCOUNTER — Ambulatory Visit (INDEPENDENT_AMBULATORY_CARE_PROVIDER_SITE_OTHER): Payer: Medicare Other | Admitting: Nurse Practitioner

## 2018-02-21 VITALS — BP 109/69 | HR 63 | Temp 97.3°F | Ht 69.0 in | Wt 205.0 lb

## 2018-02-21 DIAGNOSIS — F3342 Major depressive disorder, recurrent, in full remission: Secondary | ICD-10-CM | POA: Diagnosis not present

## 2018-02-21 DIAGNOSIS — F410 Panic disorder [episodic paroxysmal anxiety] without agoraphobia: Secondary | ICD-10-CM | POA: Diagnosis not present

## 2018-02-21 DIAGNOSIS — K582 Mixed irritable bowel syndrome: Secondary | ICD-10-CM

## 2018-02-21 MED ORDER — CITALOPRAM HYDROBROMIDE 40 MG PO TABS: 40.0000 mg | ORAL_TABLET | Freq: Every day | ORAL | 1 refills | Status: DC

## 2018-02-21 MED ORDER — DIAZEPAM 5 MG PO TABS: 5.0000 mg | ORAL_TABLET | Freq: Two times a day (BID) | ORAL | 2 refills | Status: DC | PRN

## 2018-02-21 NOTE — Progress Notes (Signed)
Subjective:    Patient ID: Tyler Barton, male    DOB: [birth year], 47 y.o.   MRN: [id number]   Chief Complaint: Medical Management of Chronic Issues   HPI:  1. Irritable bowel syndrome with both constipation and diarrhea  Doing pretty god with diet control.  2. Recurrent major depressive disorder, in full remission ([place])  He is current;y on celexa and valium. Says he cannot make it without his valium.  Depression screen [facility] 2/9 02/21/2018 02/09/2018 10/27/2017  Decreased Interest 1 0 0  Down, Depressed, Hopeless 1 0 0  PHQ - 2 Score 2 0 0  Altered sleeping 1 - -  Tired, decreased energy 1 - -  Change in appetite 1 - -  Feeling bad or failure about yourself  0 - -  Trouble concentrating 1 - -  Moving slowly or fidgety/restless 0 - -  Suicidal thoughts 0 - -  PHQ-9 Score 6 - -     3. Panic attacks  Valium prevents panic attacks    Outpatient Encounter Medications as of 02/21/2018  Medication Sig  . citalopram (CELEXA) 40 MG tablet Take 1 tablet (40 mg total) by mouth daily.  . diazepam (VALIUM) 5 MG tablet Take 1 tablet (5 mg total) by mouth every 12 (twelve) hours as needed. for anxiety  . loratadine (CLARITIN) 10 MG tablet Take 1 tablet (10 mg total) by mouth daily. Reported on 07/10/2015      New complaints: Fatigue- has no energy to do anythikng  Social history: Mother in law has been really sick and his wif is having to spend a lot of time with her and they have not got to spend much time together   Review of Systems  Constitutional: Negative for activity change and appetite change.  HENT: Negative.   Eyes: Negative for pain.  Respiratory: Negative for shortness of breath.   Cardiovascular: Negative for chest pain, palpitations and leg swelling.  Gastrointestinal: Negative for abdominal pain.  Endocrine: Negative for polydipsia.  Genitourinary: Negative.   Skin: Negative for rash.  Neurological: Negative for dizziness, weakness and headaches.  Hematological:  Does not bruise/bleed easily.  Psychiatric/Behavioral: Negative.   All other systems reviewed and are negative.      Objective:   Physical Exam  Constitutional: He is oriented to person, place, and time. He appears well-developed and well-nourished.  HENT:  Head: Normocephalic.  Nose: Nose normal.  Mouth/Throat: Oropharynx is clear and moist.  Eyes: Pupils are equal, round, and reactive to light. EOM are normal.  Neck: Normal range of motion and phonation normal. Neck supple. No JVD present. Carotid bruit is not present. No thyroid mass and no thyromegaly present.  Cardiovascular: Normal rate and regular rhythm.  Pulmonary/Chest: Effort normal and breath sounds normal. No respiratory distress.  Abdominal: Soft. Normal appearance, normal aorta and bowel sounds are normal. There is no tenderness.  Musculoskeletal: Normal range of motion.  Lymphadenopathy:    He has no cervical adenopathy.  Neurological: He is alert and oriented to person, place, and time.  Skin: Skin is warm and dry.  Psychiatric: He has a normal mood and affect. His behavior is normal. Judgment and thought content normal.  Nursing note and vitals reviewed.  BP 109/69   Pulse 63   Temp (!) 97.3 F (36.3 C) (Oral)   Ht 5\' 9"  (1.753 m)   Wt 205 lb (93 kg)   BMI 30.27 kg/m         Assessment & Plan:  Tyler Barton comes in today with chief complaint of Medical Management of Chronic Issues   Diagnosis and orders addressed:  1. Irritable bowel syndrome with both constipation and diarrhea Watch diet  2. Recurrent major depressive disorder, in full remission ([place]) Stress management - citalopram (CELEXA) 40 MG tablet; Take 1 tablet (40 mg total) by mouth daily.  Dispense: 90 tablet; Refill: 1  3. Panic attacks - diazepam (VALIUM) 5 MG tablet; Take 1 tablet (5 mg total) by mouth every 12 (twelve) hours as needed. for anxiety  Dispense: 60 tablet; Refill: 2  * patient is considering CBD oil Labs  pending Health Maintenance reviewed Diet and exercise encouraged  Follow up plan: Keep appointment for complete ohysical   Tyler Hassell Done, FNP

## 2018-03-02 ENCOUNTER — Ambulatory Visit: Payer: Medicare Other | Admitting: *Deleted

## 2018-03-08 ENCOUNTER — Ambulatory Visit (INDEPENDENT_AMBULATORY_CARE_PROVIDER_SITE_OTHER): Payer: Medicare Other | Admitting: *Deleted

## 2018-03-08 ENCOUNTER — Encounter: Payer: Self-pay | Admitting: *Deleted

## 2018-03-08 VITALS — BP 117/68 | HR 82 | Ht 68.5 in | Wt 208.0 lb

## 2018-03-08 DIAGNOSIS — Z0000 Encounter for general adult medical examination without abnormal findings: Secondary | ICD-10-CM | POA: Diagnosis not present

## 2018-03-08 NOTE — Patient Instructions (Signed)
  Tyler Barton , Thank you for taking time to come for your Medicare Wellness Visit. I appreciate your ongoing commitment to your health goals. Please review the following plan we discussed and let me know if I can assist you in the future.   These are the goals we discussed: Goals    . Exercise 150 min/wk Moderate Activity       This is a list of the screening recommended for you and due dates:  Health Maintenance  Topic Date Due  . Flu Shot  02/03/2018  . Tetanus Vaccine  03/06/2021  . HIV Screening  Completed

## 2018-03-08 NOTE — Progress Notes (Addendum)
Subjective:   Tyler Barton is a 47 y.o. male who presents for a Medicare Annual Wellness Visit. Tyler Barton lives at home with his wife. He has one adult daughter, one adult son, and two adult stepsons. 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  He enjoys Research officer, trade union and riding his 4-wheeler on his parent's property. They have a nice, peaceful area to ride.   Review of Systems    Patient reports that his overall health is unchanged compared to last year.  Cardiac Risk Factors include: male gender  Psych: Reports a significant amount of anxiety that affects his daily life. Celexa and diazepam help but if he has an event that causes significant stress it takes him several days to recover. He has tried counseling in the past but didn't feel a connection and felt that the gaps in between visits prevented any real progress. He did say that his wife was helpful but there has been some strain in their marriage that he has not been able to let go of. She made some changes at work that have helped him to be more comfortable but he's had a hard time completley letting go.   All other systems negative       Current Medications (verified) Outpatient Encounter Medications as of 03/08/2018  Medication Sig  . citalopram (CELEXA) 40 MG tablet Take 1 tablet (40 mg total) by mouth daily.  . diazepam (VALIUM) 5 MG tablet Take 1 tablet (5 mg total) by mouth every 12 (twelve) hours as needed. for anxiety  . loratadine (CLARITIN) 10 MG tablet Take 1 tablet (10 mg total) by mouth daily. Reported on 07/10/2015   No facility-administered encounter medications on file as of 03/08/2018.     Allergies (verified) Patient has no known allergies.   History: Past Medical History:  Diagnosis Date  . Anxiety   . DDD (degenerative disc disease)    Past Surgical History:  Procedure Laterality Date  . KNEE ARTHROSCOPY Left 1993  . LIPOMA EXCISION     Family History    Problem Relation Age of Onset  . Anxiety disorder Mother   . Anxiety disorder Father   . Heart disease Father 66       with stent placement and bypass  . Anxiety disorder Sister   . Cancer Maternal Uncle        throat  . Alzheimer's disease Maternal Grandmother   . Cancer Paternal Grandmother   . Heart disease Maternal Uncle   . Heart disease Maternal Uncle    Social History   Socioeconomic History  . Marital status: Married    Spouse name: Not on file  . Number of children: 2  . Years of education: 10  . Highest education level: 10th grade  Occupational History  . Occupation: disabled    Comment: truck Animator Needs  . Financial resource strain: Not hard at all  . Food insecurity:    Worry: Never true    Inability: Never true  . Transportation needs:    Medical: No    Non-medical: No  Tobacco Use  . Smoking status: Never Smoker  . Smokeless tobacco: Never Used  Substance and Sexual Activity  . Alcohol use: No  . Drug use: No  . Sexual activity: Not Currently  Lifestyle  . Physical activity:    Days per week: 5 days    Minutes per session: 60 min  .  Stress: Very much  Relationships  . Social connections:    Talks on phone: More than three times a week    Gets together: More than three times a week    Attends religious service: Never    Active member of club or organization: No    Attends meetings of clubs or organizations: Never    Relationship status: Married  Other Topics Concern  . Not on file  Social History Narrative  . Not on file    Tobacco Use No.  Clinical Intake:     Pain : No/denies pain     Nutritional Status: BMI of 19-24  Normal Diabetes: No  How often do you need to have someone help you when you read instructions, pamphlets, or other written materials from your doctor or pharmacy?: 3 - Sometimes What is the last grade level you completed in school?: 10th  Interpreter Needed?: No  Information entered by :: Chong Sicilian, RN  Activities of Daily Living In your present state of health, do you have any difficulty performing the following activities: 03/08/2018  Hearing? N  Vision? N  Difficulty concentrating or making decisions? N  Walking or climbing stairs? N  Dressing or bathing? N  Doing errands, shopping? N  Preparing Food and eating ? N  Using the Toilet? N  In the past six months, have you accidently leaked urine? N  Do you have problems with loss of bowel control? N  Managing your Medications? N  Managing your Finances? N  Housekeeping or managing your Housekeeping? N  Some recent data might be hidden     Diet 3 meals a day   Exercise Current Exercise Habits: Home exercise routine, Type of exercise: strength training/weights;walking, Time (Minutes): 60, Frequency (Times/Week): 5, Weekly Exercise (Minutes/Week): 300, Intensity: Moderate, Exercise limited by: None identified    Depression Screen PHQ 2/9 Scores 03/08/2018 02/21/2018 02/09/2018 10/27/2017  PHQ - 2 Score 0 2 0 0  PHQ- 9 Score - 6 - -     Fall Risk Fall Risk  03/08/2018 02/21/2018 10/27/2017 08/09/2017 07/15/2017  Falls in the past year? No No No No No    Safety Is the patient's home free of loose throw rugs in walkways, pet beds, electrical cords, etc?   yes      Grab bars in the bathroom? no      Walkin shower? no      Shower Seat? no      Handrails on the stairs?   yes      Adequate lighting?   yes  Patient Care Team: Chevis Pretty, FNP as PCP - General (Family Medicine)  Hospitalizations, surgeries, and ER visits in previous 12 months No hospitalizations, ER visits, or surgeries this past year.  Objective:    Today's Vitals   03/08/18 1045  BP: 117/68  Pulse: 82  Weight: 208 lb (94.3 kg)  Height: 5' 8.5" (1.74 m)   Body mass index is 31.17 kg/m.  Advanced Directives 03/08/2018  Does Patient Have a Medical Advance Directive? No  Would patient like information on creating a medical advance directive?  Yes (MAU/Ambulatory/Procedural Areas - Information given)    Hearing/Vision  No hearing or vision deficits noted during visit.   Cognitive Function: MMSE - Mini Mental State Exam 03/08/2018  Not completed: Unable to complete  (unable to complete due to time constraints and the length of time spent on other areas of the visit. Patient does not have a personal or family history of  memory loss/dementia/alzheimer's. No apparent abnormality during visit.)   Immunizations and Health Maintenance  There is no immunization history on file for this patient. Health Maintenance Due  Topic Date Due  . INFLUENZA VACCINE  02/03/2018   Health Maintenance  Topic Date Due  . INFLUENZA VACCINE  02/03/2018  . TETANUS/TDAP  03/06/2021  . HIV Screening  Completed        Assessment:   This is a routine wellness examination for Abdur.      Plan:    Goals    . Exercise 150 min/wk Moderate Activity        Health Maintenance Recommendations: Influenza vaccine  Additional Screening Recommendations: Lung: Low Dose CT Chest recommended if Age 80-80 years, 30 pack-year currently smoking OR have quit w/in 15years. Patient does not qualify. Hepatitis C Screening recommended: no  Keep f/u with Hassell Done, Mary-Margaret, FNP and any other specialty appointments you may have Continue current medications Aim for at least 150 minutes of moderate activity a week. Exercise helps with anxiety and depression. Recommended individual and couples counseling.  Stay connected with friends and family. Social connections are beneficial to your emotional and mental health.   I have personally reviewed and noted the following in the patient's chart:   . Medical and social history . Use of alcohol, tobacco or illicit drugs  . Current medications and supplements . Functional ability and status . Nutritional status . Physical activity . Advanced directives . List of other physicians . Hospitalizations,  surgeries, and ER visits in previous 12 months . Vitals . Screenings to include cognitive, depression, and falls . Referrals and appointments  In addition, I have reviewed and discussed with patient certain preventive protocols, quality metrics, and best practice recommendations. A written personalized care plan for preventive services as well as general preventive health recommendations were provided to patient.     Chong Sicilian, RN   03/08/2018  I have reviewed and agree with the above AWV documentation.   Mary-Margaret Hassell Done, FNP

## 2018-05-24 ENCOUNTER — Ambulatory Visit (INDEPENDENT_AMBULATORY_CARE_PROVIDER_SITE_OTHER): Payer: Medicare Other | Admitting: Nurse Practitioner

## 2018-05-24 ENCOUNTER — Encounter: Payer: Self-pay | Admitting: Nurse Practitioner

## 2018-05-24 VITALS — BP 121/80 | HR 69 | Temp 97.7°F | Ht 68.0 in | Wt 213.0 lb

## 2018-05-24 DIAGNOSIS — K582 Mixed irritable bowel syndrome: Secondary | ICD-10-CM | POA: Diagnosis not present

## 2018-05-24 DIAGNOSIS — F3342 Major depressive disorder, recurrent, in full remission: Secondary | ICD-10-CM | POA: Diagnosis not present

## 2018-05-24 DIAGNOSIS — F410 Panic disorder [episodic paroxysmal anxiety] without agoraphobia: Secondary | ICD-10-CM | POA: Diagnosis not present

## 2018-05-24 MED ORDER — CITALOPRAM HYDROBROMIDE 40 MG PO TABS: 40.0000 mg | ORAL_TABLET | Freq: Every day | ORAL | 1 refills | Status: DC

## 2018-05-24 MED ORDER — BUPROPION HCL ER (XL) 150 MG PO TB24: 150.0000 mg | ORAL_TABLET | Freq: Every day | ORAL | 3 refills | Status: DC

## 2018-05-24 MED ORDER — DIAZEPAM 5 MG PO TABS: 5.0000 mg | ORAL_TABLET | Freq: Two times a day (BID) | ORAL | 2 refills | Status: DC | PRN

## 2018-05-24 NOTE — Patient Instructions (Signed)

## 2018-05-24 NOTE — Progress Notes (Signed)
Subjective:    Patient ID: Tyler Barton, male    DOB: [birth year], 47 y.o.   MRN: [id number]   Chief Complaint: medical management of chronic issues  HPI:  1. Irritable bowel syndrome with both constipation and diarrhea  Has occasional diarrhea.  2. Panic attacks  Has not had any panic attacks to speak of.   3. Recurrent major depressive disorder, in full remission [facility])  He is currently on celexa and says he is not doing well. Says he worries all the time. Has no desire to go out of house  and do anything. Has no desire for sex. We have triede many ssri's in th epast and had side effects. Depression screen [facility] 2/9 05/24/2018 03/08/2018 02/21/2018  Decreased Interest 2 0 1  Down, Depressed, Hopeless 1 0 1  PHQ - 2 Score 3 0 2  Altered sleeping 2 - 1  Tired, decreased energy 2 - 1  Change in appetite 3 - 1  Feeling bad or failure about yourself  0 - 0  Trouble concentrating 2 - 1  Moving slowly or fidgety/restless 2 - 0  Suicidal thoughts 0 - 0  PHQ-9 Score 14 - 6  Some recent data might be hidden     4. Panic disorder  Is on valium BID- says he worries all the time.    Outpatient Encounter Medications as of 05/24/2018  Medication Sig  . citalopram (CELEXA) 40 MG tablet Take 1 tablet (40 mg total) by mouth daily.  . diazepam (VALIUM) 5 MG tablet Take 1 tablet (5 mg total) by mouth every 12 (twelve) hours as needed. for anxiety  . loratadine (CLARITIN) 10 MG tablet Take 1 tablet (10 mg total) by mouth daily. Reported on 07/10/2015   No facility-administered encounter medications on file as of 05/24/2018.       New complaints: None other then stated above  Social history: Is on disability for anxiety  Review of Systems  Constitutional: Negative for activity change and appetite change.  HENT: Negative.   Eyes: Negative for pain.  Respiratory: Negative for shortness of breath.   Cardiovascular: Negative for chest pain, palpitations and leg swelling.  Gastrointestinal:  Negative for abdominal pain.  Endocrine: Negative for polydipsia.  Genitourinary: Negative.   Skin: Negative for rash.  Neurological: Negative for dizziness, weakness and headaches.  Hematological: Does not bruise/bleed easily.  Psychiatric/Behavioral: Negative.   All other systems reviewed and are negative.      Objective:   Physical Exam  Constitutional: He is oriented to person, place, and time. He appears well-developed and well-nourished.  Cardiovascular: Normal rate and regular rhythm.  Pulmonary/Chest: Effort normal.  Neurological: He is alert and oriented to person, place, and time.  Skin: Skin is warm.  Psychiatric: He has a normal mood and affect. His behavior is normal. Judgment and thought content normal.  Good eye contact Answers all questions appropriately    BP 121/80   Pulse 69   Temp 97.7 F (36.5 C) (Oral)   Ht 5\' 8"  (1.727 m)   Wt 213 lb (96.6 kg)   BMI 32.39 kg/m        Assessment & Plan:  Tyler Barton comes in today with chief complaint of Medical Management of Chronic Issues   Diagnosis and orders addressed:  1. Irritable bowel syndrome with both constipation and diarrhea Watch diet  2. Panic attacks - diazepam (VALIUM) 5 MG tablet; Take 1 tablet (5 mg total) by mouth every 12 (twelve) hours  as needed. for anxiety  Dispense: 60 tablet; Refill: 2  3. Recurrent major depressive disorder, in full remission ([place]) - citalopram (CELEXA) 40 MG tablet; Take 1 tablet (40 mg total) by mouth daily.  Dispense: 90 tablet; Refill: 1 - buPROPion (WELLBUTRIN XL) 150 MG 24 hr tablet; Take 1 tablet (150 mg total) by mouth daily.  Dispense: 30 tablet; Refill: 3  4. Panic disorder Stress management Added wellbutrin 150mg  dialy Need to get out of house and find something enjoy doing   Labs pending Health Maintenance reviewed Diet and exercise encouraged  Follow up plan: 1 month   [place], FNP

## 2018-06-21 ENCOUNTER — Encounter: Payer: Self-pay | Admitting: Nurse Practitioner

## 2018-06-21 ENCOUNTER — Ambulatory Visit (INDEPENDENT_AMBULATORY_CARE_PROVIDER_SITE_OTHER): Payer: Medicare Other | Admitting: Nurse Practitioner

## 2018-06-21 VITALS — BP 131/85 | HR 69 | Temp 97.2°F | Ht 68.0 in | Wt 217.0 lb

## 2018-06-21 DIAGNOSIS — F3342 Major depressive disorder, recurrent, in full remission: Secondary | ICD-10-CM

## 2018-06-21 NOTE — Patient Instructions (Signed)
Persistent Depressive Disorder Persistent depressive disorder (PDD) is a mental health condition. PDD causes symptoms of low-level depression for 2 years or longer. It may also be called long-term (chronic) depression or dysthymia. PDD may include episodes of more severe depression that last for about 2 weeks (major depressive disorder or MDD). PDD can affect the way you think, feel, and sleep. This condition may also affect your relationships. You may be more likely to get sick if you have PDD. Symptoms of PDD occur for most of the day and may include:  Feeling tired (fatigue).  Low energy.  Eating too much or too little.  Sleeping too much or too little.  Feeling restless or agitated.  Feeling hopeless.  Feeling worthless or guilty.  Feeling worried or nervous (anxiety).  Trouble concentrating or making decisions.  Low self-esteem.  A negative way of looking at things ([place]).  Not being able to have fun or feel pleasure.  Avoiding interacting with people.  Getting angry or annoyed easily (irritability).  Acting aggressive or angry.  Follow these instructions at home: Activity  Go back to your normal activities as told by your doctor.  Exercise regularly as told by your doctor. General instructions  Take over-the-counter and prescription medicines only as told by your doctor.  Do not drink alcohol. Or, limit how much alcohol you drink to no more than 1 drink a day for nonpregnant women and 2 drinks a day for men. One drink equals 12 oz of beer, 5 oz of wine, or 1 oz of hard liquor. Alcohol can affect any antidepressant medicines you are taking. Talk with your doctor about your alcohol use.  Eat a healthy diet and get plenty of sleep.  Find activities that you enjoy each day.  Consider joining a support group. Your doctor may be able to suggest a support group.  Keep all follow-up visits as told by your doctor. This is important. Where to find more  information: National Alliance on Mental Illness  www.nami.org  U.S. National Institute of Mental Health  www.nimh.nih.gov  National Suicide Prevention Lifeline  1-800-273-TALK (1-800-273-8255). This is free, 24-hour help.  Contact a doctor if:  Your symptoms get worse.  You have new symptoms.  You have trouble sleeping or doing your daily activities. Get help right away if:  You self-harm.  You have serious thoughts about hurting yourself or others.  You see, hear, taste, smell, or feel things that are not there (hallucinate). This information is not intended to replace advice given to you by your health care provider. Make sure you discuss any questions you have with your health care provider. Document Released: 06/03/2015 Document Revised: 02/14/2016 Document Reviewed: 02/14/2016 Elsevier Interactive Patient Education  2017 Elsevier Inc.  

## 2018-06-21 NOTE — Progress Notes (Signed)
   Subjective:    Patient ID: Tyler Barton, male    DOB: 14-Mar-1971, 47 y.o.   MRN: [id number]   Chief Complaint: 1 month recheck depression   HPI\ Patient was seen on 05/1918 with depression and panic attacks. He was on celexa and we added wellbutrin.he says that has seemed to help him. He is in much better mood. His only complaint is no sex drive.  He just is not interested. He says that things that have happened in the past has caused him to not be interested in sex at all.  Depression screen [facility] 2/9 06/21/2018 05/24/2018 03/08/2018  Decreased Interest 0 2 0  Down, Depressed, Hopeless 1 1 0  PHQ - 2 Score 1 3 0  Altered sleeping - 2 -  Tired, decreased energy - 2 -  Change in appetite - 3 -  Feeling bad or failure about yourself  - 0 -  Trouble concentrating - 2 -  Moving slowly or fidgety/restless - 2 -  Suicidal thoughts - 0 -  PHQ-9 Score - 14 -  Some recent data might be hidden    Review of Systems  Constitutional: Negative.  Negative for activity change and appetite change.  HENT: Negative.   Eyes: Negative for pain.  Respiratory: Negative for shortness of breath.   Cardiovascular: Negative for chest pain, palpitations and leg swelling.  Gastrointestinal: Negative for abdominal pain.  Endocrine: Negative for polydipsia.  Genitourinary: Negative.   Skin: Negative for rash.  Neurological: Negative for dizziness, weakness and headaches.  Hematological: Does not bruise/bleed easily.  Psychiatric/Behavioral: Negative.   All other systems reviewed and are negative.      Objective:   Physical Exam Constitutional:      Appearance: Normal appearance. He is normal weight.  Cardiovascular:     Rate and Rhythm: Normal rate and regular rhythm.  Pulmonary:     Effort: Pulmonary effort is normal.     Breath sounds: Normal breath sounds.  Skin:    General: Skin is warm and dry.  Neurological:     General: No focal deficit present.     Mental Status: He is alert.    Psychiatric:        Mood and Affect: Mood normal.        Behavior: Behavior normal.    BP 131/85   Pulse 69   Temp (!) 97.2 F (36.2 C) (Oral)   Ht 5\' 8"  (1.727 m)   Wt 217 lb (98.4 kg)   BMI 32.99 kg/m      Assessment & Plan:  [name] in today with chief complaint of 1 month recheck depression   1. Recurrent major depressive disorder, in full remission [facility]) Stress management Needs to see counseling but does not want to because did not help in the past Continue celexa and wellbutrin as rx RTO in 3 months  [place], FNP

## 2018-09-20 ENCOUNTER — Encounter: Payer: Self-pay | Admitting: Nurse Practitioner

## 2018-09-20 ENCOUNTER — Other Ambulatory Visit: Payer: Self-pay

## 2018-09-20 ENCOUNTER — Ambulatory Visit (INDEPENDENT_AMBULATORY_CARE_PROVIDER_SITE_OTHER): Payer: Medicare Other | Admitting: Nurse Practitioner

## 2018-09-20 VITALS — BP 127/82 | HR 75 | Temp 97.7°F | Ht 68.0 in | Wt 219.0 lb

## 2018-09-20 DIAGNOSIS — F3342 Major depressive disorder, recurrent, in full remission: Secondary | ICD-10-CM | POA: Diagnosis not present

## 2018-09-20 DIAGNOSIS — F410 Panic disorder [episodic paroxysmal anxiety] without agoraphobia: Secondary | ICD-10-CM | POA: Diagnosis not present

## 2018-09-20 DIAGNOSIS — K582 Mixed irritable bowel syndrome: Secondary | ICD-10-CM

## 2018-09-20 MED ORDER — CITALOPRAM HYDROBROMIDE 40 MG PO TABS: 40.0000 mg | ORAL_TABLET | Freq: Every day | ORAL | 1 refills | Status: DC

## 2018-09-20 MED ORDER — DIAZEPAM 5 MG PO TABS: 5.0000 mg | ORAL_TABLET | Freq: Two times a day (BID) | ORAL | 2 refills | Status: DC | PRN

## 2018-09-20 MED ORDER — BUPROPION HCL ER (XL) 150 MG PO TB24: 150.0000 mg | ORAL_TABLET | Freq: Every day | ORAL | 1 refills | Status: DC

## 2018-09-20 NOTE — Patient Instructions (Signed)

## 2018-09-20 NOTE — Progress Notes (Signed)
Subjective:    Patient ID: Tyler Barton, male    DOB: 11-21-1970, 48 y.o.   MRN: [id number]   Chief Complaint: Medical Management of Chronic Issues   HPI:  1. Irritable bowel syndrome with both constipation and diarrhea  currently doing well. No diarrhea or constipation right now.  2. Panic disorder  Is on valium to keep him calm. Is working well for him. He is just a very nervous person.   3. Recurrent major depressive disorder, in full remission ([place])  He was on celexa and we added wellbutrin. He has weaned off of the celexa due to sexual side effects and is doing well just on wellbutrin. Depression screen [facility] 2/9 09/20/2018 06/21/2018 05/24/2018  Decreased Interest 0 0 2  Down, Depressed, Hopeless 0 1 1  PHQ - 2 Score 0 1 3  Altered sleeping - - 2  Tired, decreased energy - - 2  Change in appetite - - 3  Feeling bad or failure about yourself  - - 0  Trouble concentrating - - 2  Moving slowly or fidgety/restless - - 2  Suicidal thoughts - - 0  PHQ-9 Score - - 14  Some recent data might be hidden       Outpatient Encounter Medications as of 09/20/2018  Medication Sig  . buPROPion (WELLBUTRIN XL) 150 MG 24 hr tablet Take 1 tablet (150 mg total) by mouth daily.  . diazepam (VALIUM) 5 MG tablet Take 1 tablet (5 mg total) by mouth every 12 (twelve) hours as needed. for anxiety  . loratadine (CLARITIN) 10 MG tablet Take 1 tablet (10 mg total) by mouth daily. Reported on 07/10/2015  . citalopram (CELEXA) 40 MG tablet Take 1 tablet (40 mg total) by mouth daily. (Patient not taking: Reported on 09/20/2018)      New complaints: None today  Social history: ison disability and stays home a lot. Marriage is doing better.   Review of Systems  Constitutional: Negative for activity change and appetite change.  HENT: Negative.   Eyes: Negative for pain.  Respiratory: Negative for shortness of breath.   Cardiovascular: Negative for chest pain, palpitations and leg swelling.   Gastrointestinal: Negative for abdominal pain.  Endocrine: Negative for polydipsia.  Genitourinary: Negative.   Skin: Negative for rash.  Neurological: Negative for dizziness, weakness and headaches.  Hematological: Does not bruise/bleed easily.  Psychiatric/Behavioral: Negative.   All other systems reviewed and are negative.      Objective:   Physical Exam Vitals signs and nursing note reviewed.  Constitutional:      Appearance: Normal appearance. He is normal weight.  Cardiovascular:     Rate and Rhythm: Normal rate and regular rhythm.     Heart sounds: Normal heart sounds.  Pulmonary:     Effort: Pulmonary effort is normal.     Breath sounds: Normal breath sounds.  Abdominal:     General: Abdomen is flat.  Skin:    General: Skin is warm.  Neurological:     General: No focal deficit present.     Mental Status: He is alert and oriented to person, place, and time.  Psychiatric:        Mood and Affect: Mood normal.        Behavior: Behavior normal.    BP 127/82   Pulse 75   Temp 97.7 F (36.5 C) (Oral)   Ht 5\' 8"  (1.727 m)   Wt 219 lb (99.3 kg)   BMI 33.30 kg/m  Assessment & Plan:  ELIUS ETHEREDGE in today with chief complaint of Medical Management of Chronic Issues   1. Irritable bowel syndrome with both constipation and diarrhea Watch diet  2. Panic disorder Stress management  3. Recurrent major depressive disorder, in full remission ([place]) - citalopram (CELEXA) 40 MG tablet; Take 1 tablet (40 mg total) by mouth daily.  Dispense: 90 tablet; Refill: 1 - buPROPion (WELLBUTRIN XL) 150 MG 24 hr tablet; Take 1 tablet (150 mg total) by mouth daily.  Dispense: 90 tablet; Refill: 1  4. Panic attacks - diazepam (VALIUM) 5 MG tablet; Take 1 tablet (5 mg total) by mouth every 12 (twelve) hours as needed. for anxiety  Dispense: 60 tablet; Refill: 2   Mary-Margaret Hassell Done, FNP

## 2018-09-29 ENCOUNTER — Other Ambulatory Visit: Payer: Self-pay

## 2018-09-29 ENCOUNTER — Encounter: Payer: Self-pay | Admitting: Nurse Practitioner

## 2018-09-29 ENCOUNTER — Ambulatory Visit (INDEPENDENT_AMBULATORY_CARE_PROVIDER_SITE_OTHER): Payer: Medicare Other | Admitting: Nurse Practitioner

## 2018-09-29 DIAGNOSIS — J0101 Acute recurrent maxillary sinusitis: Secondary | ICD-10-CM | POA: Diagnosis not present

## 2018-09-29 MED ORDER — AMOXICILLIN-POT CLAVULANATE 875-125 MG PO TABS: 1.0000 | ORAL_TABLET | Freq: Two times a day (BID) | ORAL | 0 refills | Status: DC

## 2018-09-29 MED ORDER — PREDNISONE 20 MG PO TABS: ORAL_TABLET | ORAL | 0 refills | Status: DC

## 2018-09-29 NOTE — Progress Notes (Signed)
Patient ID: Tyler Barton, male   DOB: 1970/07/31, 48 y.o.   MRN: [id number]    Virtual Visit via telephone Note  I connected with [name] on 09/29/18 at 9:40 AM by telephone and verified that I am speaking with the correct person using two identifiers. [name] is currently located at home and no one is currently with her during visit. The provider, Mary-Margaret Hassell Done, FNP is located in their office at time of visit.  I discussed the limitations, risks, security and privacy concerns of performing an evaluation and management service by telephone and the availability of in person appointments. I also discussed with the patient that there may be a patient responsible charge related to this service. The patient expressed understanding and agreed to proceed.   History and Present Illness:   Chief Complaint: sinus   HPI Patient calls in today c/o sinus pressure, facial pain and productive cough. Started 3 days ago and he had a low grade fever last night. He is on allergy meds daily.    Review of Systems  Constitutional: Positive for fever.  HENT: Positive for congestion, ear pain and sinus pain.   Eyes: Negative.   Respiratory: Positive for cough (productive- yellow).   Cardiovascular: Negative for chest pain, palpitations and orthopnea.  Gastrointestinal: Negative.   Genitourinary: Negative.   Musculoskeletal: Negative.   Neurological: Negative.   Psychiatric/Behavioral: Negative.   All other systems reviewed and are negative.    Observations/Objective: Alert and oriented- answers all questions appropriately Voice hoarse Slight we cough during converstion  Assessment and Plan: [name] comes in today with chief complaint of No chief complaint on file.   Diagnosis and orders addressed:  1. Acute recurrent maxillary sinusitis 1. Take meds as prescribed 2. Use a cool mist humidifier especially during the winter months and when heat has been humid. 3. Use  saline nose sprays frequently 4. Saline irrigations of the nose can be very helpful if done frequently.  * 4X daily for 1 week*  * Use of a nettie pot can be helpful with this. Follow directions with this* 5. Drink plenty of fluids 6. Keep thermostat turn down low 7.For any cough or congestion  Use plain Mucinex- regular strength or max strength is fine   * Children- consult with Pharmacist for dosing 8. For fever or aces or pains- take tylenol or ibuprofen appropriate for age and weight.  * for fevers greater than 101 orally you may alternate ibuprofen and tylenol every  3 hours.    - amoxicillin-clavulanate (AUGMENTIN) 875-125 MG tablet; Take 1 tablet by mouth 2 (two) times daily.  Dispense: 14 tablet; Refill: 0 - predniSONE (DELTASONE) 20 MG tablet; 2 po at sametime daily for 5 days  Dispense: 10 tablet; Refill: 0    Follow Up Instructions: prn    I discussed the assessment and treatment plan with the patient. The patient was provided an opportunity to ask questions and all were answered. The patient agreed with the plan and demonstrated an understanding of the instructions.   The patient was advised to call back or seek an in-person evaluation if the symptoms worsen or if the condition fails to improve as anticipated.  The above assessment and management plan was discussed with the patient. The patient verbalized understanding of and has agreed to the management plan. Patient is aware to call the clinic if symptoms persist or worsen. Patient is aware when to return to the clinic for a follow-up visit.  Patient educated on when it is appropriate to go to the emergency department.    I provided 8 minutes of non-face-to-face time during this encounter.    Mary-Margaret Hassell Done, FNP

## 2018-09-29 NOTE — Patient Instructions (Signed)

## 2018-11-21 ENCOUNTER — Telehealth: Payer: Self-pay | Admitting: Nurse Practitioner

## 2018-12-16 ENCOUNTER — Ambulatory Visit (INDEPENDENT_AMBULATORY_CARE_PROVIDER_SITE_OTHER): Payer: Medicare Other | Admitting: Nurse Practitioner

## 2018-12-16 ENCOUNTER — Encounter: Payer: Self-pay | Admitting: Nurse Practitioner

## 2018-12-16 ENCOUNTER — Other Ambulatory Visit: Payer: Self-pay

## 2018-12-16 DIAGNOSIS — K582 Mixed irritable bowel syndrome: Secondary | ICD-10-CM | POA: Diagnosis not present

## 2018-12-16 DIAGNOSIS — F3342 Major depressive disorder, recurrent, in full remission: Secondary | ICD-10-CM

## 2018-12-16 DIAGNOSIS — F410 Panic disorder [episodic paroxysmal anxiety] without agoraphobia: Secondary | ICD-10-CM

## 2018-12-16 MED ORDER — DIAZEPAM 5 MG PO TABS: 5.0000 mg | ORAL_TABLET | Freq: Two times a day (BID) | ORAL | 2 refills | Status: DC | PRN

## 2018-12-16 NOTE — Progress Notes (Signed)
Virtual Visit via telephone Note  I connected with [name] on 12/16/18 at 2:45 PM by telephone and verified that I am speaking with the correct person using two identifiers. KHAMBREL AMSDEN is currently located at home and no one is currently with her during visit. The provider, Mary-Margaret Hassell Done, FNP is located in their office at time of visit.  I discussed the limitations, risks, security and privacy concerns of performing an evaluation and management service by telephone and the availability of in person appointments. I also discussed with the patient that there may be a patient responsible charge related to this service. The patient expressed understanding and agreed to proceed.   History and Present Illness:   Chief Complaint: Medical Management of Chronic Issues    HPI:  1. Recurrent major depressive disorder, in full remission [facility]) Patient is currently on celexa and wellbutrin. The combination is working ok for him. His main issue is marital,but says is getting better  2. Panic disorder Has several panic attacks in the last 2 months. His son is not working and worries him to death. Just gets aggitated so easily. Has to take his valium daily.  3. Irritable bowel syndrome with both constipation and diarrhea No recent flareups.    Outpatient Encounter Medications as of 12/16/2018  Medication Sig  . buPROPion (WELLBUTRIN XL) 150 MG 24 hr tablet Take 1 tablet (150 mg total) by mouth daily.  . citalopram (CELEXA) 40 MG tablet Take 1 tablet (40 mg total) by mouth daily.  . diazepam (VALIUM) 5 MG tablet Take 1 tablet (5 mg total) by mouth every 12 (twelve) hours as needed. for anxiety  . loratadine (CLARITIN) 10 MG tablet Take 1 tablet (10 mg total) by mouth daily. Reported on 07/10/2015    Past Surgical History:  Procedure Laterality Date  . KNEE ARTHROSCOPY Left 1993  . LIPOMA EXCISION      Family History  Problem Relation Age of Onset  . Anxiety disorder Mother    . Anxiety disorder Father   . Heart disease Father 78       with stent placement and bypass  . Anxiety disorder Sister   . Cancer Maternal Uncle        throat  . Alzheimer's disease Maternal Grandmother   . Cancer Paternal Grandmother   . Heart disease Maternal Uncle   . Heart disease Maternal Uncle     New complaints: Nothing new today  Social history: Lives with wife- he is on disability for anxiety     Review of Systems  Constitutional: Negative for diaphoresis and weight loss.  Eyes: Negative for blurred vision, double vision and pain.  Respiratory: Negative for shortness of breath.   Cardiovascular: Negative for chest pain, palpitations, orthopnea and leg swelling.  Gastrointestinal: Negative for abdominal pain.  Skin: Negative for rash.  Neurological: Negative for dizziness, sensory change, loss of consciousness, weakness and headaches.  Endo/Heme/Allergies: Negative for polydipsia. Does not bruise/bleed easily.  Psychiatric/Behavioral: Positive for depression. Negative for memory loss. The patient is nervous/anxious. The patient does not have insomnia.   All other systems reviewed and are negative.    Observations/Objective: Alert and oriented- answers all questions appropriately No distress  Assessment and Plan: MAKANI SECKMAN comes in today with chief complaint of Medical Management of Chronic Issues   Diagnosis and orders addressed:  1. Recurrent major depressive disorder, in full remission ([place]) Continue stress management  2. Panic disorder Continue current meds  3. Irritable bowel  syndrome with both constipation and diarrhea Watch diet  4. Panic attacks - diazepam (VALIUM) 5 MG tablet; Take 1 tablet (5 mg total) by mouth every 12 (twelve) hours as needed. for anxiety  Dispense: 60 tablet; Refill: 2  Will schedule complete physical Previous labs reviewed Health Maintenance reviewed Diet and exercise encouraged  Follow up plan: 3 months      I discussed the assessment and treatment plan with the patient. The patient was provided an opportunity to ask questions and all were answered. The patient agreed with the plan and demonstrated an understanding of the instructions.   The patient was advised to call back or seek an in-person evaluation if the symptoms worsen or if the condition fails to improve as anticipated.  The above assessment and management plan was discussed with the patient. The patient verbalized understanding of and has agreed to the management plan. Patient is aware to call the clinic if symptoms persist or worsen. Patient is aware when to return to the clinic for a follow-up visit. Patient educated on when it is appropriate to go to the emergency department.   Time call ended:  3:00 PM  I provided 15 minutes of non-face-to-face time during this encounter.    Mary-Margaret Hassell Done, FNP

## 2019-03-31 ENCOUNTER — Encounter: Payer: Self-pay | Admitting: Nurse Practitioner

## 2019-03-31 ENCOUNTER — Other Ambulatory Visit: Payer: Self-pay

## 2019-03-31 ENCOUNTER — Ambulatory Visit (INDEPENDENT_AMBULATORY_CARE_PROVIDER_SITE_OTHER): Payer: Medicare Other | Admitting: Nurse Practitioner

## 2019-03-31 VITALS — BP 131/88 | HR 78 | Temp 98.2°F | Resp 20 | Ht 68.0 in | Wt 221.0 lb

## 2019-03-31 DIAGNOSIS — F410 Panic disorder [episodic paroxysmal anxiety] without agoraphobia: Secondary | ICD-10-CM | POA: Diagnosis not present

## 2019-03-31 DIAGNOSIS — K582 Mixed irritable bowel syndrome: Secondary | ICD-10-CM

## 2019-03-31 DIAGNOSIS — Z6833 Body mass index (BMI) 33.0-33.9, adult: Secondary | ICD-10-CM

## 2019-03-31 DIAGNOSIS — F3342 Major depressive disorder, recurrent, in full remission: Secondary | ICD-10-CM | POA: Diagnosis not present

## 2019-03-31 MED ORDER — BUPROPION HCL ER (XL) 150 MG PO TB24: 150.0000 mg | ORAL_TABLET | Freq: Every day | ORAL | 1 refills | Status: DC

## 2019-03-31 MED ORDER — DIAZEPAM 5 MG PO TABS: 5.0000 mg | ORAL_TABLET | Freq: Two times a day (BID) | ORAL | 2 refills | Status: DC | PRN

## 2019-03-31 NOTE — Patient Instructions (Signed)

## 2019-03-31 NOTE — Progress Notes (Signed)
Subjective:    Patient ID: Tyler Barton, male    DOB: May 25, 1971, 48 y.o.   MRN: [id number]  Chief Complaint: medical management of chronic issues  HPI:  1. Panic disorder Patient has been having panic attacks for several years. He has been doing well as of late. He is on valium BID.  GAD 7 : Generalized Anxiety Score 03/31/2019 02/20/2016 12/12/2015  Nervous, Anxious, on Edge 2 1 2   Control/stop worrying 2 3 2   Worry too much - different things 2 2 2   Trouble relaxing 0 2 1  Restless 0 2 1  Easily annoyed or irritable 0 3 2  Afraid - awful might happen 0 0 1  Total GAD 7 Score 6 13 11   Anxiety Difficulty - Very difficult Somewhat difficult  Some encounter information is confidential and restricted. Go to Review Flowsheets activity to see all data.      2. Recurrent major depressive disorder, in full remission [facility]) He has been depressed for several years and is now on disabilty for his depression. He is currently on wellbutrin and seems to be working well. He stopped his celexa because it seemed to be pulling him down.  Depression screen [facility] 2/9 03/31/2019 12/16/2018 09/20/2018  Decreased Interest 0 1 0  Down, Depressed, Hopeless 0 1 0  PHQ - 2 Score 0 2 0  Altered sleeping - 1 -  Tired, decreased energy - 0 -  Change in appetite - 0 -  Feeling bad or failure about yourself  - 0 -  Trouble concentrating - 1 -  Moving slowly or fidgety/restless - 0 -  Suicidal thoughts - 0 -  PHQ-9 Score - 4 -  Difficult doing work/chores - Somewhat difficult -  Some recent data might be hidden     3. Irritable bowel syndrome with both constipation and diarrhea Has had no recent issues. Bowel movements have been normal  4. BMI 33.0-33.9 BMI Readings from Last 3 Encounters:  03/31/19 33.60 kg/m  09/20/18 33.30 kg/m  06/21/18 32.99 kg/m   Wt Readings from Last 3 Encounters:  03/31/19 221 lb (100.2 kg)  09/20/18 219 lb (99.3 kg)  06/21/18 217 lb (98.4 kg)     Outpatient  Encounter Medications as of 03/31/2019  Medication Sig  . buPROPion (WELLBUTRIN XL) 150 MG 24 hr tablet Take 1 tablet (150 mg total) by mouth daily.  . citalopram (CELEXA) 40 MG tablet Take 1 tablet (40 mg total) by mouth daily.  . diazepam (VALIUM) 5 MG tablet Take 1 tablet (5 mg total) by mouth every 12 (twelve) hours as needed. for anxiety  . loratadine (CLARITIN) 10 MG tablet Take 1 tablet (10 mg total) by mouth daily. Reported on 07/10/2015     Past Surgical History:  Procedure Laterality Date  . KNEE ARTHROSCOPY Left 1993  . LIPOMA EXCISION      Family History  Problem Relation Age of Onset  . Anxiety disorder Mother   . Anxiety disorder Father   . Heart disease Father 80       with stent placement and bypass  . Anxiety disorder Sister   . Cancer Maternal Uncle        throat  . Alzheimer's disease Maternal Grandmother   . Cancer Paternal Grandmother   . Heart disease Maternal Uncle   . Heart disease Maternal Uncle     New complaints: None today  Social history: Lives with wife- is on disability nad does not leave his house much  Controlled substance contract: 03/31/19    Review of Systems  Constitutional: Negative for activity change and appetite change.  HENT: Negative.   Eyes: Negative for pain.  Respiratory: Negative for shortness of breath.   Cardiovascular: Negative for chest pain, palpitations and leg swelling.  Gastrointestinal: Negative for abdominal pain.  Endocrine: Negative for polydipsia.  Genitourinary: Negative.   Skin: Negative for rash.  Neurological: Negative for dizziness, weakness and headaches.  Hematological: Does not bruise/bleed easily.  Psychiatric/Behavioral: Negative.   All other systems reviewed and are negative.      Objective:   Physical Exam Vitals signs and nursing note reviewed.  Constitutional:      Appearance: Normal appearance. He is well-developed.  HENT:     Head: Normocephalic.     Nose: Nose normal.  Eyes:      Pupils: Pupils are equal, round, and reactive to light.  Neck:     Musculoskeletal: Normal range of motion and neck supple.     Thyroid: No thyroid mass or thyromegaly.     Vascular: No carotid bruit or JVD.     Trachea: Phonation normal.  Cardiovascular:     Rate and Rhythm: Normal rate and regular rhythm.  Pulmonary:     Effort: Pulmonary effort is normal. No respiratory distress.     Breath sounds: Normal breath sounds.  Abdominal:     General: Bowel sounds are normal.     Palpations: Abdomen is soft.     Tenderness: There is no abdominal tenderness.  Musculoskeletal: Normal range of motion.  Lymphadenopathy:     Cervical: No cervical adenopathy.  Skin:    General: Skin is warm and dry.  Neurological:     Mental Status: He is alert and oriented to person, place, and time.  Psychiatric:        Behavior: Behavior normal.        Thought Content: Thought content normal.        Judgment: Judgment normal.    BP 131/88   Pulse 78   Temp 98.2 F (36.8 C) (Oral)   Resp 20   Ht 5\' 8"  (1.727 m)   Wt 221 lb (100.2 kg)   SpO2 97%   BMI 33.60 kg/m        Assessment & Plan:  CHICO TARANTO comes in today with chief complaint of Medical Management of Chronic Issues   Diagnosis and orders addressed:  1. Panic disorder continue stress management - diazepam (VALIUM) 5 MG tablet; Take 1 tablet (5 mg total) by mouth every 12 (twelve) hours as needed. for anxiety  Dispense: 60 tablet; Refill: 2  2. Recurrent major depressive disorder, in full remission ([place]) - buPROPion (WELLBUTRIN XL) 150 MG 24 hr tablet; Take 1 tablet (150 mg total) by mouth daily.  Dispense: 90 tablet; Refill: 1  3. Irritable bowel syndrome with both constipation and diarrhea Watch diet that causes flare ups  4. BMI 33.0-33.9,adult *Discussed diet and exercise for person with BMI >25 Will recheck weight in 3-6 months **    Labs pending Health Maintenance reviewed Diet and exercise encouraged   Follow up plan: 3 months   Mary-Margaret Hassell Done, FNP

## 2019-04-13 ENCOUNTER — Other Ambulatory Visit: Payer: Self-pay

## 2019-04-13 DIAGNOSIS — Z20822 Contact with and (suspected) exposure to covid-19: Secondary | ICD-10-CM

## 2019-04-15 LAB — NOVEL CORONAVIRUS, NAA: SARS-CoV-2, NAA: NOT DETECTED

## 2019-07-03 ENCOUNTER — Other Ambulatory Visit: Payer: Self-pay

## 2019-07-05 ENCOUNTER — Other Ambulatory Visit: Payer: Self-pay

## 2019-07-05 ENCOUNTER — Encounter: Payer: Self-pay | Admitting: Nurse Practitioner

## 2019-07-05 ENCOUNTER — Ambulatory Visit (INDEPENDENT_AMBULATORY_CARE_PROVIDER_SITE_OTHER): Payer: Medicare Other | Admitting: Nurse Practitioner

## 2019-07-05 VITALS — BP 113/78 | HR 79 | Temp 97.1°F | Resp 20 | Ht 68.0 in | Wt 223.0 lb

## 2019-07-05 DIAGNOSIS — F410 Panic disorder [episodic paroxysmal anxiety] without agoraphobia: Secondary | ICD-10-CM

## 2019-07-05 DIAGNOSIS — F3342 Major depressive disorder, recurrent, in full remission: Secondary | ICD-10-CM | POA: Diagnosis not present

## 2019-07-05 DIAGNOSIS — K582 Mixed irritable bowel syndrome: Secondary | ICD-10-CM | POA: Diagnosis not present

## 2019-07-05 MED ORDER — DIAZEPAM 5 MG PO TABS: 5.0000 mg | ORAL_TABLET | Freq: Two times a day (BID) | ORAL | 2 refills | Status: DC | PRN

## 2019-07-05 MED ORDER — BUPROPION HCL ER (XL) 150 MG PO TB24: 150.0000 mg | ORAL_TABLET | Freq: Every day | ORAL | 1 refills | Status: DC

## 2019-07-05 NOTE — Progress Notes (Signed)
Subjective:    Patient ID: Tyler Barton, male    DOB: [birth year], 48 y.o.   MRN: [id number]   Chief Complaint: Medical Management of Chronic Issues    HPI:  1. Irritable bowel syndrome with both constipation and diarrhea Doing well. Usually has constipation and is taking OYTC meds  Daily.  2. Recurrent major depressive disorder, in full remission ([place]) Is on wellbutrin and is doing well. Depression screen [facility] 2/9 07/05/2019 03/31/2019 12/16/2018  Decreased Interest 0 0 1  Down, Depressed, Hopeless 0 0 1  PHQ - 2 Score 0 0 2  Altered sleeping - - 1  Tired, decreased energy - - 0  Change in appetite - - 0  Feeling bad or failure about yourself  - - 0  Trouble concentrating - - 1  Moving slowly or fidgety/restless - - 0  Suicidal thoughts - - 0  PHQ-9 Score - - 4  Difficult doing work/chores - - Somewhat difficult  Some recent data might be hidden     3. Panic disorder Has not had any recent panic attacks but worries all the time about covid. Valium helps. GAD 7 : Generalized Anxiety Score 07/05/2019 03/31/2019 02/20/2016 12/12/2015  Nervous, Anxious, on Edge 1 2 1 2   Control/stop worrying 2 2 3 2   Worry too much - different things 2 2 2 2   Trouble relaxing 1 0 2 1  Restless 0 0 2 1  Easily annoyed or irritable 1 0 3 2  Afraid - awful might happen 1 0 0 1  Total GAD 7 Score 8 6 13 11   Anxiety Difficulty Somewhat difficult - Very difficult Somewhat difficult  Some encounter information is confidential and restricted. Go to Review Flowsheets activity to see all data.        Outpatient Encounter Medications as of 07/05/2019  Medication Sig  . buPROPion (WELLBUTRIN XL) 150 MG 24 hr tablet Take 1 tablet (150 mg total) by mouth daily.  . diazepam (VALIUM) 5 MG tablet Take 1 tablet (5 mg total) by mouth every 12 (twelve) hours as needed. for anxiety  . loratadine (CLARITIN) 10 MG tablet Take 1 tablet (10 mg total) by mouth daily. Reported on 07/10/2015    Past Surgical  History:  Procedure Laterality Date  . KNEE ARTHROSCOPY Left 1993  . LIPOMA EXCISION      Family History  Problem Relation Age of Onset  . Anxiety disorder Mother   . Anxiety disorder Father   . Heart disease Father 53       with stent placement and bypass  . Anxiety disorder Sister   . Cancer Maternal Uncle        throat  . Alzheimer's disease Maternal Grandmother   . Cancer Paternal Grandmother   . Heart disease Maternal Uncle   . Heart disease Maternal Uncle     New complaints: Left epicondylitus has flared back up and he has started wearing hiis tennis elbow strapp yesterday  Social history: Is on disability for his anxiety  Controlled substance contract: 04/26/19    Review of Systems  Constitutional: Negative for diaphoresis.  Eyes: Negative for pain.  Respiratory: Negative for shortness of breath.   Cardiovascular: Negative for chest pain, palpitations and leg swelling.  Gastrointestinal: Negative for abdominal pain.  Endocrine: Negative for polydipsia.  Musculoskeletal: Positive for arthralgias (left elbow).  Skin: Negative for rash.  Neurological: Negative for dizziness, weakness and headaches.  Hematological: Does not bruise/bleed easily.  All other systems reviewed and  are negative.      Objective:   Physical Exam Vitals and nursing note reviewed.  Constitutional:      Appearance: Normal appearance. He is well-developed.  HENT:     Head: Normocephalic.     Nose: Nose normal.  Eyes:     Pupils: Pupils are equal, round, and reactive to light.  Neck:     Thyroid: No thyroid mass or thyromegaly.     Vascular: No carotid bruit or JVD.     Trachea: Phonation normal.  Cardiovascular:     Rate and Rhythm: Normal rate and regular rhythm.  Pulmonary:     Effort: Pulmonary effort is normal. No respiratory distress.     Breath sounds: Normal breath sounds.  Abdominal:     General: Bowel sounds are normal.     Palpations: Abdomen is soft.      Tenderness: There is no abdominal tenderness.  Musculoskeletal:        General: Normal range of motion.     Cervical back: Normal range of motion and neck supple.     Comments: Left elbow tenderness on palpation  Lymphadenopathy:     Cervical: No cervical adenopathy.  Skin:    General: Skin is warm and dry.  Neurological:     Mental Status: He is alert and oriented to person, place, and time.  Psychiatric:        Behavior: Behavior normal.        Thought Content: Thought content normal.        Judgment: Judgment normal.     BP 113/78   Pulse 79   Temp (!) 97.1 F (36.2 C) (Temporal)   Resp 20   Ht 5\' 8"  (1.727 m)   Wt 223 lb (101.2 kg)   SpO2 98%   BMI 33.91 kg/m       Assessment & Plan:  LEMAR LYGA comes in today with chief complaint of Medical Management of Chronic Issues   Diagnosis and orders addressed:  1. Irritable bowel syndrome with both constipation and diarrhea Continue to avoid foods that cause flare ups Continue OTC stool softners  2. Recurrent major depressive disorder, in full remission ([place]) Stress management - buPROPion (WELLBUTRIN XL) 150 MG 24 hr tablet; Take 1 tablet (150 mg total) by mouth daily.  Dispense: 90 tablet; Refill: 1  3. Panic disorder - diazepam (VALIUM) 5 MG tablet; Take 1 tablet (5 mg total) by mouth every 12 (twelve) hours as needed. for anxiety  Dispense: 60 tablet; Refill: 2   Labs pending Health Maintenance reviewed Diet and exercise encouraged  Follow up plan: 3 months   Mary-Margaret Hassell Done, FNP

## 2019-07-05 NOTE — Patient Instructions (Signed)

## 2019-08-03 ENCOUNTER — Other Ambulatory Visit: Payer: Self-pay

## 2019-08-03 ENCOUNTER — Ambulatory Visit: Payer: Medicare Other | Attending: Family

## 2019-08-03 DIAGNOSIS — Z20822 Contact with and (suspected) exposure to covid-19: Secondary | ICD-10-CM | POA: Diagnosis not present

## 2019-08-04 LAB — NOVEL CORONAVIRUS, NAA: SARS-CoV-2, NAA: NOT DETECTED

## 2019-08-07 ENCOUNTER — Encounter: Payer: Self-pay | Admitting: Physician Assistant

## 2019-08-07 ENCOUNTER — Ambulatory Visit (INDEPENDENT_AMBULATORY_CARE_PROVIDER_SITE_OTHER): Payer: Medicare Other | Admitting: Physician Assistant

## 2019-08-07 DIAGNOSIS — J019 Acute sinusitis, unspecified: Secondary | ICD-10-CM | POA: Diagnosis not present

## 2019-08-07 MED ORDER — FLUTICASONE PROPIONATE 50 MCG/ACT NA SUSP: 2.0000 | Freq: Every day | NASAL | 6 refills | Status: DC

## 2019-08-07 MED ORDER — AMOXICILLIN 500 MG PO CAPS: 500.0000 mg | ORAL_CAPSULE | Freq: Three times a day (TID) | ORAL | 0 refills | Status: DC

## 2019-08-07 NOTE — Progress Notes (Signed)
837  Wed  Congestion Woke up at night Diarrhea      Telephone visit  Subjective: [id number], diarrhea, PCP: Chevis Pretty, FNP [id number] Tyler Barton is a 49 y.o. male calls for telephone consult today. Patient provides verbal consent for consult held via phone.  Patient is identified with 2 separate identifiers.  At this time the entire area is on COVID-19 social distancing and stay home orders are in place.  Patient is of higher risk and therefore we are performing this by a virtual method.  Location of patient: home Location of provider: [facility] Others present for call: no  Covid test at [facility], negative last week Using OTC meds Started feeling sick last Wednesday.  He experienced headache, diarrhea and right numbness. Has history of sinuses. Face numbness and as time went on he got more sinus pressure and ear pressure in the AM.   He was in a panic attack 2 days ago and calmed down. Congestion and mucus id normal to clear. Copious drainage each morning. Fever negative   ROS: Per HPI  No Known Allergies Past Medical History:  Diagnosis Date  . Anxiety   . DDD (degenerative disc disease)     Current Outpatient Medications:  .  buPROPion (WELLBUTRIN XL) 150 MG 24 hr tablet, Take 1 tablet (150 mg total) by mouth daily., Disp: 90 tablet, Rfl: 1 .  diazepam (VALIUM) 5 MG tablet, Take 1 tablet (5 mg total) by mouth every 12 (twelve) hours as needed. for anxiety, Disp: 60 tablet, Rfl: 2 .  loratadine (CLARITIN) 10 MG tablet, Take 1 tablet (10 mg total) by mouth daily. Reported on 07/10/2015, Disp: 30 tablet, Rfl: 5  Assessment/ Plan: 49 y.o. male   1. Acute sinusitis, recurrence not specified, unspecified location - fluticasone (FLONASE) 50 MCG/ACT nasal spray; Place 2 sprays into both nostrils daily.  Dispense: 16 g; Refill: 6 - amoxicillin (AMOXIL) 500 MG capsule; Take 1 capsule (500 mg total) by mouth 3 (three) times daily.  Dispense: 30 capsule; Refill:  0    No follow-ups on file.  Continue all other maintenance medications as listed above.  Start time: 8:37 AM End time: 8:46 AM  No orders of the defined types were placed in this encounter.   Particia Nearing PA-C [place] [phone]

## 2019-08-08 ENCOUNTER — Encounter: Payer: Self-pay | Admitting: *Deleted

## 2019-09-11 ENCOUNTER — Telehealth: Payer: Self-pay | Admitting: Nurse Practitioner

## 2019-09-11 MED ORDER — BUPROPION HCL ER (XL) 300 MG PO TB24: 300.0000 mg | ORAL_TABLET | Freq: Every day | ORAL | 1 refills | Status: DC

## 2019-09-11 NOTE — Telephone Encounter (Signed)
Increase wellbutrin to 300mg XL-  1 daily- that is the next dose up

## 2019-09-11 NOTE — Telephone Encounter (Signed)
Patient aware and verbalizes understanding. 

## 2019-09-11 NOTE — Telephone Encounter (Signed)
Patients wife called concerned that he has pain in his left testicle. Has been going on for about a month. No swelling. Appt made for this Thursday to evaluate

## 2019-09-13 ENCOUNTER — Other Ambulatory Visit: Payer: Self-pay

## 2019-09-14 ENCOUNTER — Ambulatory Visit (INDEPENDENT_AMBULATORY_CARE_PROVIDER_SITE_OTHER): Payer: Medicare Other | Admitting: Nurse Practitioner

## 2019-09-14 ENCOUNTER — Encounter: Payer: Self-pay | Admitting: Nurse Practitioner

## 2019-09-14 VITALS — BP 121/80 | HR 73 | Temp 97.5°F | Resp 20 | Ht 68.0 in | Wt 211.0 lb

## 2019-09-14 DIAGNOSIS — S76212A Strain of adductor muscle, fascia and tendon of left thigh, initial encounter: Secondary | ICD-10-CM | POA: Diagnosis not present

## 2019-09-14 NOTE — Patient Instructions (Signed)
Hernia, Adult     A hernia happens when tissue inside your body pushes out through a weak spot in your belly muscles (abdominal wall). This makes a round lump (bulge). The lump may be:  In a scar from surgery that was done in your belly (incisional hernia).  Near your belly button (umbilical hernia).  In your groin (inguinal hernia). Your groin is the area where your leg meets your lower belly (abdomen). This kind of hernia could also be: ? In your scrotum, if you are male. ? In folds of skin around your vagina, if you are male.  In your upper thigh (femoral hernia).  Inside your belly (hiatal hernia). This happens when your stomach slides above the muscle between your belly and your chest (diaphragm). If your hernia is small and it does not cause pain, you may not need treatment. If your hernia is large or it causes pain, you may need surgery. Follow these instructions at home: Activity  Avoid stretching or overusing (straining) the muscles near your hernia. Straining can happen when you: ? Lift something heavy. ? Poop (have a bowel movement).  Do not lift anything that is heavier than 10 lb (4.5 kg), or the limit that you are told, until your doctor says that it is safe.  Use the strength of your legs when you lift something heavy. Do not use only your back muscles to lift. General instructions  Do these things if told by your doctor so you do not have trouble pooping (constipation): ? Drink enough fluid to keep your pee (urine) pale yellow. ? Eat foods that are high in fiber. These include fresh fruits and vegetables, whole grains, and beans. ? Limit foods that are high in fat and processed sugars. These include foods that are fried or sweet. ? Take medicine for trouble pooping.  When you cough, try to cough gently.  You may try to push your hernia in by very gently pressing on it when you are lying down. Do not try to force the bulge back in if it will not push in  easily.  If you are overweight, work with your doctor to lose weight safely.  Do not use any products that have nicotine or tobacco in them. These include cigarettes and e-cigarettes. If you need help quitting, ask your doctor.  If you will be having surgery (hernia repair), watch your hernia for changes in shape, size, or color. Tell your doctor if you see any changes.  Take over-the-counter and prescription medicines only as told by your doctor.  Keep all follow-up visits as told by your doctor. Contact a doctor if:  You get new pain, swelling, or redness near your hernia.  You poop fewer times in a week than normal.  You have trouble pooping.  You have poop (stool) that is more dry than normal.  You have poop that is harder or larger than normal. Get help right away if:  You have a fever.  You have belly pain that gets worse.  You feel sick to your stomach (nauseous).  You throw up (vomit).  Your hernia cannot be pushed in by very gently pressing on it when you are lying down. Do not try to force the bulge back in if it will not push in easily.  Your hernia: ? Changes in shape or size. ? Changes color. ? Feels hard or it hurts when you touch it. These symptoms may represent a serious problem that is an emergency. Do not   wait to see if the symptoms will go away. Get medical help right away. Call your local emergency services (911 in the [place] Summary  A hernia happens when tissue inside your body pushes out through a weak spot in the belly muscles. This creates a bulge.  If your hernia is small and it does not hurt, you may not need treatment. If your hernia is large or it hurts, you may need surgery.  If you will be having surgery, watch your hernia for changes in shape, size, or color. Tell your doctor about any changes. This information is not intended to replace advice given to you by your health care provider. Make sure you discuss any questions you have with  your health care provider. Document Revised: 10/13/2018 Document Reviewed: 03/24/2017 Elsevier Patient Education  2020 Elsevier Inc.  

## 2019-09-14 NOTE — Progress Notes (Signed)
   Subjective:    Patient ID: Tyler Barton, male    DOB: 1971-06-04, 49 y.o.   MRN: [id number]   Chief Complaint: Left testicle pain   HPI Pain in left testicle for the last 2-3 months. Denies swelling or masses. Pain is increased with adduction of the left leg. Pain initially was more intense but is now more of a dull pain. Pain initially was persistent but now only occurs with specific movements. Rates pain at 2/10 currently. Believes that he may have strained or pulled a muscle while moving something heavy but does not remember a specific incident.    Review of Systems  Constitutional: Negative for diaphoresis.  Eyes: Negative for pain.  Respiratory: Negative for shortness of breath.   Cardiovascular: Negative for chest pain, palpitations and leg swelling.  Gastrointestinal: Negative for abdominal pain.  Endocrine: Negative for polydipsia.  Skin: Negative for rash.  Neurological: Negative for dizziness, weakness and headaches.  Hematological: Does not bruise/bleed easily.  All other systems reviewed and are negative.      Objective:   Physical Exam Vitals and nursing note reviewed.  Constitutional:      Appearance: Normal appearance.  Cardiovascular:     Rate and Rhythm: Normal rate and regular rhythm.     Heart sounds: Normal heart sounds.  Pulmonary:     Breath sounds: Normal breath sounds.  Genitourinary:    Penis: Normal.      Testes: Normal.     Comments: No testicular pain on palpation Small left inguinal hernia- no pain- scrotal sac clear. Skin:    General: Skin is warm.  Neurological:     General: No focal deficit present.     Mental Status: He is alert.    BP 121/80   Pulse 73   Temp (!) 97.5 F (36.4 C) (Temporal)   Resp 20   Ht 5\' 8"  (1.727 m)   Wt 211 lb (95.7 kg)   SpO2 99%   BMI 32.08 kg/m         Assessment & Plan:  Tyler Barton in today with chief complaint of Left testicle pain   1. Inguinal strain, left, initial encounter No  heavy lifting or straining Motrin every 6 hours for 3-4 days- take with food RTO prn    The above assessment and management plan was discussed with the patient. The patient verbalized understanding of and has agreed to the management plan. Patient is aware to call the clinic if symptoms persist or worsen. Patient is aware when to return to the clinic for a follow-up visit. Patient educated on when it is appropriate to go to the emergency department.   Mary-Margaret Hassell Done, FNP

## 2019-09-25 ENCOUNTER — Telehealth: Payer: Self-pay | Admitting: Nurse Practitioner

## 2019-09-25 NOTE — Chronic Care Management (AMB) (Signed)
  Chronic Care Management   Note  09/25/2019 Name: Tyler Barton MRN: [id number] DOB: Jul 04, 1971  Tyler Barton is a 49 y.o. year old male who is a primary care patient of Chevis Pretty, FNP. I reached out to Tyler Barton by phone today in response to a referral sent by Tyler Barton health plan.     Tyler Barton was given information about Chronic Care Management services today including:  1. CCM service includes personalized support from designated clinical staff supervised by his physician, including individualized plan of care and coordination with other care providers 2. 24/7 contact phone numbers for assistance for urgent and routine care needs. 3. Service will only be billed when office clinical staff spend 20 minutes or more in a month to coordinate care. 4. Only one practitioner may furnish and bill the service in a calendar month. 5. The patient may stop CCM services at any time (effective at the end of the month) by phone call to the office staff. 6. The patient will be responsible for cost sharing (co-pay) of up to 20% of the service fee (after annual deductible is met).  Patient agreed to services and verbal consent obtained.   Follow up plan: Telephone appointment with care management team member scheduled for:03/05/2020.  [place], [place] [address] Direct Dial: [phone] [email or web address]

## 2019-10-06 ENCOUNTER — Ambulatory Visit: Payer: Self-pay | Admitting: Nurse Practitioner

## 2019-10-10 ENCOUNTER — Other Ambulatory Visit: Payer: Self-pay

## 2019-10-10 ENCOUNTER — Ambulatory Visit (INDEPENDENT_AMBULATORY_CARE_PROVIDER_SITE_OTHER): Payer: Medicare Other | Admitting: Nurse Practitioner

## 2019-10-10 ENCOUNTER — Encounter: Payer: Self-pay | Admitting: Nurse Practitioner

## 2019-10-10 VITALS — BP 123/85 | HR 84 | Temp 97.3°F | Resp 20 | Ht 68.0 in | Wt 212.0 lb

## 2019-10-10 DIAGNOSIS — K582 Mixed irritable bowel syndrome: Secondary | ICD-10-CM | POA: Diagnosis not present

## 2019-10-10 DIAGNOSIS — K4090 Unilateral inguinal hernia, without obstruction or gangrene, not specified as recurrent: Secondary | ICD-10-CM | POA: Diagnosis not present

## 2019-10-10 DIAGNOSIS — F3342 Major depressive disorder, recurrent, in full remission: Secondary | ICD-10-CM | POA: Diagnosis not present

## 2019-10-10 DIAGNOSIS — Z125 Encounter for screening for malignant neoplasm of prostate: Secondary | ICD-10-CM

## 2019-10-10 DIAGNOSIS — F410 Panic disorder [episodic paroxysmal anxiety] without agoraphobia: Secondary | ICD-10-CM

## 2019-10-10 LAB — CBC WITH DIFFERENTIAL/PLATELET
Basophils Absolute: 0.1 10*3/uL (ref 0.0–0.2)
Basos: 1 %
EOS (ABSOLUTE): 0.3 10*3/uL (ref 0.0–0.4)
Eos: 4 %
Hematocrit: 44.9 % (ref 37.5–51.0)
Hemoglobin: 14.9 g/dL (ref 13.0–17.7)
Immature Grans (Abs): 0 10*3/uL (ref 0.0–0.1)
Immature Granulocytes: 0 %
Lymphocytes Absolute: 1.9 10*3/uL (ref 0.7–3.1)
Lymphs: 29 %
MCH: 29.2 pg (ref 26.6–33.0)
MCHC: 33.2 g/dL (ref 31.5–35.7)
MCV: 88 fL (ref 79–97)
Monocytes Absolute: 0.7 10*3/uL (ref 0.1–0.9)
Monocytes: 10 %
Neutrophils Absolute: 3.7 10*3/uL (ref 1.4–7.0)
Neutrophils: 56 %
Platelets: 381 10*3/uL (ref 150–450)
RBC: 5.1 x10E6/uL (ref 4.14–5.80)
RDW: 12.9 % (ref 11.6–15.4)
WBC: 6.7 10*3/uL (ref 3.4–10.8)

## 2019-10-10 LAB — CMP14+EGFR
ALT: 47 IU/L — ABNORMAL HIGH (ref 0–44)
AST: 38 IU/L (ref 0–40)
Albumin/Globulin Ratio: 2.1 (ref 1.2–2.2)
Albumin: 4.6 g/dL (ref 4.0–5.0)
Alkaline Phosphatase: 49 IU/L (ref 39–117)
BUN/Creatinine Ratio: 12 (ref 9–20)
BUN: 14 mg/dL (ref 6–24)
Bilirubin Total: 0.7 mg/dL (ref 0.0–1.2)
CO2: 25 mmol/L (ref 20–29)
Calcium: 9.7 mg/dL (ref 8.7–10.2)
Chloride: 103 mmol/L (ref 96–106)
Creatinine, Ser: 1.2 mg/dL (ref 0.76–1.27)
GFR calc Af Amer: 82 mL/min/{1.73_m2} (ref >59)
GFR calc non Af Amer: 71 mL/min/{1.73_m2} (ref >59)
Globulin, Total: 2.2 g/dL (ref 1.5–4.5)
Glucose: 107 mg/dL — ABNORMAL HIGH (ref 65–99)
Potassium: 4.4 mmol/L (ref 3.5–5.2)
Sodium: 141 mmol/L (ref 134–144)
Total Protein: 6.8 g/dL (ref 6.0–8.5)

## 2019-10-10 MED ORDER — DIAZEPAM 5 MG PO TABS: 5.0000 mg | ORAL_TABLET | Freq: Two times a day (BID) | ORAL | 2 refills | Status: DC | PRN

## 2019-10-10 MED ORDER — BUPROPION HCL ER (XL) 300 MG PO TB24: 300.0000 mg | ORAL_TABLET | Freq: Every day | ORAL | 2 refills | Status: DC

## 2019-10-10 NOTE — Patient Instructions (Signed)
° °Inguinal Hernia, Adult °An inguinal hernia is when fat or your intestines push through a weak spot in a muscle where your leg meets your lower belly (groin). This causes a rounded lump (bulge). This kind of hernia could also be: °· In your scrotum, if you are male. °· In folds of skin around your vagina, if you are male. °There are three types of inguinal hernias. These include: °· Hernias that can be pushed back into the belly (are reducible). This type rarely causes pain. °· Hernias that cannot be pushed back into the belly (are incarcerated). °· Hernias that cannot be pushed back into the belly and lose their blood supply (are strangulated). This type needs emergency surgery. °If you do not have symptoms, you may not need treatment. If you have symptoms or a large hernia, you may need surgery. °Follow these instructions at home: °Lifestyle °· Do these things if told by your doctor so you do not have trouble pooping (constipation): °? Drink enough fluid to keep your pee (urine) pale yellow. °? Eat foods that have a lot of fiber. These include fresh fruits and vegetables, whole grains, and beans. °? Limit foods that are high in fat and processed sugars. These include foods that are fried or sweet. °? Take medicine for trouble pooping. °· Avoid lifting heavy objects. °· Avoid standing for long amounts of time. °· Do not use any products that contain nicotine or tobacco. These include cigarettes and e-cigarettes. If you need help quitting, ask your doctor. °· Stay at a healthy weight. °General instructions °· You may try to push your hernia in by very gently pressing on it when you are lying down. Do not try to force the bulge back in if it will not push in easily. °· Watch your hernia for any changes in shape, size, or color. Tell your doctor if you see any changes. °· Take over-the-counter and prescription medicines only as told by your doctor. °· Keep all follow-up visits as told by your doctor. This is  important. °Contact a doctor if: °· You have a fever. °· You have new symptoms. °· Your symptoms get worse. °Get help right away if: °· The area where your leg meets your lower belly has: °? Pain that gets worse suddenly. °? A bulge that gets bigger suddenly, and it does not get smaller after that. °? A bulge that turns red or purple. °? A bulge that is painful when you touch it. °· You are a man, and your scrotum: °? Suddenly feels painful. °? Suddenly changes in size. °· You cannot push the hernia in by very gently pressing on it when you are lying down. Do not try to force the bulge back in if it will not push in easily. °· You feel sick to your stomach (nauseous), and that feeling does not go away. °· You throw up (vomit), and that keeps happening. °· You have a fast heartbeat. °· You cannot poop (have a bowel movement) or pass gas. °These symptoms may be an emergency. Do not wait to see if the symptoms will go away. Get medical help right away. Call your local emergency services (911 in the [place] °Summary °· An inguinal hernia is when fat or your intestines push through a weak spot in a muscle where your leg meets your lower belly (groin). This causes a rounded lump (bulge). °· If you do not have symptoms, you may not need treatment. If you have symptoms or a large hernia,   you may need surgery. °· Avoid lifting heavy objects. Also avoid standing for long amounts of time. °· Do not try to force the bulge back in if it will not push in easily. °This information is not intended to replace advice given to you by your health care provider. Make sure you discuss any questions you have with your health care provider. °Document Revised: 07/24/2017 Document Reviewed: 03/24/2017 °Elsevier Patient Education © 2020 Elsevier Inc. ° °

## 2019-10-10 NOTE — Progress Notes (Addendum)
Subjective:    Patient ID: Tyler Barton, male    DOB: [birth year], 49 y.o.   MRN: [id number]   Chief Complaint: Medical Management of Chronic Issues    HPI:  1. Irritable bowel syndrome with both constipation and diarrhea Takes OTC stool softener and is not having any problems.  2. Recurrent major depressive disorder, in full remission ([place]) Current dose of bupropion is helping to relax and calm him.  3. Panic disorder Is under control for the most part unless he gets really upset. Is taking the diazepam twice daily and is working well.  GAD 7 : Generalized Anxiety Score 10/10/2019 07/05/2019 03/31/2019 02/20/2016  Nervous, Anxious, on Edge '2 1 2 1  '$ Control/stop worrying '3 2 2 3  '$ Worry too much - different things '3 2 2 2  '$ Trouble relaxing 2 1 0 2  Restless 0 0 0 2  Easily annoyed or irritable 1 1 0 3  Afraid - awful might happen 0 1 0 0  Total GAD 7 Score '11 8 6 13  '$ Anxiety Difficulty Not difficult at all Somewhat difficult - Very difficult  Some encounter information is confidential and restricted. Go to Review Flowsheets activity to see all data.     Outpatient Encounter Medications as of 10/10/2019  Medication Sig  . buPROPion (WELLBUTRIN XL) 300 MG 24 hr tablet Take 1 tablet (300 mg total) by mouth daily.  . diazepam (VALIUM) 5 MG tablet Take 1 tablet (5 mg total) by mouth every 12 (twelve) hours as needed. for anxiety  . fluticasone (FLONASE) 50 MCG/ACT nasal spray Place 2 sprays into both nostrils daily.  Marland Kitchen loratadine (CLARITIN) 10 MG tablet Take 1 tablet (10 mg total) by mouth daily. Reported on 07/10/2015   No facility-administered encounter medications on file as of 10/10/2019.    Past Surgical History:  Procedure Laterality Date  . KNEE ARTHROSCOPY Left 1993  . LIPOMA EXCISION      Family History  Problem Relation Age of Onset  . Anxiety disorder Mother   . Anxiety disorder Father   . Heart disease Father 55       with stent placement and bypass  . Anxiety  disorder Sister   . Cancer Maternal Uncle        throat  . Alzheimer's disease Maternal Grandmother   . Cancer Paternal Grandmother   . Heart disease Maternal Uncle   . Heart disease Maternal Uncle     New complaints: Is still having some left testicular pain which has not gotten worse but would like to see if he still needs the weight restriction.  Social history: Lives with wife and things are good at home.  Controlled substance contract: Signed 03-31-19.     Review of Systems  Constitutional: Negative.   HENT: Negative.   Eyes: Negative.   Respiratory: Negative.   Cardiovascular: Negative.   Gastrointestinal: Positive for constipation.       Nontender ridges on sides of abdomen  Endocrine: Negative.   Genitourinary: Positive for testicular pain (left testicular pain).  Musculoskeletal: Negative.   Skin: Negative.   Neurological: Negative.   Psychiatric/Behavioral: The patient is nervous/anxious (under control with current medications).        Objective:   Physical Exam Vitals and nursing note reviewed.  Constitutional:      Appearance: Normal appearance.  HENT:     Head: Normocephalic.     Right Ear: Tympanic membrane normal.     Left Ear: Tympanic membrane normal.  Nose: Nose normal.     Mouth/Throat:     Mouth: Mucous membranes are moist.     Pharynx: Oropharynx is clear.  Eyes:     Pupils: Pupils are equal, round, and reactive to light.  Cardiovascular:     Rate and Rhythm: Normal rate and regular rhythm.     Pulses: Normal pulses.     Heart sounds: Normal heart sounds.  Pulmonary:     Effort: Pulmonary effort is normal.     Breath sounds: Normal breath sounds.  Abdominal:     General: Bowel sounds are normal.     Palpations: Abdomen is soft.  Musculoskeletal:        General: Normal range of motion.     Cervical back: Normal range of motion and neck supple.  Skin:    General: Skin is warm and dry.     Capillary Refill: Capillary refill takes  less than 2 seconds.  Neurological:     Mental Status: He is alert and oriented to person, place, and time.  Psychiatric:        Behavior: Behavior normal.    BP 123/85   Pulse 84   Temp (!) 97.3 F (36.3 C) (Temporal)   Resp 20   Ht '5\' 8"'$  (1.727 m)   Wt 212 lb (96.2 kg)   SpO2 98%   BMI 32.23 kg/m       Assessment & Plan:  SANEL STEMMER comes in today with chief complaint of Medical Management of Chronic Issues   Diagnosis and orders addressed:  1. Irritable bowel syndrome with both constipation and diarrhea Take OTC laxatives PRN.  2. Recurrent major depressive disorder, in full remission ([place]) Stress management. - buPROPion (WELLBUTRIN XL) 300 MG 24 hr tablet; Take 1 tablet (300 mg total) by mouth daily.  Dispense: 30 tablet; Refill: 2  3. Panic disorder - diazepam (VALIUM) 5 MG tablet; Take 1 tablet (5 mg total) by mouth every 12 (twelve) hours as needed. for anxiety  Dispense: 60 tablet; Refill: 2  4. Left inguinal hernia Avoid heavy lifting. - Ambulatory referral to General Surgery  Orders Placed This Encounter  Procedures  . CBC with Differential/Platelet  . CMP14+EGFR  . Ambulatory referral to General Surgery    Referral Priority:   Routine    Referral Type:   Surgical    Referral Reason:   Specialty Services Required    Requested Specialty:   General Surgery    Number of Visits Requested:   1   Meds ordered this encounter  Medications  . diazepam (VALIUM) 5 MG tablet    Sig: Take 1 tablet (5 mg total) by mouth every 12 (twelve) hours as needed. for anxiety    Dispense:  60 tablet    Refill:  2    Order Specific Question:   Supervising Provider    Answer:   Caryl Pina A [id number]  . buPROPion (WELLBUTRIN XL) 300 MG 24 hr tablet    Sig: Take 1 tablet (300 mg total) by mouth daily.    Dispense:  30 tablet    Refill:  2    Order Specific Question:   Supervising Provider    Answer:   Caryl Pina A [id number]     Labs pending Health  Maintenance reviewed Diet and exercise encouraged  Follow up plan: 6 months   Mary-Margaret Hassell Done, FNP

## 2019-10-10 NOTE — Addendum Note (Signed)
Addended by: Chevis Pretty on: 10/10/2019 09:04 AM   Modules accepted: Orders

## 2019-10-17 ENCOUNTER — Ambulatory Visit (INDEPENDENT_AMBULATORY_CARE_PROVIDER_SITE_OTHER): Payer: Medicare Other

## 2019-10-17 ENCOUNTER — Other Ambulatory Visit: Payer: Self-pay

## 2019-10-17 ENCOUNTER — Ambulatory Visit (INDEPENDENT_AMBULATORY_CARE_PROVIDER_SITE_OTHER): Payer: Medicare Other | Admitting: General Surgery

## 2019-10-17 ENCOUNTER — Encounter: Payer: Self-pay | Admitting: General Surgery

## 2019-10-17 VITALS — BP 132/82 | HR 76 | Temp 98.3°F | Resp 12 | Ht 69.0 in | Wt 211.0 lb

## 2019-10-17 DIAGNOSIS — Z0000 Encounter for general adult medical examination without abnormal findings: Secondary | ICD-10-CM

## 2019-10-17 DIAGNOSIS — N50812 Left testicular pain: Secondary | ICD-10-CM | POA: Diagnosis not present

## 2019-10-17 NOTE — Progress Notes (Signed)
MEDICARE ANNUAL WELLNESS VISIT  10/17/2019  Telephone Visit Disclaimer This Medicare AWV was conducted by telephone due to national recommendations for restrictions regarding the COVID-19 Pandemic (e.g. social distancing).  I verified, using two identifiers, that I am speaking with Tyler Barton or their authorized healthcare agent. I discussed the limitations, risks, security, and privacy concerns of performing an evaluation and management service by telephone and the potential availability of an in-person appointment in the future. The patient expressed understanding and agreed to proceed.   Subjective:  Tyler Barton is a 49 y.o. male patient of Chevis Pretty, [place] who had a Medicare Annual Wellness Visit today via telephone.  Tyler Barton resides locally in [place] with his wife and step son. He has four children of his own and one was adopted because he had the child at the early age of 4. He has two step children. 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 He enjoys working in his shop and piddling with cars. He has recently been diagnosed with a hernia and this is keeping him from doing anything strenuous.   Patient Care Team: Chevis Pretty, FNP as PCP - General (Family Medicine) Ilean China, RN as Registered Nurse  Advanced Directives 10/17/2019 03/08/2018  Does Patient Have a Medical Advance Directive? No No  Would patient like information on creating a medical advance directive? No - Patient declined Yes (MAU/Ambulatory/Procedural Areas - Information given)    Hospital Utilization Over the Past 12 Months: # of hospitalizations or ER visits: 0 # of surgeries: 0  Review of Systems    Patient reports that his overall health is unchanged compared to last year.  History obtained from chart review  Patient Reported Readings (BP, Pulse, CBG, Weight, etc) none  Pain Assessment Pain : 0-10 Pain Type: Chronic pain Pain Location: Knee Pain  Orientation: Left Pain Descriptors / Indicators: Discomfort Pain Onset: More than a month ago Pain Frequency: Intermittent     Current Medications & Allergies (verified) Allergies as of 10/17/2019   No Known Allergies     Medication List       Accurate as of October 17, 2019  9:15 AM. If you have any questions, ask your nurse or doctor.        buPROPion 300 MG 24 hr tablet Commonly known as: Wellbutrin XL Take 1 tablet (300 mg total) by mouth daily.   diazepam 5 MG tablet Commonly known as: VALIUM Take 1 tablet (5 mg total) by mouth every 12 (twelve) hours as needed. for anxiety   fluticasone 50 MCG/ACT nasal spray Commonly known as: FLONASE Place 2 sprays into both nostrils daily.   loratadine 10 MG tablet Commonly known as: CLARITIN Take 1 tablet (10 mg total) by mouth daily. Reported on 07/10/2015       History (reviewed): Past Medical History:  Diagnosis Date  . Anxiety   . DDD (degenerative disc disease)    Past Surgical History:  Procedure Laterality Date  . KNEE ARTHROSCOPY Left 1993  . LIPOMA EXCISION     Family History  Problem Relation Age of Onset  . Anxiety disorder Mother   . Anxiety disorder Father   . Heart disease Father 61       with stent placement and bypass  . Anxiety disorder Sister   . Cancer Maternal Uncle        throat  . Alzheimer's disease Maternal Grandmother   . Cancer Paternal Grandmother   .  Heart disease Maternal Uncle   . Heart disease Maternal Uncle    Social History   Socioeconomic History  . Marital status: Married    Spouse name: Not on file  . Number of children: 4  . Years of education: 10  . Highest education level: 10th grade  Occupational History  . Occupation: disabled    Comment: truck driver  Tobacco Use  . Smoking status: Never Smoker  . Smokeless tobacco: Never Used  Substance and Sexual Activity  . Alcohol use: No  . Drug use: No  . Sexual activity: Yes  Other Topics Concern  . Not on file    Social History Narrative  . Not on file   Social Determinants of Health   Financial Resource Strain:   . Difficulty of Paying Living Expenses:   Food Insecurity:   . Worried About Charity fundraiser in the Last Year:   . Arboriculturist in the Last Year:   Transportation Needs:   . Film/video editor (Medical):   Marland Kitchen Lack of Transportation (Non-Medical):   Physical Activity:   . Days of Exercise per Week:   . Minutes of Exercise per Session:   Stress:   . Feeling of Stress :   Social Connections:   . Frequency of Communication with Friends and Family:   . Frequency of Social Gatherings with Friends and Family:   . Attends Religious Services:   . Active Member of Clubs or Organizations:   . Attends Archivist Meetings:   Marland Kitchen Marital Status:     Activities of Daily Living In your present state of health, do you have any difficulty performing the following activities: 10/17/2019  Hearing? N  Vision? N  Difficulty concentrating or making decisions? N  Walking or climbing stairs? N  Dressing or bathing? N  Doing errands, shopping? N  Preparing Food and eating ? N  Using the Toilet? N  In the past six months, have you accidently leaked urine? N  Do you have problems with loss of bowel control? N  Managing your Medications? N  Managing your Finances? N  Housekeeping or managing your Housekeeping? N  Some recent data might be hidden    Patient Education/ Literacy How often do you need to have someone help you when you read instructions, pamphlets, or other written materials from your doctor or pharmacy?: 1 - Never What is the last grade level you completed in school?: 9th grade  Exercise Current Exercise Habits: The patient does not participate in regular exercise at present, Exercise limited by: None identified  Diet Patient reports consuming 3 meals a day and 2 snack(s) a day Patient reports that his primary diet is: Regular Patient reports that she does  have regular access to food.   Depression Screen PHQ 2/9 Scores 10/10/2019 09/14/2019 07/05/2019 03/31/2019 12/16/2018 09/20/2018 06/21/2018  PHQ - 2 Score 0 0 0 0 2 0 1  PHQ- 9 Score - - - - 4 - -     Fall Risk Fall Risk  10/17/2019 10/10/2019 09/14/2019 07/05/2019 03/31/2019  Falls in the past year? 0 0 0 0 0     Objective:  Tyler Barton seemed alert and oriented and he participated appropriately during our telephone visit.  Blood Pressure Weight BMI  BP Readings from Last 3 Encounters:  10/10/19 123/85  09/14/19 121/80  07/05/19 113/78   Wt Readings from Last 3 Encounters:  10/10/19 212 lb (96.2 kg)  09/14/19 211 lb (  95.7 kg)  07/05/19 223 lb (101.2 kg)   BMI Readings from Last 1 Encounters:  10/10/19 32.23 kg/m    *Unable to obtain current vital signs, weight, and BMI due to telephone visit type  Hearing/Vision  . Tyler Barton did not seem to have difficulty with hearing/understanding during the telephone conversation . Reports that he has not had a formal eye exam by an eye care professional within the past year . Reports that he has not had a formal hearing evaluation within the past year *Unable to fully assess hearing and vision during telephone visit type  Cognitive Function: 6CIT Screen 10/17/2019  What Year? 0 points  What month? 0 points  What time? 0 points  Count back from 20 0 points  Months in reverse 0 points  Repeat phrase 0 points  Total Score 0   (Normal:0-7, Significant for Dysfunction: >8)  Normal Cognitive Function Screening: Yes   Immunization & Health Maintenance Record  There is no immunization history on file for this patient.  Health Maintenance  Topic Date Due  . INFLUENZA VACCINE  02/04/2020  . TETANUS/TDAP  03/06/2021  . HIV Screening  Completed       Assessment  This is a routine wellness examination for Tyler Barton.  Health Maintenance: Due or Overdue There are no preventive care reminders to display for this patient.  Tyler Barton does not need a referral for Community Assistance: Care Management:   no Social Work:    no Prescription Assistance:  no Nutrition/Diabetes Education:  no   Plan:  Personalized Goals Goals Addressed            This Visit's Progress   . DIET - EAT MORE FRUITS AND VEGETABLES        Personalized Health Maintenance & Screening Recommendations  Patient needs to schedule an eye exam  Lung Cancer Screening Recommended: no (Low Dose CT Chest recommended if Age 31-80 years, 30 pack-year currently smoking OR have quit w/in past 15 years) Hepatitis C Screening recommended: not applicable HIV Screening recommended: no  Advanced Directives: Written information was not prepared per patient's request.  Referrals & Orders No orders of the defined types were placed in this encounter.   Follow-up Plan . Follow-up with Chevis Pretty, FNP as planned . Schedule an eye exam  I have personally reviewed and noted the following in the patient's chart:   . Medical and social history . Use of alcohol, tobacco or illicit drugs  . Current medications and supplements . Functional ability and status . Nutritional status . Physical activity . Advanced directives . List of other physicians . Hospitalizations, surgeries, and ER visits in previous 12 months . Vitals . Screenings to include cognitive, depression, and falls . Referrals and appointments  In addition, I have reviewed and discussed with Tyler Barton certain preventive protocols, quality metrics, and best practice recommendations. A written personalized care plan for preventive services as well as general preventive health recommendations is available and can be mailed to the patient at his request.      Rolena Infante LPN [id number]

## 2019-10-17 NOTE — Patient Instructions (Signed)
  Tyler Barton , Thank you for taking time to come for your Medicare Wellness Visit. I appreciate your ongoing commitment to your health goals. Please review the following plan we discussed and let me know if I can assist you in the future.   These are the goals we discussed: Goals    . DIET - EAT MORE FRUITS AND VEGETABLES    . Exercise 150 min/wk Moderate Activity       This is a list of the screening recommended for you and due dates:  Health Maintenance  Topic Date Due  . Flu Shot  02/04/2020  . Tetanus Vaccine  03/06/2021  . HIV Screening  Completed

## 2019-10-17 NOTE — Progress Notes (Signed)
[facility] History and Physical  Reason for Referral: Left testicular pain, ? Hernia  Referring Physician:  Chevis Pretty, FNP   Chief Complaint    New Patient (Initial Visit)      Tyler Barton is a 49 y.o. male.  HPI: Mr. Tyler Barton is a very sweet 49 yo who has a history of left testicle pain and findings concerning for hernia. He has a history of anxiety and panic attacks, and says he takes Wellbutrin and diazepam PRN for this. He is on disability now after having knee pain and panic attacks and he use to drive a truck.  He has been noticing pain specifically in the left testicle for the past 3 months. He has never noted a bulge. He has regular Bms but says he is mostly constipated. He takes a regular bowel regimen of stool softeners and miralax for constipation.   Past Medical History:  Diagnosis Date  . Anxiety   . DDD (degenerative disc disease)     Past Surgical History:  Procedure Laterality Date  . KNEE ARTHROSCOPY Left 1993  . LIPOMA EXCISION      Family History  Problem Relation Age of Onset  . Anxiety disorder Mother   . Anxiety disorder Father   . Heart disease Father 109       with stent placement and bypass  . Anxiety disorder Sister   . Cancer Maternal Uncle        throat  . Alzheimer's disease Maternal Grandmother   . Cancer Paternal Grandmother   . Heart disease Maternal Uncle   . Heart disease Maternal Uncle     Social History   Tobacco Use  . Smoking status: Never Smoker  . Smokeless tobacco: Never Used  Substance Use Topics  . Alcohol use: Yes    Comment: ocassionally  . Drug use: No    Medications: I have reviewed the patient's current medications. Allergies as of 10/17/2019   No Known Allergies     Medication List       Accurate as of October 17, 2019 12:59 PM. If you have any questions, ask your nurse or doctor.        buPROPion 300 MG 24 hr tablet Commonly known as: Wellbutrin XL Take 1 tablet (300 mg  total) by mouth daily.   diazepam 5 MG tablet Commonly known as: VALIUM Take 1 tablet (5 mg total) by mouth every 12 (twelve) hours as needed. for anxiety   fluticasone 50 MCG/ACT nasal spray Commonly known as: FLONASE Place 2 sprays into both nostrils daily.   loratadine 10 MG tablet Commonly known as: CLARITIN Take 1 tablet (10 mg total) by mouth daily. Reported on 07/10/2015        ROS:  A comprehensive review of systems was negative except for: Gastrointestinal: positive for constipation Genitourinary: positive for left testcile pain, in the scrotum, no bulge  Blood pressure 132/82, pulse 76, temperature 98.3 F (36.8 C), temperature source Oral, resp. rate 12, height 5\' 9"  (1.753 m), weight 211 lb (95.7 kg), SpO2 96 %. Physical Exam Vitals reviewed. Exam conducted with a chaperone present.  Constitutional:      Appearance: He is normal weight.  HENT:     Head: Normocephalic and atraumatic.     Nose: Nose normal.     Mouth/Throat:     Mouth: Mucous membranes are moist.  Eyes:     Extraocular Movements: Extraocular movements intact.  Cardiovascular:     Rate and Rhythm: Normal  rate and regular rhythm.  Pulmonary:     Effort: Pulmonary effort is normal.     Breath sounds: Normal breath sounds.  Abdominal:     General: There is no distension.     Palpations: Abdomen is soft.     Tenderness: There is no abdominal tenderness.     Hernia: There is no hernia in the left inguinal area or right inguinal area.  Genitourinary:    Penis: Normal.      Testes:        Left: Tenderness present. Swelling not present.     Comments: No obvious hernia on the left or right, pain in the inferior  Testicle on the left but no overt cord pain or pain in the ring, no obvious herniation of bowel noted  Musculoskeletal:     Cervical back: Normal range of motion.  Neurological:     Mental Status: He is alert.     Results: None   Assessment & Plan:  Tyler Barton is a 49 y.o. male  with left testicle pain that is intermittent but no reported bulge. He has been having this for the past 3 months. His PCP checked him and is concerned for possible hernia. On my exam today, there is nothing obvious but he is tender in the left testicle. He could have something else going on with the testicle.   -US scrotum to assess the left testicle and check for hernia  -May need further workup -He is anxious about any surgery and agrees with further workup at this time.  -Will call with results   Pending hernia and needing repair we discussed that this is not an emergency. We discussed things to look out for regarding pain and reasons to go to the ED.   Discussed the risk and benefits including, bleeding, infection, use of mesh, risk of recurrence, risk of nerve damage causing numbness or changes in sensation, risk of damage to the cord structures. The patient understands the risk and benefits of repair with mesh, and has decided to proceed.  We also discussed open versus laparoscopic surgery and the use of mesh. We discussed that I do open repairs with mesh, and that this is considered equivalent to laparoscopic surgery. We discussed reasons for opting for laparoscopic surgery including if a bilateral repair is needed or if a patient has a recurrence after an open repair.  All questions were answered to the satisfaction of the patient.  Virl Cagey 10/17/2019, 12:59 PM

## 2019-10-17 NOTE — Patient Instructions (Addendum)
Ultrasound scrotum scheduled @ [facility] 10/20/19@ 12:15.    Will check ultrasound to check testicle and look for hernia.  Will call you with results and will discuss plans from there.  Information on hernia and hernia repair pending [place].  Hernia, Adult     A hernia happens when tissue inside your body pushes out through a weak spot in your belly muscles (abdominal wall). This makes a round lump (bulge). The lump may be:  In a scar from surgery that was done in your belly (incisional hernia).  Near your belly button (umbilical hernia).  In your groin (inguinal hernia). Your groin is the area where your leg meets your lower belly (abdomen). This kind of hernia could also be: ? In your scrotum, if you are male. ? In folds of skin around your vagina, if you are male.  In your upper thigh (femoral hernia).  Inside your belly (hiatal hernia). This happens when your stomach slides above the muscle between your belly and your chest (diaphragm). If your hernia is small and it does not cause pain, you may not need treatment. If your hernia is large or it causes pain, you may need surgery. Follow these instructions at home: Activity  Avoid stretching or overusing (straining) the muscles near your hernia. Straining can happen when you: ? Lift something heavy. ? Poop (have a bowel movement).  Do not lift anything that is heavier than 10 lb (4.5 kg), or the limit that you are told, until your doctor says that it is safe.  Use the strength of your legs when you lift something heavy. Do not use only your back muscles to lift. General instructions  Do these things if told by your doctor so you do not have trouble pooping (constipation): ? Drink enough fluid to keep your pee (urine) pale yellow. ? Eat foods that are high in fiber. These include fresh fruits and vegetables, whole grains, and beans. ? Limit foods that are high in fat and processed sugars. These include foods that are fried or  sweet. ? Take medicine for trouble pooping.  When you cough, try to cough gently.  You may try to push your hernia in by very gently pressing on it when you are lying down. Do not try to force the bulge back in if it will not push in easily.  If you are overweight, work with your doctor to lose weight safely.  Do not use any products that have nicotine or tobacco in them. These include cigarettes and e-cigarettes. If you need help quitting, ask your doctor.  If you will be having surgery (hernia repair), watch your hernia for changes in shape, size, or color. Tell your doctor if you see any changes.  Take over-the-counter and prescription medicines only as told by your doctor.  Keep all follow-up visits as told by your doctor. Contact a doctor if:  You get new pain, swelling, or redness near your hernia.  You poop fewer times in a week than normal.  You have trouble pooping.  You have poop (stool) that is more dry than normal.  You have poop that is harder or larger than normal. Get help right away if:  You have a fever.  You have belly pain that gets worse.  You feel sick to your stomach (nauseous).  You throw up (vomit).  Your hernia cannot be pushed in by very gently pressing on it when you are lying down. Do not try to force the bulge back  in if it will not push in easily.  Your hernia: ? Changes in shape or size. ? Changes color. ? Feels hard or it hurts when you touch it. These symptoms may represent a serious problem that is an emergency. Do not wait to see if the symptoms will go away. Get medical help right away. Call your local emergency services (911 in the [place] Summary  A hernia happens when tissue inside your body pushes out through a weak spot in the belly muscles. This creates a bulge.  If your hernia is small and it does not hurt, you may not need treatment. If your hernia is large or it hurts, you may need surgery.  If you will be having surgery,  watch your hernia for changes in shape, size, or color. Tell your doctor about any changes. This information is not intended to replace advice given to you by your health care provider. Make sure you discuss any questions you have with your health care provider. Document Revised: 10/13/2018 Document Reviewed: 03/24/2017 Elsevier Patient Education  [place] Repair, Adult  Open hernia repair is a surgical procedure to fix a hernia. A hernia occurs when an internal organ or tissue pushes out through a weak spot in the abdominal wall muscles. Hernias commonly occur in the groin and around the navel. Most hernias tend to get worse over time. Often, surgery is done to prevent the hernia from becoming bigger, uncomfortable, or an emergency. Emergency surgery may be needed if abdominal contents get stuck in the opening (incarcerated hernia) or the blood supply gets cut off (strangulated hernia). In an open repair, an incision is made in the abdomen to perform the surgery. Tell a health care provider about:  Any allergies you have.  All medicines you are taking, including vitamins, herbs, eye drops, creams, and over-the-counter medicines.  Any problems you or family members have had with anesthetic medicines.  Any blood or bone disorders you have.  Any surgeries you have had.  Any medical conditions you have, including any recent cold or flu symptoms.  Whether you are pregnant or may be pregnant. What are the risks? Generally, this is a safe procedure. However, problems may occur, including:  Long-lasting (chronic) pain.  Bleeding.  Infection.  Damage to the testicle. This can cause shrinking or swelling.  Damage to the bladder, blood vessels, intestine, or nerves near the hernia.  Trouble passing urine.  Allergic reactions to medicines.  Return of the hernia. Medicines  Ask your health care provider about: ? Changing or stopping your regular medicines.  This is especially important if you are taking diabetes medicines or blood thinners. ? Taking medicines such as aspirin and ibuprofen. These medicines can thin your blood. Do not take these medicines before your procedure if your health care provider instructs you not to.  You may be given antibiotic medicine to help prevent infection. General instructions  You may have blood tests or imaging studies.  Ask your health care provider how your surgical site will be marked or identified.  If you smoke, do not smoke for at least 2 weeks before your procedure or for as long as told by your health care provider.  Let your health care provider know if you develop a cold or any infection before your surgery.  Plan to have someone take you home from the hospital or clinic.  If you will be going home right after the procedure, plan to have someone with you  for 24 hours. What happens during the procedure?  To reduce your risk of infection: ? Your health care team will wash or sanitize their hands. ? Your skin will be washed with soap. ? Hair may be removed from the surgical area.  An IV tube will be inserted into one of your veins.  You will be given one or more of the following: ? A medicine to help you relax (sedative). ? A medicine to numb the area (local anesthetic). ? A medicine to make you fall asleep (general anesthetic).  Your surgeon will make an incision over the hernia.  The tissues of the hernia will be moved back into place.  The edges of the hernia may be stitched together.  The opening in the abdominal muscles will be closed with stitches (sutures). Or, your surgeon will place a mesh patch made of manmade (synthetic) material over the opening.  The incision will be closed.  A bandage (dressing) may be placed over the incision. The procedure may vary among health care providers and hospitals. What happens after the procedure?  Your blood pressure, heart rate, breathing  rate, and blood oxygen level will be monitored until the medicines you were given have worn off.  You may be given medicine for pain.  Do not drive for 24 hours if you received a sedative. This information is not intended to replace advice given to you by your health care provider. Make sure you discuss any questions you have with your health care provider. Document Revised: 06/04/2017 Document Reviewed: 12/04/2015 Elsevier Patient Education  2020 [employer].

## 2019-10-20 ENCOUNTER — Other Ambulatory Visit: Payer: Self-pay

## 2019-10-20 ENCOUNTER — Ambulatory Visit (HOSPITAL_COMMUNITY)
Admission: RE | Admit: 2019-10-20 | Discharge: 2019-10-20 | Disposition: A | Discharge status: Home / Self Care | Payer: Medicare Other | Source: Ambulatory Visit | Attending: General Surgery | Admitting: General Surgery

## 2019-10-20 DIAGNOSIS — N50812 Left testicular pain: Secondary | ICD-10-CM | POA: Diagnosis not present

## 2019-10-20 DIAGNOSIS — N503 Cyst of epididymis: Secondary | ICD-10-CM | POA: Diagnosis not present

## 2019-10-21 ENCOUNTER — Telehealth: Payer: Self-pay | Admitting: General Surgery

## 2019-10-21 DIAGNOSIS — N50812 Left testicular pain: Secondary | ICD-10-CM

## 2019-10-21 NOTE — Telephone Encounter (Signed)
[facility]  CLINICAL DATA:  LEFT testicular pain for 4 months  EXAM: SCROTAL ULTRASOUND  DOPPLER ULTRASOUND OF THE TESTICLES  TECHNIQUE: Complete ultrasound examination of the testicles, epididymis, and other scrotal structures was performed. Color and spectral Doppler ultrasound were also utilized to evaluate blood flow to the testicles.  COMPARISON:  None  FINDINGS: Right testicle  Measurements: 4.9 x 2.8 x 3.4 cm. Normal echogenicity without mass or calcification. Internal blood flow present on color Doppler imaging.  Left testicle  Measurements: 5.0 x 2.4 x 3.1 cm. Normal echogenicity without mass or calcification. Internal blood flow present on color Doppler imaging.  Right epididymis:  Normal in size and appearance.  Left epididymis: 2 small epididymal cysts 9 x 4 x 7 mm and 4 x 2 x 2 mm  Hydrocele:  Small BILATERAL hydroceles  Varicocele:  Question BILATERAL varicoceles  Pulsed Doppler interrogation of both testes demonstrates normal low resistance arterial and venous waveforms bilaterally.  IMPRESSION: Small cysts within LEFT epididymal head 9 x 4 x 7 mm and 4 x 2 x 2 mm.  Question small BILATERAL varicoceles.  Normal appearing testes.   Electronically Signed   By: Lavonia Dana M.D.   On: 10/20/2019 15:47   I spoke to Urology, Dr. Alyson Ingles to see his opinion on the small cyst and varicocele and he said potentially had a thrombosed varicocele causing the pain but the cyst unlikely to cause the pain.    There was no obvious hernia on my exam and nothing obvious on the [place], but I explained to the patient that the [place] cannot completely rule out a hernia.   Told patient recommendations of the jock strap, NSAIDs, heating pad/ warm compresses.  Patient also reporting issues with erection and wondering if the varicocele could cause issues with that. I told him I do not think so. We discussed his issues with erection at  the office and he spoke about his anxiety and age, and I told him this is likely the cause.    I have offered him the option of following up with me to get rechecked for a hernia versus seeing Dr. Alyson Ingles to go over everything also and discuss his other concerns like the issues with erections.   Given all of his concerns, the testicle pain likely varicocele, the issues with erection, he is going to do the jock strap and heat, NSAIDs, and wants referral to Urology.   Curlene Labrum, MD [facility] [address] [place], [place] [address] [phone] (office)

## 2019-11-13 ENCOUNTER — Telehealth: Payer: Self-pay | Admitting: Nurse Practitioner

## 2019-11-13 NOTE — Telephone Encounter (Signed)
  Incoming Patient Call  11/13/2019  What symptoms do you have? SINUS INF, Numbness in face sinus drainage, congestion pressure on forehead he is getting ready to go on vacation would like for MMM to prescribe an antibiotic does not want to be miserable at beach  How long have you been sick? Since Friday   Have you been seen for this problem? no  If your provider decides to give you a prescription, which pharmacy would you like for it to be sent to? [facility]   Patient informed that this information will be sent to the clinical staff for review and that they should receive a follow up call.

## 2019-11-13 NOTE — Telephone Encounter (Signed)
Patient advised that he would need to have a phone or video visit to be evaluated to see if an antibiotic is needed.  No appointments are available until tomorrow afternoon.  Patient reports he is leaving in the morning to go to the beach so he will just go on to the urgent care.

## 2019-11-27 ENCOUNTER — Ambulatory Visit: Payer: Medicare Other | Admitting: Urology

## 2019-12-25 ENCOUNTER — Other Ambulatory Visit: Payer: Self-pay

## 2019-12-25 ENCOUNTER — Telehealth: Payer: Self-pay

## 2019-12-25 NOTE — Telephone Encounter (Signed)
Returned pt.'s call to confirm he wanted to cancel appt.

## 2019-12-27 ENCOUNTER — Ambulatory Visit: Payer: Medicare Other | Admitting: Urology

## 2020-01-11 ENCOUNTER — Other Ambulatory Visit: Payer: Self-pay | Admitting: Nurse Practitioner

## 2020-01-11 ENCOUNTER — Other Ambulatory Visit: Payer: Self-pay

## 2020-01-11 ENCOUNTER — Encounter: Payer: Self-pay | Admitting: Nurse Practitioner

## 2020-01-11 ENCOUNTER — Encounter (INDEPENDENT_AMBULATORY_CARE_PROVIDER_SITE_OTHER): Payer: Medicare Other | Admitting: Nurse Practitioner

## 2020-01-11 DIAGNOSIS — F410 Panic disorder [episodic paroxysmal anxiety] without agoraphobia: Secondary | ICD-10-CM

## 2020-01-11 MED ORDER — DIAZEPAM 5 MG PO TABS: 5.0000 mg | ORAL_TABLET | Freq: Two times a day (BID) | ORAL | 2 refills | Status: DC | PRN

## 2020-01-25 ENCOUNTER — Ambulatory Visit (INDEPENDENT_AMBULATORY_CARE_PROVIDER_SITE_OTHER): Payer: Self-pay | Admitting: Nurse Practitioner

## 2020-01-25 ENCOUNTER — Other Ambulatory Visit: Payer: Self-pay

## 2020-01-25 ENCOUNTER — Encounter: Payer: Self-pay | Admitting: Nurse Practitioner

## 2020-01-25 VITALS — BP 133/89 | HR 84 | Temp 97.8°F | Ht 69.0 in

## 2020-01-25 DIAGNOSIS — F410 Panic disorder [episodic paroxysmal anxiety] without agoraphobia: Secondary | ICD-10-CM

## 2020-01-25 DIAGNOSIS — J019 Acute sinusitis, unspecified: Secondary | ICD-10-CM

## 2020-01-25 DIAGNOSIS — R3911 Hesitancy of micturition: Secondary | ICD-10-CM

## 2020-01-25 DIAGNOSIS — Z125 Encounter for screening for malignant neoplasm of prostate: Secondary | ICD-10-CM

## 2020-01-25 DIAGNOSIS — Z136 Encounter for screening for cardiovascular disorders: Secondary | ICD-10-CM | POA: Diagnosis not present

## 2020-01-25 DIAGNOSIS — J0100 Acute maxillary sinusitis, unspecified: Secondary | ICD-10-CM

## 2020-01-25 DIAGNOSIS — Z0000 Encounter for general adult medical examination without abnormal findings: Secondary | ICD-10-CM

## 2020-01-25 DIAGNOSIS — F3342 Major depressive disorder, recurrent, in full remission: Secondary | ICD-10-CM

## 2020-01-25 DIAGNOSIS — K582 Mixed irritable bowel syndrome: Secondary | ICD-10-CM

## 2020-01-25 DIAGNOSIS — Z024 Encounter for examination for driving license: Secondary | ICD-10-CM

## 2020-01-25 LAB — URINALYSIS
Bilirubin, UA: NEGATIVE
Glucose, UA: NEGATIVE
Ketones, UA: NEGATIVE
Leukocytes,UA: NEGATIVE
Nitrite, UA: NEGATIVE
Protein,UA: NEGATIVE
RBC, UA: NEGATIVE
Specific Gravity, UA: >1.03 — ABNORMAL HIGH (ref 1.005–1.030)
Urobilinogen, Ur: 0.2 mg/dL (ref 0.2–1.0)
pH, UA: 5.5 (ref 5.0–7.5)

## 2020-01-25 MED ORDER — DIAZEPAM 5 MG PO TABS: 5.0000 mg | ORAL_TABLET | Freq: Two times a day (BID) | ORAL | 5 refills | Status: DC | PRN

## 2020-01-25 MED ORDER — FLUTICASONE PROPIONATE 50 MCG/ACT NA SUSP: 2.0000 | Freq: Every day | NASAL | 6 refills | Status: DC

## 2020-01-25 MED ORDER — LORATADINE 10 MG PO TABS: 10.0000 mg | ORAL_TABLET | Freq: Every day | ORAL | 5 refills | Status: DC

## 2020-01-25 MED ORDER — BUPROPION HCL ER (XL) 300 MG PO TB24: 300.0000 mg | ORAL_TABLET | Freq: Every day | ORAL | 1 refills | Status: DC

## 2020-01-25 NOTE — Patient Instructions (Signed)
Stress, Adult Stress is a normal reaction to life events. Stress is what you feel when life demands more than you are used to, or more than you think you can handle. Some stress can be useful, such as studying for a test or meeting a deadline at work. Stress that occurs too often or for too long can cause problems. It can affect your emotional health and interfere with relationships and normal daily activities. Too much stress can weaken your body's defense system (immune system) and increase your risk for physical illness. If you already have a medical problem, stress can make it worse. What are the causes? All sorts of life events can cause stress. An event that causes stress for one person may not be stressful for another person. Major life events, whether positive or negative, commonly cause stress. Examples include:  Losing a job or starting a new job.  Losing a loved one.  Moving to a new town or home.  Getting married or divorced.  Having a baby.  Getting injured or sick. Less obvious life events can also cause stress, especially if they occur day after day or in combination with each other. Examples include:  Working long hours.  Driving in traffic.  Caring for children.  Being in debt.  Being in a difficult relationship. What are the signs or symptoms? Stress can cause emotional symptoms, including:  Anxiety. This is feeling worried, afraid, on edge, overwhelmed, or out of control.  Anger, including irritation or impatience.  Depression. This is feeling sad, down, helpless, or guilty.  Trouble focusing, remembering, or making decisions. Stress can cause physical symptoms, including:  Aches and pains. These may affect your head, neck, back, stomach, or other areas of your body.  Tight muscles or a clenched jaw.  Low energy.  Trouble sleeping. Stress can cause unhealthy behaviors, including:  Eating to feel better (overeating) or skipping meals.  Working too  much or putting off tasks.  Smoking, drinking alcohol, or using drugs to feel better. How is this diagnosed? Stress is diagnosed through an assessment by your health care provider. He or she may diagnose this condition based on:  Your symptoms and any stressful life events.  Your medical history.  Tests to rule out other causes of your symptoms. Depending on your condition, your health care provider may refer you to a specialist for further evaluation. How is this treated?  Stress management techniques are the recommended treatment for stress. Medicine is not typically recommended for the treatment of stress. Techniques to reduce your reaction to stressful life events include:  Stress identification. Monitor yourself for symptoms of stress and identify what causes stress for you. These skills may help you to avoid or prepare for stressful events.  Time management. Set your priorities, keep a calendar of events, and learn to say no. Taking these actions can help you avoid making too many commitments. Techniques for coping with stress include:  Rethinking the problem. Try to think realistically about stressful events rather than ignoring them or overreacting. Try to find the positives in a stressful situation rather than focusing on the negatives.  Exercise. Physical exercise can release both physical and emotional tension. The key is to find a form of exercise that you enjoy and do it regularly.  Relaxation techniques. These relax the body and mind. The key is to find one or more that you enjoy and use the techniques regularly. Examples include: ? Meditation, deep breathing, or progressive relaxation techniques. ? Yoga or   tai chi. ? Biofeedback, mindfulness techniques, or journaling. ? Listening to music, being out in nature, or participating in other hobbies.  Practicing a healthy lifestyle. Eat a balanced diet, drink plenty of water, limit or avoid caffeine, and get plenty of  sleep.  Having a strong support network. Spend time with family, friends, or other people you enjoy being around. Express your feelings and talk things over with someone you trust. Counseling or talk therapy with a mental health professional may be helpful if you are having trouble managing stress on your own. Follow these instructions at home: Lifestyle   Avoid drugs.  Do not use any products that contain nicotine or tobacco, such as cigarettes, e-cigarettes, and chewing tobacco. If you need help quitting, ask your health care provider.  Limit alcohol intake to no more than 1 drink a day for nonpregnant women and 2 drinks a day for men. One drink equals 12 oz of beer, 5 oz of wine, or 1 oz of hard liquor  Do not use alcohol or drugs to relax.  Eat a balanced diet that includes fresh fruits and vegetables, whole grains, lean meats, fish, eggs, and beans, and low-fat dairy. Avoid processed foods and foods high in added fat, sugar, and salt.  Exercise at least 30 minutes on 5 or more days each week.  Get 7-8 hours of sleep each night. General instructions   Practice stress management techniques as discussed with your health care provider.  Drink enough fluid to keep your urine clear or pale yellow.  Take over-the-counter and prescription medicines only as told by your health care provider.  Keep all follow-up visits as told by your health care provider. This is important. Contact a health care provider if:  Your symptoms get worse.  You have new symptoms.  You feel overwhelmed by your problems and can no longer manage them on your own. Get help right away if:  You have thoughts of hurting yourself or others. If you ever feel like you may hurt yourself or others, or have thoughts about taking your own life, get help right away. You can go to your nearest emergency department or call:  Your local emergency services (911 in the [place]  A suicide crisis helpline, such as the  [place] at (316) 250-6172. This is open 24 hours a day. Summary  Stress is a normal reaction to life events. It can cause problems if it happens too often or for too long.  Practicing stress management techniques is the best way to treat stress.  Counseling or talk therapy with a mental health professional may be helpful if you are having trouble managing stress on your own. This information is not intended to replace advice given to you by your health care provider. Make sure you discuss any questions you have with your health care provider. Document Revised: 01/20/2019 Document Reviewed: 08/12/2016 Elsevier Patient Education  [place].

## 2020-01-25 NOTE — Progress Notes (Signed)
 Subjective:    Patient ID: Tyler Barton, male    DOB: 01/30/1971, 49 y.o.   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 Annual physical exam He also needs DOT form filled out today  2. Irritable bowel syndrome with both constipation and diarrhea denies any recent flare ups. He avoids foods with small seeds  3. Panic disorder He has been having panic attacks for several years. He worries all the time about everything. He is on valium BID and that seems to work best for him. GAD 7 : Generalized Anxiety Score 01/25/2020 10/10/2019 07/05/2019 03/31/2019  Nervous, Anxious, on Edge 2 2 1 2  Control/stop worrying 2 3 2 2  Worry too much - different things 2 3 2 2  Trouble relaxing 1 2 1 0  Restless 0 0 0 0  Easily annoyed or irritable 0 1 1 0  Afraid - awful might happen 2 0 1 0  Total GAD 7 Score 9 11 8 6  Anxiety Difficulty Somewhat difficult Not difficult at all Somewhat difficult -  Some encounter information is confidential and restricted. Go to Review Flowsheets activity to see all data.     4. Recurrent major depressive disorder, in full remission (HCC) he is currently on taking wellbutrin and is dong ok. He deneis any medictaion side effects. Depression screen PHQ 2/9 01/25/2020 01/11/2020 10/10/2019  Decreased Interest 0 0 0  Down, Depressed, Hopeless 0 0 0  PHQ - 2 Score 0 0 0  Altered sleeping 0 - -  Tired, decreased energy 0 - -  Change in appetite 0 - -  Feeling bad or failure about yourself  0 - -  Trouble concentrating 0 - -  Moving slowly or fidgety/restless 0 - -  Suicidal thoughts 0 - -  PHQ-9 Score 0 - -  Difficult doing work/chores - - -  Some recent data might be hidden       Outpatient Encounter Medications as of 01/25/2020  Medication Sig  . buPROPion (WELLBUTRIN XL) 300 MG 24 hr tablet Take 1 tablet (300 mg total) by mouth daily.  . diazepam (VALIUM) 5 MG tablet Take 1 tablet  (5 mg total) by mouth every 12 (twelve) hours as needed. for anxiety  . fluticasone (FLONASE) 50 MCG/ACT nasal spray Place 2 sprays into both nostrils daily.  . loratadine (CLARITIN) 10 MG tablet Take 1 tablet (10 mg total) by mouth daily. Reported on 07/10/2015     Past Surgical History:  Procedure Laterality Date  . KNEE ARTHROSCOPY Left 1993  . LIPOMA EXCISION      Family History  Problem Relation Age of Onset  . Anxiety disorder Mother   . Anxiety disorder Father   . Heart disease Father 50       with stent placement and bypass  . Anxiety disorder Sister   . Cancer Maternal Uncle        throat  . Alzheimer's disease Maternal Grandmother   . Cancer Paternal Grandmother   . Heart disease Maternal Uncle   . Heart disease Maternal Uncle     New complaints: Slight hesitancy with urination- stream not as strong as it use to be.  Social history: Lives with his wife. He has been out of work for several years due to his anxiety  Controlled substance contract: 04/26/19    Review of Systems  Constitutional: Negative for diaphoresis.  Eyes: Negative   for pain.  Respiratory: Negative for shortness of breath.   Cardiovascular: Negative for chest pain, palpitations and leg swelling.  Gastrointestinal: Negative for abdominal pain.  Endocrine: Negative for polydipsia.  Skin: Negative for rash.  Neurological: Negative for dizziness, weakness and headaches.  Hematological: Does not bruise/bleed easily.  All other systems reviewed and are negative.      Objective:   Physical Exam Vitals and nursing note reviewed.  Constitutional:      Appearance: Normal appearance. He is well-developed.  HENT:     Head: Normocephalic.     Nose: Nose normal.  Eyes:     Pupils: Pupils are equal, round, and reactive to light.  Neck:     Thyroid: No thyroid mass or thyromegaly.     Vascular: No carotid bruit or JVD.     Trachea: Phonation normal.  Cardiovascular:     Rate and Rhythm: Normal  rate and regular rhythm.  Pulmonary:     Effort: Pulmonary effort is normal. No respiratory distress.     Breath sounds: Normal breath sounds.  Abdominal:     General: Bowel sounds are normal.     Palpations: Abdomen is soft.     Tenderness: There is no abdominal tenderness.  Musculoskeletal:        General: Normal range of motion.     Cervical back: Normal range of motion and neck supple.  Lymphadenopathy:     Cervical: No cervical adenopathy.  Skin:    General: Skin is warm and dry.  Neurological:     Mental Status: He is alert and oriented to person, place, and time.  Psychiatric:        Behavior: Behavior normal.        Thought Content: Thought content normal.        Judgment: Judgment normal.    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 Annual physical exam - Urinalysis - CMP14+EGFR - Lipid panel  2. Irritable bowel syndrome with both constipation and diarrhea Continue to watch diet to prevent flare up - CBC with Differential/Platelet  3. Panic disorder Stress management - diazepam (VALIUM) 5 MG tablet; Take 1 tablet (5 mg total) by mouth every 12 (twelve) hours as needed. for anxiety  Dispense: 60 tablet; Refill: 5  4. Recurrent major depressive disorder, in full remission ([place]) - buPROPion (WELLBUTRIN XL) 300 MG 24 hr tablet; Take 1 tablet (300 mg total) by mouth daily.  Dispense: 90 tablet; Refill: 1  5. Acute sinusitis, recurrence not specified, unspecified location - fluticasone (FLONASE) 50 MCG/ACT nasal spray; Place 2 sprays into both nostrils daily.  Dispense: 16 g; Refill: 6  6. Acute non-recurrent maxillary sinusitis - loratadine (CLARITIN) 10 MG tablet; Take 1 tablet (10 mg total) by mouth daily.  Reported on 07/10/2015  Dispense: 30 tablet; Refill: 5  7. Prostate cancer screening  8. Hesitancy of micturition  - PSA, total and free   Labs pending Health Maintenance reviewed Diet and exercise encouraged  Follow up plan: 6 months   [place], FNP

## 2020-01-26 LAB — CBC WITH DIFFERENTIAL/PLATELET
Basophils Absolute: 0.1 10*3/uL (ref 0.0–0.2)
Basos: 1 %
EOS (ABSOLUTE): 0.4 10*3/uL (ref 0.0–0.4)
Eos: 5 %
Hematocrit: 43.5 % (ref 37.5–51.0)
Hemoglobin: 14.5 g/dL (ref 13.0–17.7)
Immature Grans (Abs): 0 10*3/uL (ref 0.0–0.1)
Immature Granulocytes: 0 %
Lymphocytes Absolute: 2 10*3/uL (ref 0.7–3.1)
Lymphs: 27 %
MCH: 29.7 pg (ref 26.6–33.0)
MCHC: 33.3 g/dL (ref 31.5–35.7)
MCV: 89 fL (ref 79–97)
Monocytes Absolute: 0.7 10*3/uL (ref 0.1–0.9)
Monocytes: 9 %
Neutrophils Absolute: 4.3 10*3/uL (ref 1.4–7.0)
Neutrophils: 58 %
Platelets: 379 10*3/uL (ref 150–450)
RBC: 4.88 x10E6/uL (ref 4.14–5.80)
RDW: 13.1 % (ref 11.6–15.4)
WBC: 7.4 10*3/uL (ref 3.4–10.8)

## 2020-01-26 LAB — CMP14+EGFR
ALT: 28 IU/L (ref 0–44)
AST: 29 IU/L (ref 0–40)
Albumin/Globulin Ratio: 2.6 — ABNORMAL HIGH (ref 1.2–2.2)
Albumin: 4.9 g/dL (ref 4.0–5.0)
Alkaline Phosphatase: 45 IU/L — ABNORMAL LOW (ref 48–121)
BUN/Creatinine Ratio: 10 (ref 9–20)
BUN: 12 mg/dL (ref 6–24)
Bilirubin Total: 0.7 mg/dL (ref 0.0–1.2)
CO2: 26 mmol/L (ref 20–29)
Calcium: 9.4 mg/dL (ref 8.7–10.2)
Chloride: 104 mmol/L (ref 96–106)
Creatinine, Ser: 1.19 mg/dL (ref 0.76–1.27)
GFR calc Af Amer: 82 mL/min/{1.73_m2} (ref >59)
GFR calc non Af Amer: 71 mL/min/{1.73_m2} (ref >59)
Globulin, Total: 1.9 g/dL (ref 1.5–4.5)
Glucose: 76 mg/dL (ref 65–99)
Potassium: 4 mmol/L (ref 3.5–5.2)
Sodium: 141 mmol/L (ref 134–144)
Total Protein: 6.8 g/dL (ref 6.0–8.5)

## 2020-01-26 LAB — LIPID PANEL
Chol/HDL Ratio: 5.9 ratio — ABNORMAL HIGH (ref 0.0–5.0)
Cholesterol, Total: 252 mg/dL — ABNORMAL HIGH (ref 100–199)
HDL: 43 mg/dL (ref >39)
LDL Chol Calc (NIH): 188 mg/dL — ABNORMAL HIGH (ref 0–99)
Triglycerides: 118 mg/dL (ref 0–149)
VLDL Cholesterol Cal: 21 mg/dL (ref 5–40)

## 2020-01-26 LAB — PSA, TOTAL AND FREE
PSA, Free Pct: 60 %
PSA, Free: 0.36 ng/mL
Prostate Specific Ag, Serum: 0.6 ng/mL (ref 0.0–4.0)

## 2020-02-22 ENCOUNTER — Ambulatory Visit: Payer: Medicare Other | Admitting: Urology

## 2020-03-05 ENCOUNTER — Telehealth: Payer: Medicare Other | Admitting: *Deleted

## 2020-03-15 ENCOUNTER — Ambulatory Visit (INDEPENDENT_AMBULATORY_CARE_PROVIDER_SITE_OTHER): Payer: Medicare Other | Admitting: Nurse Practitioner

## 2020-03-15 DIAGNOSIS — J00 Acute nasopharyngitis [common cold]: Secondary | ICD-10-CM

## 2020-03-15 NOTE — Progress Notes (Signed)
Virtual Visit via telephone Note Due to COVID-19 pandemic this visit was conducted virtually. This visit type was conducted due to national recommendations for restrictions regarding the COVID-19 Pandemic (e.g. social distancing, sheltering in place) in an effort to limit this patient's exposure and mitigate transmission in our community. All issues noted in this document were discussed and addressed.  A physical exam was not performed with this format.  I connected with Tyler Barton on 03/15/20 at 11:25 by telephone and verified that I am speaking with the correct person using two identifiers. BRYSAN MCEVOY is currently located at home and no one is currently with him during visit. The provider, Mary-Margaret Hassell Done, FNP is located in their office at time of visit.  I discussed the limitations, risks, security and privacy concerns of performing an evaluation and management service by telephone and the availability of in person appointments. I also discussed with the patient that there may be a patient responsible charge related to this service. The patient expressed understanding and agreed to proceed.   History and Present Illness:   Chief Complaint: Sinusitis   HPI Patient calls in c/o left sided head pressure. Congestion on left side of face. Slight congestion. Denies fever or body aches. Is taking equate sinus meds and flonase nasal spray. He has not had covid vaccine and does not know if has been exposed or not.   Review of Systems  Constitutional: Negative for chills and fever.  HENT: Positive for congestion and sinus pain. Negative for sore throat.   Respiratory: Negative for cough.   Cardiovascular: Negative.   Genitourinary: Negative.   Neurological: Positive for headaches.  Psychiatric/Behavioral: Negative.   All other systems reviewed and are negative.    Observations/Objective: Alert and oriented- answers all questions appropriately No distress    Assessment  and Plan: Tyler Barton in today with chief complaint of Sinusitis   1. Acute nasopharyngitis 1. Take meds as prescribed 2. Use a cool mist humidifier especially during the winter months and when heat has been humid. 3. Use saline nose sprays frequently 4. Saline irrigations of the nose can be very helpful if done frequently.  * 4X daily for 1 week*  * Use of a nettie pot can be helpful with this. Follow directions with this* 5. Drink plenty of fluids 6. Keep thermostat turn down low 7.For any cough or congestion  Use plain Mucinex- regular strength or max strength is fine   * Children- consult with Pharmacist for dosing 8. For fever or aces or pains- take tylenol or ibuprofen appropriate for age and weight.  * for fevers greater than 101 orally you may alternate ibuprofen and tylenol every  3 hours.   Wants covid tetsing  Quarantine until results are back  Follow Up Instructions: prn    I discussed the assessment and treatment plan with the patient. The patient was provided an opportunity to ask questions and all were answered. The patient agreed with the plan and demonstrated an understanding of the instructions.   The patient was advised to call back or seek an in-person evaluation if the symptoms worsen or if the condition fails to improve as anticipated.  The above assessment and management plan was discussed with the patient. The patient verbalized understanding of and has agreed to the management plan. Patient is aware to call the clinic if symptoms persist or worsen. Patient is aware when to return to the clinic for a follow-up visit. Patient educated on when it  is appropriate to go to the emergency department.   Time call ended:  11:40  I provided 15 minutes of non-face-to-face time during this encounter.    Mary-Margaret Hassell Done, FNP

## 2020-03-18 ENCOUNTER — Telehealth: Payer: Self-pay | Admitting: Nurse Practitioner

## 2020-03-18 ENCOUNTER — Ambulatory Visit: Payer: Medicare Other | Admitting: Urology

## 2020-03-18 LAB — NOVEL CORONAVIRUS, NAA: SARS-CoV-2, NAA: NOT DETECTED

## 2020-03-18 NOTE — Telephone Encounter (Signed)
Results not back, pt aware

## 2020-04-05 DIAGNOSIS — U071 COVID-19: Secondary | ICD-10-CM

## 2020-04-05 HISTORY — DX: COVID-19: U07.1

## 2020-04-10 ENCOUNTER — Ambulatory Visit: Payer: Self-pay | Admitting: Nurse Practitioner

## 2020-04-25 ENCOUNTER — Encounter: Payer: Self-pay | Admitting: Nurse Practitioner

## 2020-04-25 ENCOUNTER — Ambulatory Visit (INDEPENDENT_AMBULATORY_CARE_PROVIDER_SITE_OTHER): Payer: Medicare Other | Admitting: Nurse Practitioner

## 2020-04-25 DIAGNOSIS — J069 Acute upper respiratory infection, unspecified: Secondary | ICD-10-CM

## 2020-04-25 DIAGNOSIS — Z7189 Other specified counseling: Secondary | ICD-10-CM | POA: Diagnosis not present

## 2020-04-25 MED ORDER — PREDNISONE 20 MG PO TABS: ORAL_TABLET | ORAL | 0 refills | Status: DC

## 2020-04-25 NOTE — Progress Notes (Signed)
Virtual Visit via telephone Note Due to COVID-19 pandemic this visit was conducted virtually. This visit type was conducted due to national recommendations for restrictions regarding the COVID-19 Pandemic (e.g. social distancing, sheltering in place) in an effort to limit this patient's exposure and mitigate transmission in our community. All issues noted in this document were discussed and addressed.  A physical exam was not performed with this format.  I connected with Tyler Barton on 04/25/20 at 9:10 by telephone and verified that I am speaking with the correct person using two identifiers. Tyler Barton is currently located at home and no one is currently with him during visit. The provider, Mary-Margaret Hassell Done, FNP is located in their office at time of visit.  I discussed the limitations, risks, security and privacy concerns of performing an evaluation and management service by telephone and the availability of in person appointments. I also discussed with the patient that there may be a patient responsible charge related to this service. The patient expressed understanding and agreed to proceed.   History and Present Illness:   Chief Complaint: URI   HPI Patient says he took a ride on his [place] on Tuesday evening. He developed a cough after that. Has gotten worse since. Yesterday he had body aches and low grade fever. Headache developed yesterday.   Review of Systems  Constitutional: Positive for fever (99.4).  HENT: Positive for congestion. Negative for sinus pain and sore throat.   Respiratory: Positive for cough (productive).   Neurological: Positive for headaches.  Psychiatric/Behavioral: Negative.   All other systems reviewed and are negative.    Observations/Objective: Alert and oriented- answers all questions appropriately No distress No cough ir SOB noted   Assessment and Plan: Tyler Barton in today with chief complaint of URI   1. URI with  cough and congestion 1. Take meds as prescribed 2. Use a cool mist humidifier especially during the winter months and when heat has been humid. 3. Use saline nose sprays frequently 4. Saline irrigations of the nose can be very helpful if done frequently.  * 4X daily for 1 week*  * Use of a nettie pot can be helpful with this. Follow directions with this* 5. Drink plenty of fluids 6. Keep thermostat turn down low 7.For any cough or congestion  Use plain Mucinex- regular strength or max strength is fine   * Children- consult with Pharmacist for dosing 8. For fever or aces or pains- take tylenol or ibuprofen appropriate for age and weight.  * for fevers greater than 101 orally you may alternate ibuprofen and tylenol every  3 hours.    - Novel Coronavirus, NAA ([facility]); Future  Meds ordered this encounter  Medications  . predniSONE (DELTASONE) 20 MG tablet    Sig: 2 po at sametime daily for 5 days    Dispense:  10 tablet    Refill:  0    Order Specific Question:   Supervising Provider    Answer:   Worthy Rancher [[id number]]     covid education discussed   Follow Up Instructions: prn    I discussed the assessment and treatment plan with the patient. The patient was provided an opportunity to ask questions and all were answered. The patient agreed with the plan and demonstrated an understanding of the instructions.   The patient was advised to call back or seek an in-person evaluation if the symptoms worsen or if the condition fails to improve as anticipated.  The above assessment and management plan was discussed with the patient. The patient verbalized understanding of and has agreed to the management plan. Patient is aware to call the clinic if symptoms persist or worsen. Patient is aware when to return to the clinic for a follow-up visit. Patient educated on when it is appropriate to go to the emergency department.   Time call ended:  9:30  I provided 20 minutes of  non-face-to-face time during this encounter.    Mary-Margaret Hassell Done, FNP

## 2020-04-25 NOTE — Addendum Note (Signed)
Addended by: Earlene Plater on: 04/25/2020 10:25 AM   Modules accepted: Orders

## 2020-04-26 LAB — NOVEL CORONAVIRUS, NAA: SARS-CoV-2, NAA: DETECTED — AB

## 2020-04-26 LAB — SARS-COV-2, NAA 2 DAY TAT

## 2020-04-27 ENCOUNTER — Telehealth: Payer: Self-pay | Admitting: Nurse Practitioner

## 2020-04-27 ENCOUNTER — Other Ambulatory Visit (HOSPITAL_COMMUNITY): Payer: Self-pay | Admitting: Family

## 2020-04-27 ENCOUNTER — Encounter: Payer: Self-pay | Admitting: Nurse Practitioner

## 2020-04-27 DIAGNOSIS — U071 COVID-19: Secondary | ICD-10-CM

## 2020-04-27 DIAGNOSIS — Z6831 Body mass index (BMI) 31.0-31.9, adult: Secondary | ICD-10-CM | POA: Insufficient documentation

## 2020-04-27 DIAGNOSIS — F419 Anxiety disorder, unspecified: Secondary | ICD-10-CM | POA: Insufficient documentation

## 2020-04-27 NOTE — Telephone Encounter (Signed)
I called Tyler Barton to discuss Covid symptoms and the use of casirivimab/imdevimab, a monoclonal antibody infusion for those with mild to moderate Covid symptoms and at a high risk of hospitalization.    Pt is qualified for this infusion at the monoclonal antibody infusion center due to co-morbid conditions and/or a member of an at-risk group, however would like to think more about the infusion at this time. Symptoms tier reviewed as well as criteria for ending isolation.  Symptoms reviewed that would warrant ED/Hospital evaluation. Preventative practices reviewed. Patient verbalized understanding. Patient advised to call back if he decides that he does want to get infusion. Callback number to the infusion center given. Patient advised to go to Urgent care or ED with severe symptoms. Last date pt would be eligible for infusion is 10/28.      Patient Active Problem List   Diagnosis Date Noted  . Anxiety   . Morbid obesity ([place])   . COVID-19 virus infection 04/2020  . Left testicular pain 10/17/2019  . Chronic pain of left knee 11/01/2017  . Recurrent major depressive disorder, in full remission ([place]) 05/07/2017  . IBS (irritable bowel syndrome) 11/21/2014  . Panic disorder 03/14/2013    Murray Hodgkins, NP

## 2020-04-27 NOTE — Progress Notes (Signed)
I connected by phone with Tyler Barton on 04/27/2020 at 7:50 PM to discuss the potential use of a new treatment for mild to moderate COVID-19 viral infection in non-hospitalized patients.  This patient is a 49 y.o. male that meets the FDA criteria for Emergency Use Authorization of COVID monoclonal antibody casirivimab/imdevimab or bamlanivimab/eteseviamb.  Has a (+) direct SARS-CoV-2 viral test result  Has mild or moderate COVID-19   Is NOT hospitalized due to COVID-19  Is within 10 days of symptom onset  Has at least one of the high risk factor(s) for progression to severe COVID-19 and/or hospitalization as defined in EUA.  Specific high risk criteria : BMI > 25 Symptoms of no smell or taste, headache, and nasal congestion began 04/23/20 Patient has already spoken with [name] PA.   I have spoken and communicated the following to the patient or parent/caregiver regarding COVID monoclonal antibody treatment:  1. FDA has authorized the emergency use for the treatment of mild to moderate COVID-19 in adults and pediatric patients with positive results of direct SARS-CoV-2 viral testing who are 72 years of age and older weighing at least 40 kg, and who are at high risk for progressing to severe COVID-19 and/or hospitalization.  2. The significant known and potential risks and benefits of COVID monoclonal antibody, and the extent to which such potential risks and benefits are unknown.  3. Information on available alternative treatments and the risks and benefits of those alternatives, including clinical trials.  4. Patients treated with COVID monoclonal antibody should continue to self-isolate and use infection control measures (e.g., wear mask, isolate, social distance, avoid sharing personal items, clean and disinfect "high touch" surfaces, and frequent handwashing) according to CDC guidelines.   5. The patient or parent/caregiver has the option to accept or refuse COVID monoclonal  antibody treatment.  After reviewing this information with the patient, the patient has agreed to receive one of the available covid 19 monoclonal antibodies and will be provided an appropriate fact sheet prior to infusion. [name], NP 04/27/2020 7:50 PM

## 2020-04-28 ENCOUNTER — Ambulatory Visit (HOSPITAL_COMMUNITY)
Admission: RE | Admit: 2020-04-28 | Discharge: 2020-04-28 | Disposition: A | Discharge status: Home / Self Care | Payer: Medicare Other | Source: Ambulatory Visit | Attending: Pulmonary Disease | Admitting: Pulmonary Disease

## 2020-04-28 DIAGNOSIS — Z6825 Body mass index (BMI) 25.0-25.9, adult: Secondary | ICD-10-CM | POA: Diagnosis not present

## 2020-04-28 DIAGNOSIS — U071 COVID-19: Secondary | ICD-10-CM | POA: Insufficient documentation

## 2020-04-28 DIAGNOSIS — Z23 Encounter for immunization: Secondary | ICD-10-CM | POA: Insufficient documentation

## 2020-04-28 MED ORDER — DIPHENHYDRAMINE HCL 50 MG/ML IJ SOLN: 50.0000 mg | Freq: Once | INTRAMUSCULAR | Status: DC | PRN

## 2020-04-28 MED ORDER — ACETAMINOPHEN 325 MG PO TABS
650.0000 mg | ORAL_TABLET | Freq: Once | ORAL | Status: AC
  Administered 2020-04-28: 650 mg via ORAL
  Filled 2020-04-28: qty 2

## 2020-04-28 MED ORDER — ALBUTEROL SULFATE HFA 108 (90 BASE) MCG/ACT IN AERS: 2.0000 | INHALATION_SPRAY | Freq: Once | RESPIRATORY_TRACT | Status: DC | PRN

## 2020-04-28 MED ORDER — METHYLPREDNISOLONE SODIUM SUCC 125 MG IJ SOLR: 125.0000 mg | Freq: Once | INTRAMUSCULAR | Status: DC | PRN

## 2020-04-28 MED ORDER — FAMOTIDINE IN NACL 20-0.9 MG/50ML-% IV SOLN: 20.0000 mg | Freq: Once | INTRAVENOUS | Status: DC | PRN

## 2020-04-28 MED ORDER — SODIUM CHLORIDE 0.9 % IV SOLN: INTRAVENOUS | Status: DC | PRN

## 2020-04-28 MED ORDER — SODIUM CHLORIDE 0.9 % IV SOLN: Freq: Once | INTRAVENOUS | Status: AC

## 2020-04-28 MED ORDER — ONDANSETRON HCL 4 MG/2ML IJ SOLN
4.0000 mg | Freq: Once | INTRAMUSCULAR | Status: AC
  Administered 2020-04-28: 4 mg via INTRAVENOUS
  Filled 2020-04-28: qty 2

## 2020-04-28 MED ORDER — EPINEPHRINE 0.3 MG/0.3ML IJ SOAJ: 0.3000 mg | Freq: Once | INTRAMUSCULAR | Status: DC | PRN

## 2020-04-28 NOTE — Progress Notes (Signed)
  Diagnosis: COVID-19  Physician: Dr. Asencion Noble  Procedure: Covid Infusion Clinic Med: bamlanivimab\etesevimab infusion - Provided patient with bamlanimivab\etesevimab fact sheet for patients, parents and caregivers prior to infusion.  Complications: No immediate complications noted.  Discharge: Discharged home   Tyler Barton 04/28/2020

## 2020-04-28 NOTE — Discharge Instructions (Signed)

## 2020-04-30 ENCOUNTER — Telehealth: Payer: Self-pay

## 2020-04-30 ENCOUNTER — Telehealth (HOSPITAL_COMMUNITY): Payer: Self-pay

## 2020-04-30 MED ORDER — AZITHROMYCIN 250 MG PO TABS: ORAL_TABLET | ORAL | 0 refills | Status: DC

## 2020-04-30 MED ORDER — BENZONATATE 100 MG PO CAPS: 100.0000 mg | ORAL_CAPSULE | Freq: Three times a day (TID) | ORAL | 0 refills | Status: DC | PRN

## 2020-04-30 NOTE — Telephone Encounter (Signed)
Patient aware and verbalized understanding. Patient has already had the infusion on Saturday

## 2020-04-30 NOTE — Telephone Encounter (Signed)
Wife has called again o2 has dropped 13. They called infusion center and they told him he may need abx for pna and cough.

## 2020-04-30 NOTE — Telephone Encounter (Signed)
Patient's wife called the infusion clinic stating his cough has worsen since the infusion and is seeking guidance. Wife states patient has been having cough and sinus-like sx before infusion and has worsen. His oxygen levels before infusion was around 95% and now around 91%-92%. No SOB reported. Reports soreness in the chest from coughing. Recommended and strongly advised his wife to contact PCP for an evaluation and if symptoms worsen or oxygen levels drop 89% or below to go to the ER. Patient's wife agreed with plan of care and will contact his PCP.

## 2020-04-30 NOTE — Telephone Encounter (Signed)
Sent in antibiotics and cough meds- is he getting infusion

## 2020-05-02 ENCOUNTER — Other Ambulatory Visit: Payer: Self-pay | Admitting: Nurse Practitioner

## 2020-05-02 MED ORDER — PREDNISONE 20 MG PO TABS: ORAL_TABLET | ORAL | 0 refills | Status: DC

## 2020-05-02 NOTE — Progress Notes (Unsigned)
p 

## 2020-05-02 NOTE — Telephone Encounter (Signed)
I can send in some prednisone to see if will help. Cannot do codiene cough meds without being seeninoffice. Prednisone sent to pharmacy

## 2020-05-02 NOTE — Telephone Encounter (Signed)
Wife aware

## 2020-05-02 NOTE — Telephone Encounter (Signed)
Patient is taking Tessalon perles and antibiotic.  His oxygen level is 92%, at this time he has had no fever for 24 hours.  However, the wife reports the cough is horrible, takes his breath when he starts coughing and he panics.  It is mainly a dry cough, unable to cough up very much at all.  Wanting to know if something stronger could be sent in for cough or if you have any other suggestions.  Pharmacy is [facility].

## 2020-05-03 ENCOUNTER — Telehealth: Payer: Self-pay

## 2020-05-03 NOTE — Telephone Encounter (Signed)
Patient was seen 10/21- does he need another appointment

## 2020-05-03 NOTE — Telephone Encounter (Signed)
Pt called stating that MMM called him in a prescription for coughing because pt has COVID. Pt says the Rx isn't working at all and his coughing is keeping him from rest. Was told that he could get a prescription for codeine but said he would have to come in to the office to get it. MMM next opening for [facility] isnt until 05/09/20.  Please advise.

## 2020-05-06 NOTE — Telephone Encounter (Signed)
Tell patient to try delsym OTC. Nothing is going to completely suppress the cough

## 2020-05-07 ENCOUNTER — Other Ambulatory Visit: Payer: Self-pay

## 2020-05-07 ENCOUNTER — Ambulatory Visit (INDEPENDENT_AMBULATORY_CARE_PROVIDER_SITE_OTHER): Payer: Medicare Other | Admitting: Nurse Practitioner

## 2020-05-07 ENCOUNTER — Ambulatory Visit
Admission: RE | Admit: 2020-05-07 | Discharge: 2020-05-07 | Disposition: A | Discharge status: Home / Self Care | Payer: Medicare Other | Source: Ambulatory Visit | Attending: Nurse Practitioner | Admitting: Nurse Practitioner

## 2020-05-07 VITALS — BP 110/74 | HR 90 | Temp 97.1°F | Ht 69.0 in | Wt 211.0 lb

## 2020-05-07 DIAGNOSIS — R059 Cough, unspecified: Secondary | ICD-10-CM

## 2020-05-07 DIAGNOSIS — Z8616 Personal history of COVID-19: Secondary | ICD-10-CM

## 2020-05-07 MED ORDER — PROMETHAZINE-DM 6.25-15 MG/5ML PO SYRP: 5.0000 mL | ORAL_SOLUTION | Freq: Four times a day (QID) | ORAL | 0 refills | Status: DC | PRN

## 2020-05-07 MED ORDER — FAMOTIDINE 20 MG PO TABS: 20.0000 mg | ORAL_TABLET | Freq: Two times a day (BID) | ORAL | 0 refills | Status: DC

## 2020-05-07 NOTE — Patient Instructions (Addendum)
Covid 19 Cough:   Stay well hydrated  Stay active  Deep breathing exercises  May start vitamin C 2,000 mg daily, vitamin D3 2,000 IU daily, Zinc 220 mg daily, and Quercetin 500 mg twice daily  May take tylenol or fever or pain  May take mucinex  twice daily  Will order chest x ray  Will order cough syrup  Will order Pepcid  Cough: Continue gastroesophageal reflux disease treatment with elevating the head your bed and taking antacids Continue taking over-the-counter antihistamines and nasal fluticasone to help with allergic rhinitis You need to try to suppress your cough to allow your larynx (voice box) to heal.  For three days don't talk, laugh, sing, or clear your throat. Do everything you can to suppress the cough during this time. Use hard candies (sugarless Jolly Ranchers) or non-mint or non-menthol containing cough drops during this time to soothe your throat.  Use a cough suppressant (Delsym or what I have prescribed you) around the clock during this time.  After three days, gradually increase the use of your voice and back off on the cough suppressants.    Follow up:  Follow up in 2 weeks or sooner if needed

## 2020-05-07 NOTE — Progress Notes (Signed)
@Patient  ID: Tyler Barton, male    DOB: 02-23-1971, 49 y.o.   MRN: [id number]  Chief Complaint  Patient presents with  . New Patient (Initial Visit)    COVID 10/21 Sx: dry cough has been given prednisone and tessalon by PCP with no relief.     Referring provider: Chevis Pretty, *   49 year old male with history of IBS, depression, chronic pain, anxiety.  HPI  Patient presents today for post COVID care clinic visit.  He was diagnosed with Covid on 04/25/2020.  He has had a round of azithromycin and prednisone.  He did have the monoclonal antibody infusion.  He complains of ongoing cough.  He states that the cough is nonproductive.  He denies any significant shortness of breath. Denies f/c/s, n/v/d, hemoptysis, PND, chest pain or edema.        No Known Allergies   There is no immunization history on file for this patient.  Past Medical History:  Diagnosis Date  . Anxiety   . COVID-19 virus infection 04/2020   a. Ss onset 04/23/2020.  . DDD (degenerative disc disease)   . Morbid obesity ([place])     Tobacco History: Social History   Tobacco Use  Smoking Status Never Smoker  Smokeless Tobacco Never Used   Counseling given: Not Answered   Outpatient Encounter Medications as of 05/07/2020  Medication Sig  . buPROPion (WELLBUTRIN XL) 300 MG 24 hr tablet Take 1 tablet (300 mg total) by mouth daily.  . diazepam (VALIUM) 5 MG tablet Take 1 tablet (5 mg total) by mouth every 12 (twelve) hours as needed. for anxiety  . fluticasone (FLONASE) 50 MCG/ACT nasal spray Place 2 sprays into both nostrils daily.  Marland Kitchen loratadine (CLARITIN) 10 MG tablet Take 1 tablet (10 mg total) by mouth daily. Reported on 07/10/2015  . benzonatate (TESSALON PERLES) 100 MG capsule Take 1 capsule (100 mg total) by mouth 3 (three) times daily as needed for cough. (Patient not taking: Reported on 05/07/2020)  . famotidine (PEPCID) 20 MG tablet Take 1 tablet (20 mg total) by mouth 2 (two) times daily.   . promethazine-dextromethorphan (PROMETHAZINE-DM) 6.25-15 MG/5ML syrup Take 5 mLs by mouth 4 (four) times daily as needed for cough.  . [DISCONTINUED] azithromycin (ZITHROMAX Z-PAK) 250 MG tablet As directed  . [DISCONTINUED] predniSONE (DELTASONE) 20 MG tablet 2 po at sametime daily for 5 days   No facility-administered encounter medications on file as of 05/07/2020.     Review of Systems  Review of Systems  Constitutional: Negative.  Negative for fatigue and fever.  HENT: Negative.   Respiratory: Positive for cough. Negative for shortness of breath.   Cardiovascular: Negative.  Negative for chest pain, palpitations and leg swelling.  Gastrointestinal: Negative.   Allergic/Immunologic: Negative.   Neurological: Negative.   Psychiatric/Behavioral: Negative.        Physical Exam  BP 110/74 (BP Location: Left Arm)   Pulse 90   Temp (!) 97.1 F (36.2 C)   Ht 5\' 9"  (1.753 m)   Wt 211 lb (95.7 kg)   SpO2 97%   BMI 31.16 kg/m   Wt Readings from Last 5 Encounters:  05/07/20 211 lb (95.7 kg)  01/11/20 218 lb (98.9 kg)  10/17/19 211 lb (95.7 kg)  10/10/19 212 lb (96.2 kg)  09/14/19 211 lb (95.7 kg)     Physical Exam Vitals and nursing note reviewed.  Constitutional:      General: He is not in acute distress.    Appearance:  He is well-developed.  Cardiovascular:     Rate and Rhythm: Normal rate and regular rhythm.  Pulmonary:     Effort: Pulmonary effort is normal.     Breath sounds: Normal breath sounds.  Musculoskeletal:     Right lower leg: No edema.     Left lower leg: No edema.  Skin:    General: Skin is warm and dry.  Neurological:     Mental Status: He is alert and oriented to person, place, and time.  Psychiatric:        Mood and Affect: Mood normal.        Behavior: Behavior normal.        Assessment & Plan:   History of COVID-19 Cough:   Stay well hydrated  Stay active  Deep breathing exercises  May start vitamin C 2,000 mg daily,  vitamin D3 2,000 IU daily, Zinc 220 mg daily, and Quercetin 500 mg twice daily  May take tylenol or fever or pain  May take mucinex  twice daily  Will order chest x ray  Will order cough syrup  Will order Pepcid  Cough: Continue gastroesophageal reflux disease treatment with elevating the head your bed and taking antacids Continue taking over-the-counter antihistamines and nasal fluticasone to help with allergic rhinitis You need to try to suppress your cough to allow your larynx (voice box) to heal.  For three days don't talk, laugh, sing, or clear your throat. Do everything you can to suppress the cough during this time. Use hard candies (sugarless Jolly Ranchers) or non-mint or non-menthol containing cough drops during this time to soothe your throat.  Use a cough suppressant (Delsym or what I have prescribed you) around the clock during this time.  After three days, gradually increase the use of your voice and back off on the cough suppressants.    Follow up:  Follow up in 2 weeks or sooner if needed      Fenton Foy, NP 05/09/2020

## 2020-05-09 ENCOUNTER — Other Ambulatory Visit: Payer: Self-pay | Admitting: Nurse Practitioner

## 2020-05-09 DIAGNOSIS — R059 Cough, unspecified: Secondary | ICD-10-CM | POA: Insufficient documentation

## 2020-05-09 DIAGNOSIS — Z8616 Personal history of COVID-19: Secondary | ICD-10-CM | POA: Insufficient documentation

## 2020-05-09 MED ORDER — PREDNISONE 20 MG PO TABS: 20.0000 mg | ORAL_TABLET | Freq: Every day | ORAL | 0 refills | Status: AC

## 2020-05-09 NOTE — Assessment & Plan Note (Signed)
Cough:   Stay well hydrated  Stay active  Deep breathing exercises  May start vitamin C 2,000 mg daily, vitamin D3 2,000 IU daily, Zinc 220 mg daily, and Quercetin 500 mg twice daily  May take tylenol or fever or pain  May take mucinex  twice daily  Will order chest x ray  Will order cough syrup  Will order Pepcid  Cough: Continue gastroesophageal reflux disease treatment with elevating the head your bed and taking antacids Continue taking over-the-counter antihistamines and nasal fluticasone to help with allergic rhinitis You need to try to suppress your cough to allow your larynx (voice box) to heal.  For three days don't talk, laugh, sing, or clear your throat. Do everything you can to suppress the cough during this time. Use hard candies (sugarless Jolly Ranchers) or non-mint or non-menthol containing cough drops during this time to soothe your throat.  Use a cough suppressant (Delsym or what I have prescribed you) around the clock during this time.  After three days, gradually increase the use of your voice and back off on the cough suppressants.    Follow up:  Follow up in 2 weeks or sooner if needed

## 2020-05-16 ENCOUNTER — Other Ambulatory Visit: Payer: Self-pay | Admitting: Nurse Practitioner

## 2020-05-16 MED ORDER — PROMETHAZINE-DM 6.25-15 MG/5ML PO SYRP: 5.0000 mL | ORAL_SOLUTION | Freq: Four times a day (QID) | ORAL | 0 refills | Status: DC | PRN

## 2020-06-06 ENCOUNTER — Ambulatory Visit: Payer: Medicare Other

## 2020-07-29 ENCOUNTER — Encounter: Payer: Self-pay | Admitting: Nurse Practitioner

## 2020-07-29 ENCOUNTER — Other Ambulatory Visit: Payer: Self-pay

## 2020-07-29 ENCOUNTER — Ambulatory Visit (INDEPENDENT_AMBULATORY_CARE_PROVIDER_SITE_OTHER): Payer: Medicare Other | Admitting: Nurse Practitioner

## 2020-07-29 VITALS — BP 134/90 | HR 80 | Temp 97.8°F | Resp 20 | Ht 69.0 in | Wt 226.0 lb

## 2020-07-29 DIAGNOSIS — K582 Mixed irritable bowel syndrome: Secondary | ICD-10-CM

## 2020-07-29 DIAGNOSIS — J0100 Acute maxillary sinusitis, unspecified: Secondary | ICD-10-CM

## 2020-07-29 DIAGNOSIS — F410 Panic disorder [episodic paroxysmal anxiety] without agoraphobia: Secondary | ICD-10-CM | POA: Diagnosis not present

## 2020-07-29 DIAGNOSIS — F3342 Major depressive disorder, recurrent, in full remission: Secondary | ICD-10-CM | POA: Diagnosis not present

## 2020-07-29 DIAGNOSIS — F419 Anxiety disorder, unspecified: Secondary | ICD-10-CM

## 2020-07-29 MED ORDER — DIAZEPAM 5 MG PO TABS: 5.0000 mg | ORAL_TABLET | Freq: Two times a day (BID) | ORAL | 5 refills | Status: DC | PRN

## 2020-07-29 MED ORDER — LORATADINE 10 MG PO TABS: 10.0000 mg | ORAL_TABLET | Freq: Every day | ORAL | 5 refills | Status: DC

## 2020-07-29 MED ORDER — FAMOTIDINE 20 MG PO TABS: 20.0000 mg | ORAL_TABLET | Freq: Two times a day (BID) | ORAL | 2 refills | Status: DC

## 2020-07-29 MED ORDER — BUPROPION HCL ER (XL) 300 MG PO TB24: 300.0000 mg | ORAL_TABLET | Freq: Every day | ORAL | 1 refills | Status: DC

## 2020-07-29 NOTE — Patient Instructions (Signed)
Textbook of family medicine (9th ed., pp. 1062-1073). [place], [place]: [employer].">  Stress, Adult Stress is a normal reaction to life events. Stress is what you feel when life demands more than you are used to, or more than you think you can handle. Some stress can be useful, such as studying for a test or meeting a deadline at work. Stress that occurs too often or for too long can cause problems. It can affect your emotional health and interfere with relationships and normal daily activities. Too much stress can weaken your body's defense system (immune system) and increase your risk for physical illness. If you already have a medical problem, stress can make it worse. What are the causes? All sorts of life events can cause stress. An event that causes stress for one person may not be stressful for another person. Major life events, whether positive or negative, commonly cause stress. Examples include:  Losing a job or starting a new job.  Losing a loved one.  Moving to a new town or home.  Getting married or divorced.  Having a baby.  Getting injured or sick. Less obvious life events can also cause stress, especially if they occur day after day or in combination with each other. Examples include:  Working long hours.  Driving in traffic.  Caring for children.  Being in debt.  Being in a difficult relationship. What are the signs or symptoms? Stress can cause emotional symptoms, including:  Anxiety. This is feeling worried, afraid, on edge, overwhelmed, or out of control.  Anger, including irritation or impatience.  Depression. This is feeling sad, down, helpless, or guilty.  Trouble focusing, remembering, or making decisions. Stress can cause physical symptoms, including:  Aches and pains. These may affect your head, neck, back, stomach, or other areas of your body.  Tight muscles or a clenched jaw.  Low energy.  Trouble sleeping. Stress can cause unhealthy  behaviors, including:  Eating to feel better (overeating) or skipping meals.  Working too much or putting off tasks.  Smoking, drinking alcohol, or using drugs to feel better. How is this diagnosed? Stress is diagnosed through an assessment by your health care provider. He or she may diagnose this condition based on:  Your symptoms and any stressful life events.  Your medical history.  Tests to rule out other causes of your symptoms. Depending on your condition, your health care provider may refer you to a specialist for further evaluation. How is this treated? Stress management techniques are the recommended treatment for stress. Medicine is not typically recommended for the treatment of stress. Techniques to reduce your reaction to stressful life events include:  Stress identification. Monitor yourself for symptoms of stress and identify what causes stress for you. These skills may help you to avoid or prepare for stressful events.  Time management. Set your priorities, keep a calendar of events, and learn to say no. Taking these actions can help you avoid making too many commitments. Techniques for coping with stress include:  Rethinking the problem. Try to think realistically about stressful events rather than ignoring them or overreacting. Try to find the positives in a stressful situation rather than focusing on the negatives.  Exercise. Physical exercise can release both physical and emotional tension. The key is to find a form of exercise that you enjoy and do it regularly.  Relaxation techniques. These relax the body and mind. The key is to find one or more that you enjoy and use the techniques regularly. Examples include: ?   Meditation, deep breathing, or progressive relaxation techniques. ? Yoga or tai chi. ? Biofeedback, mindfulness techniques, or journaling. ? Listening to music, being out in nature, or participating in other hobbies.  Practicing a healthy lifestyle.  Eat a balanced diet, drink plenty of water, limit or avoid caffeine, and get plenty of sleep.  Having a strong support network. Spend time with family, friends, or other people you enjoy being around. Express your feelings and talk things over with someone you trust. Counseling or talk therapy with a mental health professional may be helpful if you are having trouble managing stress on your own.   Follow these instructions at home: Lifestyle  Avoid drugs.  Do not use any products that contain nicotine or tobacco, such as cigarettes, e-cigarettes, and chewing tobacco. If you need help quitting, ask your health care provider.  Limit alcohol intake to no more than 1 drink a day for nonpregnant women and 2 drinks a day for men. One drink equals 12 oz of beer, 5 oz of wine, or 1 oz of hard liquor  Do not use alcohol or drugs to relax.  Eat a balanced diet that includes fresh fruits and vegetables, whole grains, lean meats, fish, eggs, and beans, and low-fat dairy. Avoid processed foods and foods high in added fat, sugar, and salt.  Exercise at least 30 minutes on 5 or more days each week.  Get 7-8 hours of sleep each night.   General instructions  Practice stress management techniques as discussed with your health care provider.  Drink enough fluid to keep your urine clear or pale yellow.  Take over-the-counter and prescription medicines only as told by your health care provider.  Keep all follow-up visits as told by your health care provider. This is important.   Contact a health care provider if:  Your symptoms get worse.  You have new symptoms.  You feel overwhelmed by your problems and can no longer manage them on your own. Get help right away if:  You have thoughts of hurting yourself or others. If you ever feel like you may hurt yourself or others, or have thoughts about taking your own life, get help right away. You can go to your nearest emergency department or  call:  Your local emergency services (911 in the [place]  A suicide crisis helpline, such as the National Suicide Prevention Lifeline at 1-800-273-8255. This is open 24 hours a day. Summary  Stress is a normal reaction to life events. It can cause problems if it happens too often or for too long.  Practicing stress management techniques is the best way to treat stress.  Counseling or talk therapy with a mental health professional may be helpful if you are having trouble managing stress on your own. This information is not intended to replace advice given to you by your health care provider. Make sure you discuss any questions you have with your health care provider. Document Revised: 03/08/2020 Document Reviewed: 03/08/2020 Elsevier Patient Education  2021 [employer]  

## 2020-07-29 NOTE — Progress Notes (Signed)
Subjective:    Patient ID: Tyler Barton, male    DOB: 05-06-1971, 50 y.o.   MRN: [id number]   Chief Complaint: Medical Management of Chronic Issues    HPI:  1. Irritable bowel syndrome with both constipation and diarrhea Has occasional flare ups, but is tolerable  2. Anxiety Has been a worrier his whole life. Is on valium BID and says he cannot do without it. GAD 7 : Generalized Anxiety Score 01/25/2020 10/10/2019 07/05/2019 03/31/2019  Nervous, Anxious, on Edge 2 2 1 2   Control/stop worrying 2 3 2 2   Worry too much - different things 2 3 2 2   Trouble relaxing 1 2 1  0  Restless 0 0 0 0  Easily annoyed or irritable 0 1 1 0  Afraid - awful might happen 2 0 1 0  Total GAD 7 Score 9 11 8 6   Anxiety Difficulty Somewhat difficult Not difficult at all Somewhat difficult -  Some encounter information is confidential and restricted. Go to Review Flowsheets activity to see all data.     3. Panic disorder Has panic attacks when he goes out in pubic a lot. He says he doe snot go very many places.  4. Recurrent major depressive disorder, in full remission ([place]) Is on wellbutrin and is doing well.  Has no side effects from medication. Depression screen [facility] 2/9 07/29/2020 05/07/2020 01/25/2020  Decreased Interest 0 0 0  Down, Depressed, Hopeless 0 0 0  PHQ - 2 Score 0 0 0  Altered sleeping - 0 0  Tired, decreased energy - 0 0  Change in appetite - 0 0  Feeling bad or failure about yourself  - 0 0  Trouble concentrating - 0 0  Moving slowly or fidgety/restless - 0 0  Suicidal thoughts - 0 0  PHQ-9 Score - 0 0  Difficult doing work/chores - - -  Some recent data might be hidden     5. Morbid obesity ([place]) Weight is up 15lbs Wt Readings from Last 3 Encounters:  07/29/20 226 lb (102.5 kg)  05/07/20 211 lb (95.7 kg)  01/11/20 218 lb (98.9 kg)   BMI Readings from Last 3 Encounters:  07/29/20 33.37 kg/m  05/07/20 31.16 kg/m  01/25/20 32.19 kg/m       Outpatient Encounter  Medications as of 07/29/2020  Medication Sig  . buPROPion (WELLBUTRIN XL) 300 MG 24 hr tablet Take 1 tablet (300 mg total) by mouth daily.  . diazepam (VALIUM) 5 MG tablet Take 1 tablet (5 mg total) by mouth every 12 (twelve) hours as needed. for anxiety  . famotidine (PEPCID) 20 MG tablet Take 1 tablet (20 mg total) by mouth 2 (two) times daily.  . fluticasone (FLONASE) 50 MCG/ACT nasal spray Place 2 sprays into both nostrils daily.  Marland Kitchen loratadine (CLARITIN) 10 MG tablet Take 1 tablet (10 mg total) by mouth daily. Reported on 07/10/2015     Past Surgical History:  Procedure Laterality Date  . KNEE ARTHROSCOPY Left 1993  . LIPOMA EXCISION      Family History  Problem Relation Age of Onset  . Anxiety disorder Mother   . Anxiety disorder Father   . Heart disease Father 52       with stent placement and bypass  . Anxiety disorder Sister   . Cancer Maternal Uncle        throat  . Alzheimer's disease Maternal Grandmother   . Cancer Paternal Grandmother   . Heart disease Maternal Uncle   .  Heart disease Maternal Uncle     New complaints: None today  Social history: Lives with his wife Is on disability for his anxiety  Controlled substance contract: n/a    Review of Systems  Constitutional: Negative for diaphoresis.  Eyes: Negative for pain.  Respiratory: Negative for shortness of breath.   Cardiovascular: Negative for chest pain, palpitations and leg swelling.  Gastrointestinal: Negative for abdominal pain.  Endocrine: Negative for polydipsia.  Skin: Negative for rash.  Neurological: Negative for dizziness, weakness and headaches.  Hematological: Does not bruise/bleed easily.  All other systems reviewed and are negative.      Objective:   Physical Exam Vitals and nursing note reviewed.  Constitutional:      Appearance: Normal appearance. He is well-developed and well-nourished.  HENT:     Head: Normocephalic.     Nose: Nose normal.     Mouth/Throat:     Mouth:  Oropharynx is clear and moist.  Eyes:     Extraocular Movements: EOM normal.     Pupils: Pupils are equal, round, and reactive to light.  Neck:     Thyroid: No thyroid mass or thyromegaly.     Vascular: No carotid bruit or JVD.     Trachea: Phonation normal.  Cardiovascular:     Rate and Rhythm: Normal rate and regular rhythm.  Pulmonary:     Effort: Pulmonary effort is normal. No respiratory distress.     Breath sounds: Normal breath sounds.  Abdominal:     General: Bowel sounds are normal. Aorta is normal.     Palpations: Abdomen is soft.     Tenderness: There is no abdominal tenderness.  Musculoskeletal:        General: Normal range of motion.     Cervical back: Normal range of motion and neck supple.  Lymphadenopathy:     Cervical: No cervical adenopathy.  Skin:    General: Skin is warm and dry.  Neurological:     Mental Status: He is alert and oriented to person, place, and time.  Psychiatric:        Mood and Affect: Mood and affect normal.        Behavior: Behavior normal.        Thought Content: Thought content normal.        Judgment: Judgment normal.    BP 134/90   Pulse 80   Temp 97.8 F (36.6 C) (Temporal)   Resp 20   Ht $R'5\' 9"'iR$  (1.753 m)   Wt 226 lb (102.5 kg)   SpO2 96%   BMI 33.37 kg/m        Assessment & Plan:   ANSON PEDDIE comes in today with chief complaint of Medical Management of Chronic Issues   Diagnosis and orders addressed:  1. Irritable bowel syndrome with both constipation and diarrhea Watch diet - CBC with Differential/Platelet - CMP14+EGFR  2. Anxiety Stress management  3. Panic disorder - diazepam (VALIUM) 5 MG tablet; Take 1 tablet (5 mg total) by mouth every 12 (twelve) hours as needed. for anxiety  Dispense: 60 tablet; Refill: 5  4. Recurrent major depressive disorder, in full remission ([place]) - buPROPion (WELLBUTRIN XL) 300 MG 24 hr tablet; Take 1 tablet (300 mg total) by mouth daily.  Dispense: 90 tablet; Refill:  1  5. Morbid obesity ([place]) Discussed diet and exercise for person with BMI >25 Will recheck weight in 3-6 months - Lipid panel  6. Acute non-recurrent maxillary sinusitis - loratadine (CLARITIN) 10 MG tablet;  Take 1 tablet (10 mg total) by mouth daily. Reported on 07/10/2015  Dispense: 30 tablet; Refill: 5   Labs pending Health Maintenance reviewed Diet and exercise encouraged  Follow up plan: 6 months   Mary-Margaret Hassell Done, FNP

## 2020-07-30 LAB — CMP14+EGFR
ALT: 38 IU/L (ref 0–44)
AST: 31 IU/L (ref 0–40)
Albumin/Globulin Ratio: 2.3 — ABNORMAL HIGH (ref 1.2–2.2)
Albumin: 4.6 g/dL (ref 4.0–5.0)
Alkaline Phosphatase: 44 IU/L (ref 44–121)
BUN/Creatinine Ratio: 6 — ABNORMAL LOW (ref 9–20)
BUN: 8 mg/dL (ref 6–24)
Bilirubin Total: 0.5 mg/dL (ref 0.0–1.2)
CO2: 26 mmol/L (ref 20–29)
Calcium: 9.7 mg/dL (ref 8.7–10.2)
Chloride: 104 mmol/L (ref 96–106)
Creatinine, Ser: 1.31 mg/dL — ABNORMAL HIGH (ref 0.76–1.27)
GFR calc Af Amer: 73 mL/min/{1.73_m2} (ref >59)
GFR calc non Af Amer: 63 mL/min/{1.73_m2} (ref >59)
Globulin, Total: 2 g/dL (ref 1.5–4.5)
Glucose: 98 mg/dL (ref 65–99)
Potassium: 4.1 mmol/L (ref 3.5–5.2)
Sodium: 140 mmol/L (ref 134–144)
Total Protein: 6.6 g/dL (ref 6.0–8.5)

## 2020-07-30 LAB — CBC WITH DIFFERENTIAL/PLATELET
Basophils Absolute: 0.1 10*3/uL (ref 0.0–0.2)
Basos: 1 %
EOS (ABSOLUTE): 0.4 10*3/uL (ref 0.0–0.4)
Eos: 6 %
Hematocrit: 44.4 % (ref 37.5–51.0)
Hemoglobin: 14.9 g/dL (ref 13.0–17.7)
Immature Grans (Abs): 0 10*3/uL (ref 0.0–0.1)
Immature Granulocytes: 0 %
Lymphocytes Absolute: 2 10*3/uL (ref 0.7–3.1)
Lymphs: 32 %
MCH: 29.2 pg (ref 26.6–33.0)
MCHC: 33.6 g/dL (ref 31.5–35.7)
MCV: 87 fL (ref 79–97)
Monocytes Absolute: 0.7 10*3/uL (ref 0.1–0.9)
Monocytes: 11 %
Neutrophils Absolute: 3.1 10*3/uL (ref 1.4–7.0)
Neutrophils: 50 %
Platelets: 379 10*3/uL (ref 150–450)
RBC: 5.11 x10E6/uL (ref 4.14–5.80)
RDW: 13 % (ref 11.6–15.4)
WBC: 6.2 10*3/uL (ref 3.4–10.8)

## 2020-07-30 LAB — LIPID PANEL
Chol/HDL Ratio: 6 ratio — ABNORMAL HIGH (ref 0.0–5.0)
Cholesterol, Total: 223 mg/dL — ABNORMAL HIGH (ref 100–199)
HDL: 37 mg/dL — ABNORMAL LOW (ref >39)
LDL Chol Calc (NIH): 148 mg/dL — ABNORMAL HIGH (ref 0–99)
Triglycerides: 207 mg/dL — ABNORMAL HIGH (ref 0–149)
VLDL Cholesterol Cal: 38 mg/dL (ref 5–40)

## 2020-10-08 ENCOUNTER — Other Ambulatory Visit: Payer: Self-pay

## 2020-10-08 DIAGNOSIS — J019 Acute sinusitis, unspecified: Secondary | ICD-10-CM

## 2020-10-08 MED ORDER — FLUTICASONE PROPIONATE 50 MCG/ACT NA SUSP: 2.0000 | Freq: Every day | NASAL | 5 refills | Status: AC

## 2020-10-24 ENCOUNTER — Encounter: Payer: Self-pay | Admitting: Nurse Practitioner

## 2020-10-24 ENCOUNTER — Ambulatory Visit (INDEPENDENT_AMBULATORY_CARE_PROVIDER_SITE_OTHER): Payer: Medicare Other | Admitting: Nurse Practitioner

## 2020-10-24 DIAGNOSIS — J301 Allergic rhinitis due to pollen: Secondary | ICD-10-CM | POA: Diagnosis not present

## 2020-10-24 DIAGNOSIS — R42 Dizziness and giddiness: Secondary | ICD-10-CM | POA: Diagnosis not present

## 2020-10-24 MED ORDER — MECLIZINE HCL 25 MG PO TABS: 25.0000 mg | ORAL_TABLET | Freq: Three times a day (TID) | ORAL | 0 refills | Status: DC | PRN

## 2020-10-24 MED ORDER — PREDNISONE 20 MG PO TABS: 40.0000 mg | ORAL_TABLET | Freq: Every day | ORAL | 0 refills | Status: AC

## 2020-10-24 NOTE — Progress Notes (Signed)
Virtual Visit  Note Due to COVID-19 pandemic this visit was conducted virtually. This visit type was conducted due to national recommendations for restrictions regarding the COVID-19 Pandemic (e.g. social distancing, sheltering in place) in an effort to limit this patient's exposure and mitigate transmission in our community. All issues noted in this document were discussed and addressed.  A physical exam was not performed with this format.  I connected with Tyler Barton on 10/24/20 at 1:28 by telephone and verified that I am speaking with the correct person using two identifiers. Tyler Barton is currently located at home and no one  is currently with him during visit. The provider, Mary-Margaret Hassell Done, FNP is located in their office at time of visit.  I discussed the limitations, risks, security and privacy concerns of performing an evaluation and management service by telephone and the availability of in person appointments. I also discussed with the patient that there may be a patient responsible charge related to this service. The patient expressed understanding and agreed to proceed.   History and Present Illness:   Chief Complaint: URI   HPI Patient says he as outside most f day yesterday and last night he became very stopped up with headache and congestion. He is on claritin and flonase. Has  Had some dizziness during the night when rolling over in the bed as well as when he stands now.    Review of Systems  Constitutional: Positive for malaise/fatigue. Negative for chills and fever.  HENT: Positive for congestion. Negative for ear pain and sinus pain.   Respiratory: Positive for cough (slight).   Musculoskeletal: Negative for myalgias.  Neurological: Positive for dizziness and headaches.     Observations/Objective: Alert and oriented- answers all questions appropriately No distress voice hoarse No cough noted during visit  Assessment and Plan: Tyler Barton in  today with chief complaint of URI   1. Seasonal allergic rhinitis due to pollen Force fluids Continue claritin and flonase Wear mask when out side doing yard work  Meds ordered this encounter  Medications  . predniSONE (DELTASONE) 20 MG tablet    Sig: Take 2 tablets (40 mg total) by mouth daily with breakfast for 5 days. 2 po daily for 5 days    Dispense:  10 tablet    Refill:  0    Order Specific Question:   Supervising Provider    Answer:   Caryl Pina A [id number]  . meclizine (ANTIVERT) 25 MG tablet    Sig: Take 1 tablet (25 mg total) by mouth 3 (three) times daily as needed for dizziness.    Dispense:  30 tablet    Refill:  0    Order Specific Question:   Supervising Provider    Answer:   Caryl Pina A [[id number]]        Follow Up Instructions: prn    I discussed the assessment and treatment plan with the patient. The patient was provided an opportunity to ask questions and all were answered. The patient agreed with the plan and demonstrated an understanding of the instructions.   The patient was advised to call back or seek an in-person evaluation if the symptoms worsen or if the condition fails to improve as anticipated.  The above assessment and management plan was discussed with the patient. The patient verbalized understanding of and has agreed to the management plan. Patient is aware to call the clinic if symptoms persist or worsen. Patient is aware when to return to the  clinic for a follow-up visit. Patient educated on when it is appropriate to go to the emergency department.   Time call ended:  1:40  I provided 12 minutes of  non face-to-face time during this encounter.    Mary-Margaret Hassell Done, FNP

## 2020-10-30 ENCOUNTER — Telehealth: Payer: Self-pay

## 2020-10-30 ENCOUNTER — Other Ambulatory Visit: Payer: Self-pay | Admitting: Family Medicine

## 2020-10-30 MED ORDER — AMOXICILLIN-POT CLAVULANATE 875-125 MG PO TABS: 1.0000 | ORAL_TABLET | Freq: Two times a day (BID) | ORAL | 0 refills | Status: DC

## 2020-10-30 NOTE — Telephone Encounter (Signed)
I sent augmentin to [facility] in [place].

## 2020-10-30 NOTE — Telephone Encounter (Signed)
Patient aware.

## 2020-10-30 NOTE — Telephone Encounter (Signed)
Pt had televisit last week with MMM. His sinus infection is no better. Pt is asking for abx. Can abx be called in? Use [facility] pharmacy in [place]  Pt aware MMM is off today

## 2020-10-30 NOTE — Telephone Encounter (Signed)
Patient was seen by Shelah Lewandowsky on 10/24/20 and treated for allergies.  He was given Prednisone and Meclizine and advised to use Claritin and Flonase nasal spray.  His symptoms have not improved.   He still has nasal congestion, sinus pain and pressure, ears are stopped up and has headache.  He would like to know if an antibiotic can be sent to [facility] in [place].  Please advise.

## 2021-01-14 ENCOUNTER — Other Ambulatory Visit: Payer: Self-pay

## 2021-01-14 ENCOUNTER — Encounter: Payer: Self-pay | Admitting: Nurse Practitioner

## 2021-01-14 ENCOUNTER — Ambulatory Visit (INDEPENDENT_AMBULATORY_CARE_PROVIDER_SITE_OTHER): Payer: Medicare Other | Admitting: Nurse Practitioner

## 2021-01-14 VITALS — BP 133/90 | HR 78 | Temp 98.9°F | Resp 20 | Ht 69.0 in | Wt 228.0 lb

## 2021-01-14 DIAGNOSIS — K582 Mixed irritable bowel syndrome: Secondary | ICD-10-CM | POA: Diagnosis not present

## 2021-01-14 DIAGNOSIS — F419 Anxiety disorder, unspecified: Secondary | ICD-10-CM

## 2021-01-14 DIAGNOSIS — F3342 Major depressive disorder, recurrent, in full remission: Secondary | ICD-10-CM | POA: Diagnosis not present

## 2021-01-14 DIAGNOSIS — F410 Panic disorder [episodic paroxysmal anxiety] without agoraphobia: Secondary | ICD-10-CM

## 2021-01-14 DIAGNOSIS — Z1211 Encounter for screening for malignant neoplasm of colon: Secondary | ICD-10-CM

## 2021-01-14 DIAGNOSIS — N50812 Left testicular pain: Secondary | ICD-10-CM

## 2021-01-14 MED ORDER — BUPROPION HCL ER (XL) 150 MG PO TB24: 150.0000 mg | ORAL_TABLET | Freq: Every day | ORAL | 1 refills | Status: DC

## 2021-01-14 MED ORDER — DIAZEPAM 5 MG PO TABS: 5.0000 mg | ORAL_TABLET | Freq: Two times a day (BID) | ORAL | 5 refills | Status: DC | PRN

## 2021-01-14 NOTE — Patient Instructions (Signed)
Colonoscopy, Adult A colonoscopy is an exam to look at the large intestine. It is done using along, thin, flexible tube that has a camera on the end. This exam is done to check for problems, such as: Lumps (tumors). Growths (polyps). Irritation and swelling (inflammation). Bleeding. Tell your doctor about: Any allergies you have. All medicines you are taking. Tell him or her about vitamins, herbs, eye drops, creams, and over-the-counter medicines. Any problems you or family members have had with anesthetic medicines. Any blood disorders you have. Any surgeries you have had. Any medical conditions you have. Whether you are pregnant or may be pregnant. Any problems you have had with pooping (having bowel movements). What are the risks? Generally, this is a safe procedure. However, problems may occur, such as: Bleeding. Damage to your intestine. Allergic reactions to medicines given during the procedure. Infection. This is rare. What happens before the procedure? Eating and drinking Follow instructions from your doctor about eating and drinking. These may include: A few days before the procedure: Follow a low-fiber diet. Avoid these foods: Nuts. Seeds. Dried fruit. Raw fruits. Vegetables. 1-3 days before the procedure: Eat only gelatin dessert or ice pops. Drink only clear liquids, such as: Water. Clear broth or bouillon. Black coffee or tea. Clear juice. Clear soft drinks or sports drinks. Avoid liquids that have red or purple dye. On the day of the procedure: Do not eat solid foods. You may continue to drink clear liquids until up to 2 hours before the procedure. Do not eat or drink anything starting 2 hours before the procedure or as told by your doctor. Bowel prep If you were prescribed a bowel prep to take by mouth (orally) to clean out your colon: Take it as told by your doctor. Starting the day before your procedure, you will need to drink a lot of liquid medicine.  The liquid will cause you to poop until your poop is almost clear or light green. If your skin or butt gets irritated from diarrhea, you may: Wipe the area with wipes that have medicine in them, such as adult wet wipes with aloe and vitamin E. Put something on your skin that soothes the area, such as petroleum jelly. If you vomit while drinking the bowel prep: Take a break for up to 60 minutes. Begin the bowel prep again. Call your doctor if you keep vomiting and you cannot take the bowel prep without vomiting. To clean out your colon, you may also be given: Laxative medicines. These help you poop. Instructions for using a liquid medicine (enema) injected into your butt. Medicines Ask your doctor about: Changing or stopping your normal medicines. This is important. Taking aspirin and ibuprofen. Do not take these medicines unless your doctor tells you to take them. Taking over-the-counter medicines, vitamins, herbs, and supplements. General instructions Ask your doctor what steps will be taken to help prevent the spread of germs. These may include washing skin with a germ-killing soap. Plan to have someone take you home from the hospital or clinic. What happens during the procedure?  An IV tube may be put into one of your veins. You may be given one or more of the following: A medicine to help you relax (sedative). A medicine to numb the area (local anesthetic). A medicine to make you fall asleep (general anesthetic). This is rarely needed. You will lie on your side with your knees bent. The tube will: Have oil or gel put on it. Be put into the opening  of the butt (anus). Be gently put into your large intestine. Air will be sent into your colon to keep it open. You may feel some pressure or cramping. The camera will be used to take photos that will appear on a screen. A small tissue sample may be removed for testing (biopsy). If small growths are found, your doctor may remove them  and have them checked for cancer. The tube will be slowly removed. The procedure may vary among doctors and hospitals. What happens after the procedure? You will be monitored until you leave the hospital or clinic. This includes checking your: Blood pressure. Heart rate. Breathing rate. Blood oxygen level. You may have a small amount of blood in your poop. You may pass gas. You may have mild cramps or bloating in your belly (abdomen). Do not drive for 24 hours after the procedure. It is up to you to get the results of your procedure. Ask your doctor, or the department that is doing the procedure, when your results will be ready. Summary A colonoscopy is an exam to look at the large intestine. Follow instructions from your doctor about eating and drinking before the procedure. You may be prescribed an oral bowel prep to clean out your colon. Take it as told by your doctor. A flexible tube with a camera on its end will be put into the opening of the butt. It will be passed into the large intestine. You will be monitored until you leave the hospital or clinic. This information is not intended to replace advice given to you by your health care provider. Make sure you discuss any questions you have with your healthcare provider. Document Revised: 01/13/2019 Document Reviewed: 01/13/2019 Elsevier Patient Education  [employer] [place].

## 2021-01-14 NOTE — Progress Notes (Signed)
Subjective:    Patient ID: Tyler Barton, male    DOB: 05/20/1971, 50 y.o.   MRN: [id number]  Chief Complaint: medical management of chronic issues     HPI:  1. Panic disorder Patient has panic attacks when he is in big crowds. He stays at home a lot. Is unable to work due to his anxiety.  2. Recurrent major depressive disorder, in full remission ([place]) He use to be  on wellbutrin. Has stopped taking it since last visit. He says he weaned hisself off because he was having sexual side effects. He says he mind is "all over the place".  Depression screen [facility] 2/9 01/14/2021 07/29/2020 05/07/2020  Decreased Interest 0 0 0  Down, Depressed, Hopeless 0 0 0  PHQ - 2 Score 0 0 0  Altered sleeping 0 - 0  Tired, decreased energy 2 - 0  Change in appetite 2 - 0  Feeling bad or failure about yourself  0 - 0  Trouble concentrating 0 - 0  Moving slowly or fidgety/restless 0 - 0  Suicidal thoughts 0 - 0  PHQ-9 Score 4 - 0  Difficult doing work/chores Not difficult at all - -  Some recent data might be hidden     3. Anxiety Is on valium 2x a day. Cannot function without it. He feels he is worse without wellbutrin. He says if they have sex in mornings he is fine. But if waits until evenings after the day he has so many thoughts that he cannot relax. Has gotten worse since covid. He says it is hard to suppress his racing thoughts.   4. Irritable bowel syndrome with both constipation and diarrhea Has really not been having any issues.   5. Morbid obesity ([place]) No recent weight changes Wt Readings from Last 3 Encounters:  01/14/21 228 lb (103.4 kg)  07/29/20 226 lb (102.5 kg)  05/07/20 211 lb (95.7 kg)   BMI Readings from Last 3 Encounters:  01/14/21 33.67 kg/m  07/29/20 33.37 kg/m  05/07/20 31.16 kg/m     6. Left testicular pain He is still c/o left testicular pain. An U/S was done 10/20/19 which showed small cyst on epididymus and possible bil varicocele. Has occasional tenderness,  but not like it was. He thinks it is better.    Outpatient Encounter Medications as of 01/14/2021  Medication Sig   amoxicillin-clavulanate (AUGMENTIN) 875-125 MG tablet Take 1 tablet by mouth 2 (two) times daily. Take all of this medication   buPROPion (WELLBUTRIN XL) 300 MG 24 hr tablet Take 1 tablet (300 mg total) by mouth daily.   diazepam (VALIUM) 5 MG tablet Take 1 tablet (5 mg total) by mouth every 12 (twelve) hours as needed. for anxiety   famotidine (PEPCID) 20 MG tablet Take 1 tablet (20 mg total) by mouth 2 (two) times daily.   fluticasone (FLONASE) 50 MCG/ACT nasal spray Place 2 sprays into both nostrils daily.   loratadine (CLARITIN) 10 MG tablet Take 1 tablet (10 mg total) by mouth daily. Reported on 07/10/2015   meclizine (ANTIVERT) 25 MG tablet Take 1 tablet (25 mg total) by mouth 3 (three) times daily as needed for dizziness.   No facility-administered encounter medications on file as of 01/14/2021.    Past Surgical History:  Procedure Laterality Date   KNEE ARTHROSCOPY Left 1993   LIPOMA EXCISION      Family History  Problem Relation Age of Onset   Anxiety disorder Mother    Anxiety disorder Father  Heart disease Father 90       with stent placement and bypass   Anxiety disorder Sister    Cancer Maternal Uncle        throat   Alzheimer's disease Maternal Grandmother    Cancer Paternal Grandmother    Heart disease Maternal Uncle    Heart disease Maternal Uncle     New complaints: None new  Social history: Lives with wife. Is on disability for his anxiety.  Controlled substance contract: 08/06/20     Review of Systems  Constitutional:  Negative for diaphoresis.  Eyes:  Negative for pain.  Respiratory:  Negative for shortness of breath.   Cardiovascular:  Negative for chest pain, palpitations and leg swelling.  Gastrointestinal:  Negative for abdominal pain.  Endocrine: Negative for polydipsia.  Skin:  Negative for rash.  Neurological:  Negative for  dizziness, weakness and headaches.  Hematological:  Does not bruise/bleed easily.  All other systems reviewed and are negative.     Objective:   Physical Exam Vitals and nursing note reviewed.  Constitutional:      Appearance: Normal appearance. He is well-developed.  HENT:     Head: Normocephalic.     Nose: Nose normal.  Eyes:     Pupils: Pupils are equal, round, and reactive to light.  Neck:     Thyroid: No thyroid mass or thyromegaly.     Vascular: No carotid bruit or JVD.     Trachea: Phonation normal.  Cardiovascular:     Rate and Rhythm: Normal rate and regular rhythm.  Pulmonary:     Effort: Pulmonary effort is normal. No respiratory distress.     Breath sounds: Normal breath sounds.  Abdominal:     General: Bowel sounds are normal.     Palpations: Abdomen is soft.     Tenderness: There is no abdominal tenderness.  Musculoskeletal:        General: Normal range of motion.     Cervical back: Normal range of motion and neck supple.  Lymphadenopathy:     Cervical: No cervical adenopathy.  Skin:    General: Skin is warm and dry.  Neurological:     Mental Status: He is alert and oriented to person, place, and time.  Psychiatric:        Behavior: Behavior normal.        Thought Content: Thought content normal.        Judgment: Judgment normal.    BP 133/90   Pulse 78   Temp 98.9 F (37.2 C) (Temporal)   Resp 20   Ht $R'5\' 9"'uI$  (1.753 m)   Wt 228 lb (103.4 kg)   SpO2 97%   BMI 33.67 kg/m        Assessment & Plan:   DIANNA Barton comes in today with chief complaint of Medical Management of Chronic Issues   Diagnosis and orders addressed:  1. Panic disorder Stress mangement  2. Recurrent major depressive disorder, in full remission ([place]) Added wellbutrin back to meds - buPROPion (WELLBUTRIN XL) 150 MG 24 hr tablet; Take 1 tablet (150 mg total) by mouth daily.  Dispense: 90 tablet; Refill: 1  3. Anxiety Again added wellbutrin back to meds - buPROPion  (WELLBUTRIN XL) 150 MG 24 hr tablet; Take 1 tablet (150 mg total) by mouth daily.  Dispense: 90 tablet; Refill: 1  4. Irritable bowel syndrome with both constipation and diarrhea Watch diet to prevent  5. Morbid obesity ([place]) Discussed diet and exercise for person  with BMI >25 Will recheck weight in 3-6 months - diazepam (VALIUM) 5 MG tablet; Take 1 tablet (5 mg total) by mouth every 12 (twelve) hours as needed. for anxiety  Dispense: 60 tablet; Refill: 5 - CBC with Differential/Platelet - CMP14+EGFR - Lipid panel  6. Left testicular pain Continue to watch   Labs pending Health Maintenance reviewed Diet and exercise encouraged  Follow up plan: 6 months   [place], FNP

## 2021-01-15 LAB — CMP14+EGFR
ALT: 32 IU/L (ref 0–44)
AST: 28 IU/L (ref 0–40)
Albumin/Globulin Ratio: 2.6 — ABNORMAL HIGH (ref 1.2–2.2)
Albumin: 4.6 g/dL (ref 4.0–5.0)
Alkaline Phosphatase: 49 IU/L (ref 44–121)
BUN/Creatinine Ratio: 6 — ABNORMAL LOW (ref 9–20)
BUN: 7 mg/dL (ref 6–24)
Bilirubin Total: 0.5 mg/dL (ref 0.0–1.2)
CO2: 24 mmol/L (ref 20–29)
Calcium: 9.4 mg/dL (ref 8.7–10.2)
Chloride: 104 mmol/L (ref 96–106)
Creatinine, Ser: 1.15 mg/dL (ref 0.76–1.27)
Globulin, Total: 1.8 g/dL (ref 1.5–4.5)
Glucose: 117 mg/dL — ABNORMAL HIGH (ref 65–99)
Potassium: 4.2 mmol/L (ref 3.5–5.2)
Sodium: 142 mmol/L (ref 134–144)
Total Protein: 6.4 g/dL (ref 6.0–8.5)
eGFR: 78 mL/min/{1.73_m2} (ref >59)

## 2021-01-15 LAB — LIPID PANEL
Chol/HDL Ratio: 6.2 ratio — ABNORMAL HIGH (ref 0.0–5.0)
Cholesterol, Total: 229 mg/dL — ABNORMAL HIGH (ref 100–199)
HDL: 37 mg/dL — ABNORMAL LOW (ref >39)
LDL Chol Calc (NIH): 151 mg/dL — ABNORMAL HIGH (ref 0–99)
Triglycerides: 223 mg/dL — ABNORMAL HIGH (ref 0–149)
VLDL Cholesterol Cal: 41 mg/dL — ABNORMAL HIGH (ref 5–40)

## 2021-01-15 LAB — CBC WITH DIFFERENTIAL/PLATELET
Basophils Absolute: 0.1 10*3/uL (ref 0.0–0.2)
Basos: 1 %
EOS (ABSOLUTE): 0.5 10*3/uL — ABNORMAL HIGH (ref 0.0–0.4)
Eos: 7 %
Hematocrit: 45.2 % (ref 37.5–51.0)
Hemoglobin: 15 g/dL (ref 13.0–17.7)
Immature Grans (Abs): 0.1 10*3/uL (ref 0.0–0.1)
Immature Granulocytes: 1 %
Lymphocytes Absolute: 1.9 10*3/uL (ref 0.7–3.1)
Lymphs: 29 %
MCH: 29.9 pg (ref 26.6–33.0)
MCHC: 33.2 g/dL (ref 31.5–35.7)
MCV: 90 fL (ref 79–97)
Monocytes Absolute: 0.7 10*3/uL (ref 0.1–0.9)
Monocytes: 11 %
Neutrophils Absolute: 3.5 10*3/uL (ref 1.4–7.0)
Neutrophils: 51 %
Platelets: 381 10*3/uL (ref 150–450)
RBC: 5.02 x10E6/uL (ref 4.14–5.80)
RDW: 13.8 % (ref 11.6–15.4)
WBC: 6.7 10*3/uL (ref 3.4–10.8)

## 2021-01-16 ENCOUNTER — Ambulatory Visit (INDEPENDENT_AMBULATORY_CARE_PROVIDER_SITE_OTHER): Payer: Medicare Other

## 2021-01-16 VITALS — Ht 69.0 in | Wt 229.0 lb

## 2021-01-16 DIAGNOSIS — Z0000 Encounter for general adult medical examination without abnormal findings: Secondary | ICD-10-CM

## 2021-01-16 NOTE — Progress Notes (Signed)
Subjective:   MYLIN HIRANO is a 50 y.o. male who presents for Medicare Annual/Subsequent preventive examination.  Virtual Visit via Telephone Note  I connected with  [name] on 01/16/21 at  9:00 AM EDT by telephone and verified that I am speaking with the correct person using two identifiers.  Location: Patient: Home Provider: [facility] Persons participating in the virtual visit: patient/Nurse Health Advisor   I discussed the limitations, risks, security and privacy concerns of performing an evaluation and management service by telephone and the availability of in person appointments. The patient expressed understanding and agreed to proceed.  Interactive audio and video telecommunications were attempted between this nurse and patient, however failed, due to patient having technical difficulties OR patient did not have access to video capability.  We continued and completed visit with audio only.  Some vital signs may be absent or patient reported.   Cheyeanne Roadcap E Dearion Huot, LPN   Review of Systems     Cardiac Risk Factors include: male gender;obesity (BMI >30kg/m2);sedentary lifestyle;dyslipidemia     Objective:    Today's Vitals   01/16/21 0903  Weight: 229 lb (103.9 kg)  Height: 5\' 9"  (1.753 m)  PainSc: 4    Body mass index is 33.82 kg/m.  Advanced Directives 10/17/2019 03/08/2018  Does Patient Have a Medical Advance Directive? No No  Would patient like information on creating a medical advance directive? No - Patient declined Yes (MAU/Ambulatory/Procedural Areas - Information given)    Current Medications (verified) Outpatient Encounter Medications as of 01/16/2021  Medication Sig   buPROPion (WELLBUTRIN XL) 150 MG 24 hr tablet Take 1 tablet (150 mg total) by mouth daily.   diazepam (VALIUM) 5 MG tablet Take 1 tablet (5 mg total) by mouth every 12 (twelve) hours as needed. for anxiety   famotidine (PEPCID) 20 MG tablet Take 1 tablet (20 mg total) by mouth 2 (two) times  daily.   fluticasone (FLONASE) 50 MCG/ACT nasal spray Place 2 sprays into both nostrils daily.   loratadine (CLARITIN) 10 MG tablet Take 1 tablet (10 mg total) by mouth daily. Reported on 07/10/2015   meclizine (ANTIVERT) 25 MG tablet Take 1 tablet (25 mg total) by mouth 3 (three) times daily as needed for dizziness.   No facility-administered encounter medications on file as of 01/16/2021.    Allergies (verified) Patient has no known allergies.   History: Past Medical History:  Diagnosis Date   Anxiety    COVID-19 virus infection 04/2020   a. Ss onset 04/23/2020.   DDD (degenerative disc disease)    Morbid obesity (HCC)    Past Surgical History:  Procedure Laterality Date   KNEE ARTHROSCOPY Left 1993   LIPOMA EXCISION     Family History  Problem Relation Age of Onset   Anxiety disorder Mother    Anxiety disorder Father    Heart disease Father 45       with stent placement and bypass   Anxiety disorder Sister    Cancer Maternal Uncle        throat   Alzheimer's disease Maternal Grandmother    Cancer Paternal Grandmother    Heart disease Maternal Uncle    Heart disease Maternal Uncle    Social History   Socioeconomic History   Marital status: Married    Spouse name: Kieth Brightly   Number of children: 4   Years of education: 10   Highest education level: 10th grade  Occupational History   Occupation: disabled    Comment:  truck driver  Tobacco Use   Smoking status: Never   Smokeless tobacco: Never  Vaping Use   Vaping Use: Never used  Substance and Sexual Activity   Alcohol use: Yes    Comment: ocassionally   Drug use: No   Sexual activity: Yes  Other Topics Concern   Not on file  Social History Narrative   Trong resides locally in [place] with his wife and step son. He has four children of his own and one was adopted because he had the child at the early age of 28. He has two step children. 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 He enjoys working  in his shop and piddling with cars. He has recently been diagnosed with a hernia and this is keeping him from doing anything strenuous.    Social Determinants of Health   Financial Resource Strain: Low Risk    Difficulty of Paying Living Expenses: Not hard at all  Food Insecurity: No Food Insecurity   Worried About Charity fundraiser in the Last Year: Never true   [place] in the Last Year: Never true  Transportation Needs: No Transportation Needs   Lack of Transportation (Medical): No   Lack of Transportation (Non-Medical): No  Physical Activity: Inactive   Days of Exercise per Week: 0 days   Minutes of Exercise per Session: 0 min  Stress: Stress Concern Present   Feeling of Stress : Rather much  Social Connections: Moderately Isolated   Frequency of Communication with Friends and Family: More than three times a week   Frequency of Social Gatherings with Friends and Family: More than three times a week   Attends Religious Services: Never   Marine scientist or Organizations: No   Attends Music therapist: Never   Marital Status: Married    Tobacco Counseling Counseling given: Not Answered   Clinical Intake:  Pre-visit preparation completed: Yes  Pain : 0-10 Pain Score: 4  Pain Type: Chronic pain Pain Location: Groin Pain Orientation: Left Pain Descriptors / Indicators: Tender Pain Onset: 1 to 4 weeks ago Pain Frequency: Intermittent     BMI - recorded: 33.23 Nutritional Status: BMI > 30  Obese Nutritional Risks: None Diabetes: No     Diabetic? No         Activities of Daily Living In your present state of health, do you have any difficulty performing the following activities: 01/16/2021  Hearing? N  Vision? N  Difficulty concentrating or making decisions? N  Walking or climbing stairs? N  Dressing or bathing? N  Doing errands, shopping? N  Preparing Food and eating ? N  Using the Toilet? N  In the past six months, have you  accidently leaked urine? N  Do you have problems with loss of bowel control? N  Managing your Medications? N  Managing your Finances? N  Housekeeping or managing your Housekeeping? N  Some recent data might be hidden    Patient Care Team: Chevis Pretty, FNP as PCP - General (Family Medicine) Ilean China, RN as Registered Nurse  Indicate any recent Medical Services you may have received from other than [facility] providers in the past year (date may be approximate).     Assessment:   This is a routine wellness examination for Dayon.  Hearing/Vision screen No results found.  Dietary issues and exercise activities discussed: Current Exercise Habits: The patient does not participate in regular exercise at present, Exercise  limited by: orthopedic condition(s)   Goals Addressed             This Visit's Progress    DIET - EAT MORE FRUITS AND VEGETABLES   Not on track    Exercise 150 min/wk Moderate Activity   Not on track      Depression Screen PHQ 2/9 Scores 01/16/2021 01/14/2021 07/29/2020 05/07/2020 01/25/2020 01/11/2020 10/10/2019  PHQ - 2 Score 0 0 0 0 0 0 0  PHQ- 9 Score 4 4 - 0 0 - -    Fall Risk Fall Risk  01/14/2021 07/29/2020 05/07/2020 01/25/2020 01/11/2020  Falls in the past year? 0 0 0 0 0  Number falls in past yr: - - 0 0 -  Injury with Fall? 0 - 0 0 -  Follow up - - - Falls evaluation completed -    FALL RISK PREVENTION PERTAINING TO THE HOME:  Any stairs in or around the home? No  If so, are there any without handrails? No  Home free of loose throw rugs in walkways, pet beds, electrical cords, etc? Yes  Adequate lighting in your home to reduce risk of falls? Yes   ASSISTIVE DEVICES UTILIZED TO PREVENT FALLS:  Life alert? No  Use of a cane, walker or w/c? No  Grab bars in the bathroom? No  Shower chair or bench in shower? No  Elevated toilet seat or a handicapped toilet? No   TIMED UP AND GO:  Was the test performed? No . Telephonic visit  Cognitive  Function: Normal cognitive status assessed by direct observation by this Nurse Health Advisor. No abnormalities found.   MMSE - Mini Mental State Exam 03/08/2018  Not completed: Unable to complete     6CIT Screen 10/17/2019  What Year? 0 points  What month? 0 points  What time? 0 points  Count back from 20 0 points  Months in reverse 0 points  Repeat phrase 0 points  Total Score 0    Immunizations  There is no immunization history on file for this patient.  TDAP status: Due, Education has been provided regarding the importance of this vaccine. Advised may receive this vaccine at local pharmacy or Health Dept. Aware to provide a copy of the vaccination record if obtained from local pharmacy or Health Dept. Verbalized acceptance and understanding.  Flu Vaccine status: Due, Education has been provided regarding the importance of this vaccine. Advised may receive this vaccine at local pharmacy or Health Dept. Aware to provide a copy of the vaccination record if obtained from local pharmacy or Health Dept. Verbalized acceptance and understanding.  Pneumococcal vaccine status: Due, Education has been provided regarding the importance of this vaccine. Advised may receive this vaccine at local pharmacy or Health Dept. Aware to provide a copy of the vaccination record if obtained from local pharmacy or Health Dept. Verbalized acceptance and understanding.  Covid-19 vaccine status: Declined, Education has been provided regarding the importance of this vaccine but patient still declined. Advised may receive this vaccine at local pharmacy or Health Dept.or vaccine clinic. Aware to provide a copy of the vaccination record if obtained from local pharmacy or Health Dept. Verbalized acceptance and understanding.  Qualifies for Shingles Vaccine? Yes   Zostavax completed No   Shingrix Completed?: No.    Education has been provided regarding the importance of this vaccine. Patient has been advised to call  insurance company to determine out of pocket expense if they have not yet received this vaccine. Advised may  also receive vaccine at local pharmacy or Health Dept. Verbalized acceptance and understanding.  Screening Tests Health Maintenance  Topic Date Due   COVID-19 Vaccine (1) 01/30/2021 (Originally 03/14/1976)   Zoster Vaccines- Shingrix (1 of 2) 04/16/2021 (Originally 03/13/2020)   COLONOSCOPY (Pts 45-73yrs Insurance coverage will need to be confirmed)  07/29/2021 (Originally 03/14/2015)   INFLUENZA VACCINE  02/03/2021   TETANUS/TDAP  03/06/2021   Hepatitis C Screening  Completed   HIV Screening  Completed   Pneumococcal Vaccine 63-35 Years old  Aged Out   HPV VACCINES  Aged Out    Health Maintenance  There are no preventive care reminders to display for this patient.  Colorectal cancer screening: Referral to GI placed 12/2020. Pt aware the office will call re: appt.  Lung Cancer Screening: (Low Dose CT Chest recommended if Age 40-80 years, 30 pack-year currently smoking OR have quit w/in 15years.) does not qualify.   Additional Screening:  Hepatitis C Screening: does not qualify; Completed 08/11/2016  Vision Screening: Recommended annual ophthalmology exams for early detection of glaucoma and other disorders of the eye. Is the patient up to date with their annual eye exam?  No  Who is the provider or what is the name of the office in which the patient attends annual eye exams? [facility] If pt is not established with a provider, would they like to be referred to a provider to establish care? No .   Dental Screening: Recommended annual dental exams for proper oral hygiene  Community Resource Referral / Chronic Care Management: CRR required this visit?  No   CCM required this visit?  No      Plan:     I have personally reviewed and noted the following in the patient's chart:   Medical and social history Use of alcohol, tobacco or illicit drugs  Current medications and  supplements including opioid prescriptions. Patient is not currently taking opioid prescriptions. Functional ability and status Nutritional status Physical activity Advanced directives List of other physicians Hospitalizations, surgeries, and ER visits in previous 12 months Vitals Screenings to include cognitive, depression, and falls Referrals and appointments  In addition, I have reviewed and discussed with patient certain preventive protocols, quality metrics, and best practice recommendations. A written personalized care plan for preventive services as well as general preventive health recommendations were provided to patient.     Sandrea Hammond, LPN   3/79/0240   Nurse Notes: None

## 2021-01-16 NOTE — Patient Instructions (Signed)
Tyler Barton , Thank you for taking time to come for your Medicare Wellness Visit. I appreciate your ongoing commitment to your health goals. Please review the following plan we discussed and let me know if I can assist you in the future.   Screening recommendations/referrals: Colonoscopy: Due (ordered by Shelah Lewandowsky) Recommended yearly ophthalmology/optometry visit for glaucoma screening and checkup Recommended yearly dental visit for hygiene and checkup  Vaccinations: Influenza vaccine: Due every fall Pneumococcal vaccine: Due - 2 doses one year apart per lifetime Tdap vaccine: Done 03/07/2011 - Repeat in 10 years  Shingles vaccine: Due (2 doses 2-6 months apart, over 90% effective) Shingrix discussed. Please contact your pharmacy for coverage information.     Covid-19: Due  Advanced directives: Please bring a copy of your health care power of attorney and living will to the office to be added to your chart at your convenience.   Conditions/risks identified: Aim for 30 minutes of exercise or brisk walking each day, drink 6-8 glasses of water and eat lots of fruits and vegetables.   Next appointment: Follow up in one year for your annual wellness visit   Preventive Care 40-64 Years, Male Preventive care refers to lifestyle choices and visits with your health care provider that can promote health and wellness. What does preventive care include? A yearly physical exam. This is also called an annual well check. Dental exams once or twice a year. Routine eye exams. Ask your health care provider how often you should have your eyes checked. Personal lifestyle choices, including: Daily care of your teeth and gums. Regular physical activity. Eating a healthy diet. Avoiding tobacco and drug use. Limiting alcohol use. Practicing safe sex. Taking low-dose aspirin every day starting at age 59. What happens during an annual well check? The services and screenings done by your health care  provider during your annual well check will depend on your age, overall health, lifestyle risk factors, and family history of disease. Counseling  Your health care provider may ask you questions about your: Alcohol use. Tobacco use. Drug use. Emotional well-being. Home and relationship well-being. Sexual activity. Eating habits. Work and work Statistician. Screening  You may have the following tests or measurements: Height, weight, and BMI. Blood pressure. Lipid and cholesterol levels. These may be checked every 5 years, or more frequently if you are over 63 years old. Skin check. Lung cancer screening. You may have this screening every year starting at age 12 if you have a 30-pack-year history of smoking and currently smoke or have quit within the past 15 years. Fecal occult blood test (FOBT) of the stool. You may have this test every year starting at age 24. Flexible sigmoidoscopy or colonoscopy. You may have a sigmoidoscopy every 5 years or a colonoscopy every 10 years starting at age 37. Prostate cancer screening. Recommendations will vary depending on your family history and other risks. Hepatitis C blood test. Hepatitis B blood test. Sexually transmitted disease (STD) testing. Diabetes screening. This is done by checking your blood sugar (glucose) after you have not eaten for a while (fasting). You may have this done every 1-3 years. Discuss your test results, treatment options, and if necessary, the need for more tests with your health care provider. Vaccines  Your health care provider may recommend certain vaccines, such as: Influenza vaccine. This is recommended every year. Tetanus, diphtheria, and acellular pertussis (Tdap, Td) vaccine. You may need a Td booster every 10 years. Zoster vaccine. You may need this after age 67.  Pneumococcal 13-valent conjugate (PCV13) vaccine. You may need this if you have certain conditions and have not been vaccinated. Pneumococcal  polysaccharide (PPSV23) vaccine. You may need one or two doses if you smoke cigarettes or if you have certain conditions. Talk to your health care provider about which screenings and vaccines you need and how often you need them. This information is not intended to replace advice given to you by your health care provider. Make sure you discuss any questions you have with your health care provider. Document Released: 07/19/2015 Document Revised: 03/11/2016 Document Reviewed: 04/23/2015 Elsevier Interactive Patient Education  2017 [place] Prevention in the Home Falls can cause injuries. They can happen to people of all ages. There are many things you can do to make your home safe and to help prevent falls. What can I do on the outside of my home? Regularly fix the edges of walkways and driveways and fix any cracks. Remove anything that might make you trip as you walk through a door, such as a raised step or threshold. Trim any bushes or trees on the path to your home. Use bright outdoor lighting. Clear any walking paths of anything that might make someone trip, such as rocks or tools. Regularly check to see if handrails are loose or broken. Make sure that both sides of any steps have handrails. Any raised decks and porches should have guardrails on the edges. Have any leaves, snow, or ice cleared regularly. Use sand or salt on walking paths during winter. Clean up any spills in your garage right away. This includes oil or grease spills. What can I do in the bathroom? Use night lights. Install grab bars by the toilet and in the tub and shower. Do not use towel bars as grab bars. Use non-skid mats or decals in the tub or shower. If you need to sit down in the shower, use a plastic, non-slip stool. Keep the floor dry. Clean up any water that spills on the floor as soon as it happens. Remove soap buildup in the tub or shower regularly. Attach bath mats securely with double-sided  non-slip rug tape. Do not have throw rugs and other things on the floor that can make you trip. What can I do in the bedroom? Use night lights. Make sure that you have a light by your bed that is easy to reach. Do not use any sheets or blankets that are too big for your bed. They should not hang down onto the floor. Have a firm chair that has side arms. You can use this for support while you get dressed. Do not have throw rugs and other things on the floor that can make you trip. What can I do in the kitchen? Clean up any spills right away. Avoid walking on wet floors. Keep items that you use a lot in easy-to-reach places. If you need to reach something above you, use a strong step stool that has a grab bar. Keep electrical cords out of the way. Do not use floor polish or wax that makes floors slippery. If you must use wax, use non-skid floor wax. Do not have throw rugs and other things on the floor that can make you trip. What can I do with my stairs? Do not leave any items on the stairs. Make sure that there are handrails on both sides of the stairs and use them. Fix handrails that are broken or loose. Make sure that handrails are as long as the  stairways. Check any carpeting to make sure that it is firmly attached to the stairs. Fix any carpet that is loose or worn. Avoid having throw rugs at the top or bottom of the stairs. If you do have throw rugs, attach them to the floor with carpet tape. Make sure that you have a light switch at the top of the stairs and the bottom of the stairs. If you do not have them, ask someone to add them for you. What else can I do to help prevent falls? Wear shoes that: Do not have high heels. Have rubber bottoms. Are comfortable and fit you well. Are closed at the toe. Do not wear sandals. If you use a stepladder: Make sure that it is fully opened. Do not climb a closed stepladder. Make sure that both sides of the stepladder are locked into place. Ask  someone to hold it for you, if possible. Clearly mark and make sure that you can see: Any grab bars or handrails. First and last steps. Where the edge of each step is. Use tools that help you move around (mobility aids) if they are needed. These include: Canes. Walkers. Scooters. Crutches. Turn on the lights when you go into a dark area. Replace any light bulbs as soon as they burn out. Set up your furniture so you have a clear path. Avoid moving your furniture around. If any of your floors are uneven, fix them. If there are any pets around you, be aware of where they are. Review your medicines with your doctor. Some medicines can make you feel dizzy. This can increase your chance of falling. Ask your doctor what other things that you can do to help prevent falls. This information is not intended to replace advice given to you by your health care provider. Make sure you discuss any questions you have with your health care provider. Document Released: 04/18/2009 Document Revised: 11/28/2015 Document Reviewed: 07/27/2014 Elsevier Interactive Patient Education  2017 [place].   Cholesterol Content in Foods Cholesterol is a waxy, fat-like substance that helps to carry fat in the blood. The body needs cholesterol in small amounts, but too much cholesterol can causedamage to the arteries and heart. Most people should eat less than 200 milligrams (mg) of cholesterol a day. Foods with cholesterol  Cholesterol is found in animal-based foods, such as meat, seafood, and dairy. Generally, low-fat dairy and lean meats have less cholesterol than full-fat dairy and fatty meats. The milligrams of cholesterol per serving (mg per serving) of common cholesterol-containing foods are listed below. Meat and other proteins Egg -- one large whole egg has 186 mg. Veal shank -- 4 oz has 141 mg. Lean ground [place] (93% lean) -- 4 oz has 118 mg. Fat-trimmed lamb loin -- 4 oz has 106 mg. Lean ground beef (90%  lean) -- 4 oz has 100 mg. Lobster -- 3.5 oz has 90 mg. Pork loin chops -- 4 oz has 86 mg. Canned salmon -- 3.5 oz has 83 mg. Fat-trimmed beef top loin -- 4 oz has 78 mg. Frankfurter -- 1 frank (3.5 oz) has 77 mg. Crab -- 3.5 oz has 71 mg. Roasted chicken without skin, white meat -- 4 oz has 66 mg. Light bologna -- 2 oz has 45 mg. Deli-cut [place] -- 2 oz has 31 mg. Canned tuna -- 3.5 oz has 31 mg. Berniece Salines -- 1 oz has 29 mg. Oysters and mussels (raw) -- 3.5 oz has 25 mg. Mackerel -- 1 oz has 22 mg.  Trout -- 1 oz has 20 mg. Pork sausage -- 1 link (1 oz) has 17 mg. Salmon -- 1 oz has 16 mg. Tilapia -- 1 oz has 14 mg. Dairy Soft-serve ice cream --  cup (4 oz) has 103 mg. Whole-milk yogurt -- 1 cup (8 oz) has 29 mg. Cheddar cheese -- 1 oz has 28 mg. American cheese -- 1 oz has 28 mg. Whole milk -- 1 cup (8 oz) has 23 mg. 2% milk -- 1 cup (8 oz) has 18 mg. Cream cheese -- 1 tablespoon (Tbsp) has 15 mg. Cottage cheese --  cup (4 oz) has 14 mg. Low-fat (1%) milk -- 1 cup (8 oz) has 10 mg. Sour cream -- 1 Tbsp has 8.5 mg. Low-fat yogurt -- 1 cup (8 oz) has 8 mg. Nonfat Greek yogurt -- 1 cup (8 oz) has 7 mg. Half-and-half cream -- 1 Tbsp has 5 mg. Fats and oils Cod liver oil -- 1 tablespoon (Tbsp) has 82 mg. Butter -- 1 Tbsp has 15 mg. Lard -- 1 Tbsp has 14 mg. Bacon grease -- 1 Tbsp has 14 mg. Mayonnaise -- 1 Tbsp has 5-10 mg. Margarine -- 1 Tbsp has 3-10 mg. Exact amounts of cholesterol in these foods may vary depending on specificingredients and brands. Foods without cholesterol Most plant-based foods do not have cholesterol unless you combine them with a food that has cholesterol. Foods without cholesterol include: Grains and cereals. Vegetables. Fruits. Vegetable oils, such as olive, canola, and sunflower oil. Legumes, such as peas, beans, and lentils. Nuts and seeds. Egg whites. Summary The body needs cholesterol in small amounts, but too much cholesterol can cause damage  to the arteries and heart. Most people should eat less than 200 milligrams (mg) of cholesterol a day. This information is not intended to replace advice given to you by your health care provider. Make sure you discuss any questions you have with your healthcare provider. Document Revised: 10/03/2019 Document Reviewed: 11/13/2019 Elsevier Patient Education  [place].

## 2021-01-28 ENCOUNTER — Ambulatory Visit: Payer: Medicare Other | Admitting: Nurse Practitioner

## 2021-02-11 ENCOUNTER — Encounter: Payer: Self-pay | Admitting: Gastroenterology

## 2021-03-09 ENCOUNTER — Other Ambulatory Visit: Payer: Self-pay | Admitting: Nurse Practitioner

## 2021-03-09 DIAGNOSIS — J0100 Acute maxillary sinusitis, unspecified: Secondary | ICD-10-CM

## 2021-03-11 ENCOUNTER — Telehealth: Payer: Self-pay | Admitting: Nurse Practitioner

## 2021-03-11 ENCOUNTER — Other Ambulatory Visit: Payer: Self-pay | Admitting: Nurse Practitioner

## 2021-03-11 ENCOUNTER — Other Ambulatory Visit: Payer: Self-pay

## 2021-03-11 DIAGNOSIS — J0100 Acute maxillary sinusitis, unspecified: Secondary | ICD-10-CM

## 2021-03-11 MED ORDER — LORATADINE 10 MG PO TABS: 10.0000 mg | ORAL_TABLET | Freq: Every day | ORAL | 5 refills | Status: DC

## 2021-03-11 MED ORDER — LORATADINE 10 MG PO TABS: 10.0000 mg | ORAL_TABLET | Freq: Every day | ORAL | 5 refills | Status: AC

## 2021-03-11 NOTE — Telephone Encounter (Signed)
Question about rx EQ ALLERGY RELIEF 10 MG tablet--use Loratadine or sertraline Please call back

## 2021-03-19 ENCOUNTER — Encounter: Payer: Self-pay | Admitting: Nurse Practitioner

## 2021-03-19 ENCOUNTER — Ambulatory Visit (INDEPENDENT_AMBULATORY_CARE_PROVIDER_SITE_OTHER): Payer: Medicare Other | Admitting: Nurse Practitioner

## 2021-03-19 VITALS — Temp 100.3°F

## 2021-03-19 DIAGNOSIS — U071 COVID-19: Secondary | ICD-10-CM

## 2021-03-19 MED ORDER — AZITHROMYCIN 250 MG PO TABS: ORAL_TABLET | ORAL | 0 refills | Status: AC

## 2021-03-19 MED ORDER — PREDNISONE 10 MG (21) PO TBPK: ORAL_TABLET | ORAL | 0 refills | Status: DC

## 2021-03-19 NOTE — Progress Notes (Signed)
   Virtual Visit  Note Due to COVID-19 pandemic this visit was conducted virtually. This visit type was conducted due to national recommendations for restrictions regarding the COVID-19 Pandemic (e.g. social distancing, sheltering in place) in an effort to limit this patient's exposure and mitigate transmission in our community. All issues noted in this document were discussed and addressed.  A physical exam was not performed with this format.  I connected with Tyler Barton on 03/19/21 at 8:13 AM by telephone and verified that I am speaking with the correct person using two identifiers. Tyler Barton is currently located at home during visit. The provider, Ivy Lynn, NP is located in their office at time of visit.  I discussed the limitations, risks, security and privacy concerns of performing an evaluation and management service by telephone and the availability of in person appointments. I also discussed with the patient that there may be a patient responsible charge related to this service. The patient expressed understanding and agreed to proceed.   History and Present Illness:  HPI  Patient positive for COVID-19 in the last 24 hours with symptoms of headache, congestion, loss of taste, sore throat and temperatures of 100.3.  He denies ear ache, cough, nausea or vomiting.  Review of Systems  Constitutional:  Positive for fever.  HENT:  Positive for congestion.   Respiratory:  Negative for cough, shortness of breath and wheezing.   Cardiovascular: Negative.   Skin:  Negative for rash.  All other systems reviewed and are negative.   Observations/Objective: Televisit patient not in distress.  Assessment and Plan: Patient tested positive for COVID-19 yesterday.  Symptoms of congestion, headache, loss of taste, sore throat and temperature 100.3.  Denies ear ache, cough, nausea or vomiting.  Patient does not want antiviral at this time and would prefer to be treated exactly how he  was treated last year when he was COVID-positive.  Patient requested for Z-Pak and a prednisone taper.  Rx completed.  Education provided to patient.  Patient verbalized understanding.  Encourage patient to increase hydration and monitor oxygen saturation.  Follow Up Instructions: Follow-up with worsening unresolved symptoms    I discussed the assessment and treatment plan with the patient. The patient was provided an opportunity to ask questions and all were answered. The patient agreed with the plan and demonstrated an understanding of the instructions.   The patient was advised to call back or seek an in-person evaluation if the symptoms worsen or if the condition fails to improve as anticipated.  The above assessment and management plan was discussed with the patient. The patient verbalized understanding of and has agreed to the management plan. Patient is aware to call the clinic if symptoms persist or worsen. Patient is aware when to return to the clinic for a follow-up visit. Patient educated on when it is appropriate to go to the emergency department.   Time call ended: 8:26 AM  I provided 10 minutes of  non face-to-face time during this encounter.    Ivy Lynn, NP

## 2021-03-19 NOTE — Assessment & Plan Note (Signed)
Patient tested positive for COVID-19 yesterday.  Symptoms of congestion, headache, loss of taste, sore throat and temperature 100.3.  Denies ear ache, cough, nausea or vomiting.  Patient does not want antiviral at this time and would prefer to be treated exactly how he was treated last year when he was COVID-positive.  Patient requested for Z-Pak and a prednisone taper.  Rx completed.  Education provided to patient.  Patient verbalized understanding.  Encourage patient to increase hydration and monitor oxygen saturation.

## 2021-03-25 ENCOUNTER — Telehealth: Payer: Self-pay | Admitting: Nurse Practitioner

## 2021-03-25 NOTE — Telephone Encounter (Signed)
Used all antibiotic and steroid - and now feeling:  Very achy No breathing issues Slight congestion still (on mucinex) Very weak and sluggish  No cough Aware that it sounds like it is all running its course.   No fever   Aware to let us know if things turn for the worse Aware to cont using otc meds.

## 2021-03-26 ENCOUNTER — Ambulatory Visit: Payer: Medicare Other | Admitting: Gastroenterology

## 2021-04-22 ENCOUNTER — Ambulatory Visit: Payer: Medicare Other | Admitting: Gastroenterology

## 2021-04-28 ENCOUNTER — Encounter: Payer: Self-pay | Admitting: Nurse Practitioner

## 2021-06-09 ENCOUNTER — Encounter: Payer: Self-pay | Admitting: Nurse Practitioner

## 2021-06-09 ENCOUNTER — Ambulatory Visit (INDEPENDENT_AMBULATORY_CARE_PROVIDER_SITE_OTHER): Payer: Medicare Other | Admitting: Nurse Practitioner

## 2021-06-09 VITALS — BP 154/82 | HR 87 | Temp 97.9°F | Resp 20 | Ht 69.0 in | Wt 234.0 lb

## 2021-06-09 DIAGNOSIS — F419 Anxiety disorder, unspecified: Secondary | ICD-10-CM | POA: Diagnosis not present

## 2021-06-09 DIAGNOSIS — N521 Erectile dysfunction due to diseases classified elsewhere: Secondary | ICD-10-CM | POA: Diagnosis not present

## 2021-06-09 DIAGNOSIS — I10 Essential (primary) hypertension: Secondary | ICD-10-CM | POA: Diagnosis not present

## 2021-06-09 MED ORDER — LISINOPRIL 10 MG PO TABS: 10.0000 mg | ORAL_TABLET | Freq: Every day | ORAL | 1 refills | Status: DC

## 2021-06-09 MED ORDER — SILDENAFIL CITRATE 50 MG PO TABS: 50.0000 mg | ORAL_TABLET | Freq: Every day | ORAL | 0 refills | Status: DC | PRN

## 2021-06-09 NOTE — Progress Notes (Signed)
Subjective:    Patient ID: Tyler Barton, male    DOB: October 31, 1970, 50 y.o.   MRN: [id number]   Chief Complaint: Fatigue and Over eating   HPI Patient is in today with several complaints: -Patient is over eating due to his stress. He has been having problems in his family and that has increased his anxiety.  -fatigue- but does not sleep well due to his anxiety. He worries all the time and the least little thing worries him.  - having erectile dysfunction- he thinks that his anxiety keeps him from performing.  GAD 7 : Generalized Anxiety Score 06/09/2021 01/25/2020 10/10/2019 07/05/2019  Nervous, Anxious, on Edge 3 2 2 1   Control/stop worrying 2 2 3 2   Worry too much - different things 2 2 3 2   Trouble relaxing 0 1 2 1   Restless 0 0 0 0  Easily annoyed or irritable 1 0 1 1  Afraid - awful might happen 0 2 0 1  Total GAD 7 Score 8 9 11 8   Anxiety Difficulty Very difficult Somewhat difficult Not difficult at all Somewhat difficult  Some encounter information is confidential and restricted. Go to Review Flowsheets activity to see all data.    BP Readings from Last 3 Encounters:  06/09/21 (!) 154/82  01/14/21 133/90  07/29/20 134/90      Review of Systems  Constitutional:  Negative for diaphoresis.  Eyes:  Negative for pain.  Respiratory:  Negative for shortness of breath.   Cardiovascular:  Negative for chest pain, palpitations and leg swelling.  Gastrointestinal:  Negative for abdominal pain.  Endocrine: Negative for polydipsia.  Skin:  Negative for rash.  Neurological:  Negative for dizziness, weakness and headaches.  Hematological:  Does not bruise/bleed easily.  All other systems reviewed and are negative.     Objective:   Physical Exam Vitals and nursing note reviewed.  Constitutional:      Appearance: Normal appearance. He is well-developed.  HENT:     Head: Normocephalic.     Nose: Nose normal.  Eyes:     Pupils: Pupils are equal, round, and reactive to light.   Neck:     Thyroid: No thyroid mass or thyromegaly.     Vascular: No carotid bruit or JVD.     Trachea: Phonation normal.  Cardiovascular:     Rate and Rhythm: Normal rate and regular rhythm.  Pulmonary:     Effort: Pulmonary effort is normal. No respiratory distress.     Breath sounds: Normal breath sounds.  Abdominal:     General: Bowel sounds are normal.     Palpations: Abdomen is soft.     Tenderness: There is no abdominal tenderness.  Musculoskeletal:        General: Normal range of motion.     Cervical back: Normal range of motion and neck supple.  Lymphadenopathy:     Cervical: No cervical adenopathy.  Skin:    General: Skin is warm and dry.  Neurological:     Mental Status: He is alert and oriented to person, place, and time.  Psychiatric:        Behavior: Behavior normal.        Thought Content: Thought content normal.        Judgment: Judgment normal.   BP (!) 151/98   Pulse 87   Temp 97.9 F (36.6 C) (Temporal)   Resp 20   Ht 5\' 9"  (1.753 m)   Wt 234 lb (106.1 kg)   SpO2  97%   BMI 34.56 kg/m         Assessment & Plan:   [name] in today with chief complaint of Fatigue and Over eating   1. Erectile dysfunction due to diseases classified elsewhere Viagra given- side effects reviewed - sildenafil (VIAGRA) 50 MG tablet; Take 1 tablet (50 mg total) by mouth daily as needed for erectile dysfunction.  Dispense: 10 tablet; Refill: 0  2. Primary hypertension Low sodium det - lisinopril (ZESTRIL) 10 MG tablet; Take 1 tablet (10 mg total) by mouth daily.  Dispense: 90 tablet; Refill: 1  3. Anxiety Stress management Continue valium and wellbutrin    The above assessment and management plan was discussed with the patient. The patient verbalized understanding of and has agreed to the management plan. Patient is aware to call the clinic if symptoms persist or worsen. Patient is aware when to return to the clinic for a follow-up visit. Patient educated  on when it is appropriate to go to the emergency department.   Mary-Margaret Hassell Done, FNP

## 2021-06-09 NOTE — Patient Instructions (Signed)

## 2021-07-10 ENCOUNTER — Other Ambulatory Visit: Payer: Self-pay | Admitting: Nurse Practitioner

## 2021-07-10 DIAGNOSIS — F3342 Major depressive disorder, recurrent, in full remission: Secondary | ICD-10-CM

## 2021-07-10 DIAGNOSIS — F419 Anxiety disorder, unspecified: Secondary | ICD-10-CM

## 2021-07-18 ENCOUNTER — Encounter: Payer: Self-pay | Admitting: Nurse Practitioner

## 2021-07-18 ENCOUNTER — Ambulatory Visit (INDEPENDENT_AMBULATORY_CARE_PROVIDER_SITE_OTHER): Payer: Medicare Other | Admitting: Nurse Practitioner

## 2021-07-18 VITALS — BP 125/85 | HR 79 | Temp 97.9°F | Resp 20 | Ht 69.0 in | Wt 228.0 lb

## 2021-07-18 DIAGNOSIS — K582 Mixed irritable bowel syndrome: Secondary | ICD-10-CM

## 2021-07-18 DIAGNOSIS — I10 Essential (primary) hypertension: Secondary | ICD-10-CM | POA: Diagnosis not present

## 2021-07-18 DIAGNOSIS — F3342 Major depressive disorder, recurrent, in full remission: Secondary | ICD-10-CM

## 2021-07-18 DIAGNOSIS — F419 Anxiety disorder, unspecified: Secondary | ICD-10-CM | POA: Diagnosis not present

## 2021-07-18 DIAGNOSIS — N521 Erectile dysfunction due to diseases classified elsewhere: Secondary | ICD-10-CM

## 2021-07-18 DIAGNOSIS — F410 Panic disorder [episodic paroxysmal anxiety] without agoraphobia: Secondary | ICD-10-CM | POA: Diagnosis not present

## 2021-07-18 MED ORDER — DIAZEPAM 5 MG PO TABS: 5.0000 mg | ORAL_TABLET | Freq: Two times a day (BID) | ORAL | 5 refills | Status: DC | PRN

## 2021-07-18 MED ORDER — SILDENAFIL CITRATE 50 MG PO TABS: 50.0000 mg | ORAL_TABLET | Freq: Every day | ORAL | 11 refills | Status: DC | PRN

## 2021-07-18 MED ORDER — BUPROPION HCL ER (XL) 150 MG PO TB24: 150.0000 mg | ORAL_TABLET | Freq: Every day | ORAL | 1 refills | Status: DC

## 2021-07-18 MED ORDER — FAMOTIDINE 20 MG PO TABS: 20.0000 mg | ORAL_TABLET | Freq: Two times a day (BID) | ORAL | 2 refills | Status: DC

## 2021-07-18 MED ORDER — LISINOPRIL 10 MG PO TABS: 10.0000 mg | ORAL_TABLET | Freq: Every day | ORAL | 1 refills | Status: DC

## 2021-07-18 NOTE — Progress Notes (Signed)
Subjective:    Patient ID: Tyler Barton, male    DOB: February 03, 1971, 51 y.o.   MRN: [id number]   Chief Complaint: medical management of chronic issues     HPI:  Tyler Barton is a 51 y.o. who identifies as a male who was assigned male at birth.   Social history: Lives with: wife Work history: disability for panic disorder   Comes in today for follow up of the following chronic medical issues:  1. Irritable bowel syndrome with both constipation and diarrhea He alternates from constipation and diarrhea. He says he knows what to do when he has a flare up of either. Uses OTC meds when needed  2. Anxiety 3. Panic disorder Patient stays very anxious all the tme. It leads to full blown panic attacks where he feels ike he cannot breathe. He was seen on 06/09/21 and at that time panic attacks had been occurring frequently. He was starting  to have erectile dysfunction caused by the anxiety. He takes valium BID and that seems to help most of the time. GAD 7 : Generalized Anxiety Score 07/18/2021 06/09/2021 01/25/2020 10/10/2019  Nervous, Anxious, on Edge 1 3 2 2   Control/stop worrying 1 2 2 3   Worry too much - different things 1 2 2 3   Trouble relaxing 0 0 1 2  Restless 0 0 0 0  Easily annoyed or irritable 1 1 0 1  Afraid - awful might happen 0 0 2 0  Total GAD 7 Score 4 8 9 11   Anxiety Difficulty Somewhat difficult Very difficult Somewhat difficult Not difficult at all  Some encounter information is confidential and restricted. Go to Review Flowsheets activity to see all data.      4. Recurrent major depressive disorder, in full remission [facility]) He is on wellbutirn and feels that that helps with depression. Depression screen [facility] 2/9 07/18/2021 06/09/2021 01/16/2021  Decreased Interest 0 1 0  Down, Depressed, Hopeless 0 0 0  PHQ - 2 Score 0 1 0  Altered sleeping 1 0 0  Tired, decreased energy 1 3 2   Change in appetite 1 3 2   Feeling bad or failure about yourself  0 0 0  Trouble  concentrating 1 1 0  Moving slowly or fidgety/restless 0 0 0  Suicidal thoughts 0 0 0  PHQ-9 Score 4 8 4   Difficult doing work/chores Somewhat difficult Somewhat difficult Somewhat difficult  Some recent data might be hidden     5. GERD Is on pepcid when he has symptoms . He does not take everyday.  6. Morbid obesity ([place]) Weight is down 6lbs Wt Readings from Last 3 Encounters:  07/18/21 228 lb (103.4 kg)  06/09/21 234 lb (106.1 kg)  01/16/21 229 lb (103.9 kg)   BMI Readings from Last 3 Encounters:  07/18/21 33.67 kg/m  06/09/21 34.56 kg/m  01/16/21 33.82 kg/m     New complaints: None today  No Known Allergies Outpatient Encounter Medications as of 07/18/2021  Medication Sig   buPROPion (WELLBUTRIN XL) 150 MG 24 hr tablet Take 1 tablet by mouth once daily   diazepam (VALIUM) 5 MG tablet Take 1 tablet (5 mg total) by mouth every 12 (twelve) hours as needed. for anxiety   famotidine (PEPCID) 20 MG tablet Take 1 tablet (20 mg total) by mouth 2 (two) times daily.   fluticasone (FLONASE) 50 MCG/ACT nasal spray Place 2 sprays into both nostrils daily.   lisinopril (ZESTRIL) 10 MG tablet Take 1 tablet (10 mg  total) by mouth daily.   loratadine (EQ ALLERGY RELIEF) 10 MG tablet Take 1 tablet (10 mg total) by mouth daily.   sildenafil (VIAGRA) 50 MG tablet Take 1 tablet (50 mg total) by mouth daily as needed for erectile dysfunction.   No facility-administered encounter medications on file as of 07/18/2021.    Past Surgical History:  Procedure Laterality Date   KNEE ARTHROSCOPY Left 1993   LIPOMA EXCISION      Family History  Problem Relation Age of Onset   Anxiety disorder Mother    Anxiety disorder Father    Heart disease Father 61       with stent placement and bypass   Anxiety disorder Sister    Cancer Maternal Uncle        throat   Alzheimer's disease Maternal Grandmother    Cancer Paternal Grandmother    Heart disease Maternal Uncle    Heart disease Maternal  Uncle       Controlled substance contract: n/a     Review of Systems  Constitutional:  Negative for diaphoresis.  Eyes:  Negative for pain.  Respiratory:  Negative for shortness of breath.   Cardiovascular:  Negative for chest pain, palpitations and leg swelling.  Gastrointestinal:  Negative for abdominal pain.  Endocrine: Negative for polydipsia.  Skin:  Negative for rash.  Neurological:  Negative for dizziness, weakness and headaches.  Hematological:  Does not bruise/bleed easily.  Psychiatric/Behavioral:  The patient is nervous/anxious.   All other systems reviewed and are negative.     Objective:   Physical Exam Vitals and nursing note reviewed.  Constitutional:      Appearance: Normal appearance. He is well-developed.  HENT:     Head: Normocephalic.     Nose: Nose normal.     Mouth/Throat:     Mouth: Mucous membranes are moist.     Pharynx: Oropharynx is clear.  Eyes:     Pupils: Pupils are equal, round, and reactive to light.  Neck:     Thyroid: No thyroid mass or thyromegaly.     Vascular: No carotid bruit or JVD.     Trachea: Phonation normal.  Cardiovascular:     Rate and Rhythm: Normal rate and regular rhythm.  Pulmonary:     Effort: Pulmonary effort is normal. No respiratory distress.     Breath sounds: Normal breath sounds.  Abdominal:     General: Bowel sounds are normal.     Palpations: Abdomen is soft.     Tenderness: There is no abdominal tenderness.  Musculoskeletal:        General: Normal range of motion.     Cervical back: Normal range of motion and neck supple.  Lymphadenopathy:     Cervical: No cervical adenopathy.  Skin:    General: Skin is warm and dry.  Neurological:     Mental Status: He is alert and oriented to person, place, and time.  Psychiatric:        Behavior: Behavior normal.        Thought Content: Thought content normal.        Judgment: Judgment normal.    BP 125/85    Pulse 79    Temp 97.9 F (36.6 C) (Temporal)     Resp 20    Ht 5\' 9"  (1.753 m)    Wt 228 lb (103.4 kg)    SpO2 95%    BMI 33.67 kg/m        Assessment & Plan:   Tyler Brow  R Barton comes in today with chief complaint of Medical Management of Chronic Issues   Diagnosis and orders addressed:  1. Irritable bowel syndrome with both constipation and diarrhea Watch diet - famotidine (PEPCID) 20 MG tablet; Take 1 tablet (20 mg total) by mouth 2 (two) times daily.  Dispense: 60 tablet; Refill: 2  2. Anxiety Stress management - buPROPion (WELLBUTRIN XL) 150 MG 24 hr tablet; Take 1 tablet (150 mg total) by mouth daily.  Dispense: 90 tablet; Refill: 1  3. Panic disorder   4. Recurrent major depressive disorder, in full remission ([place]) - buPROPion (WELLBUTRIN XL) 150 MG 24 hr tablet; Take 1 tablet (150 mg total) by mouth daily.  Dispense: 90 tablet; Refill: 1  5. Morbid obesity ([place]) Going to start exercise routine - diazepam (VALIUM) 5 MG tablet; Take 1 tablet (5 mg total) by mouth every 12 (twelve) hours as needed. for anxiety  Dispense: 60 tablet; Refill: 5  6. Primary hypertension Low sodium diet - lisinopril (ZESTRIL) 10 MG tablet; Take 1 tablet (10 mg total) by mouth daily.  Dispense: 90 tablet; Refill: 1  7. Erectile dysfunction due to diseases classified elsewhere - sildenafil (VIAGRA) 50 MG tablet; Take 1 tablet (50 mg total) by mouth daily as needed for erectile dysfunction.  Dispense: 10 tablet; Refill: 11   Wants to wait 3 months for  labs Health Maintenance reviewed Diet and exercise encouraged  Follow up plan: 3 months   Mary-Margaret Hassell Done, FNP

## 2021-07-18 NOTE — Patient Instructions (Signed)

## 2021-10-21 ENCOUNTER — Encounter: Payer: Self-pay | Admitting: Nurse Practitioner

## 2021-10-21 ENCOUNTER — Ambulatory Visit (INDEPENDENT_AMBULATORY_CARE_PROVIDER_SITE_OTHER): Payer: Medicare Other | Admitting: Nurse Practitioner

## 2021-10-21 VITALS — BP 124/86 | HR 82 | Temp 97.8°F | Resp 20 | Ht 69.0 in | Wt 225.0 lb

## 2021-10-21 DIAGNOSIS — I10 Essential (primary) hypertension: Secondary | ICD-10-CM | POA: Diagnosis not present

## 2021-10-21 DIAGNOSIS — F419 Anxiety disorder, unspecified: Secondary | ICD-10-CM

## 2021-10-21 DIAGNOSIS — Z1211 Encounter for screening for malignant neoplasm of colon: Secondary | ICD-10-CM

## 2021-10-21 DIAGNOSIS — F3342 Major depressive disorder, recurrent, in full remission: Secondary | ICD-10-CM

## 2021-10-21 DIAGNOSIS — F410 Panic disorder [episodic paroxysmal anxiety] without agoraphobia: Secondary | ICD-10-CM | POA: Diagnosis not present

## 2021-10-21 DIAGNOSIS — Z6833 Body mass index (BMI) 33.0-33.9, adult: Secondary | ICD-10-CM

## 2021-10-21 DIAGNOSIS — K582 Mixed irritable bowel syndrome: Secondary | ICD-10-CM | POA: Diagnosis not present

## 2021-10-21 NOTE — Progress Notes (Signed)
? ?Subjective:  ? ? Patient ID: Tyler Barton, male    DOB: March 14, 1971, 51 y.o.   MRN: [id number] ? ? ?Chief Complaint: medical management of chronic issues  ?  ? ?HPI: ? ?Tyler Barton is a 51 y.o. who identifies as a male who was assigned male at birth.  ? 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 ? ?Comes in today for follow up of the following chronic medical issues: ? ?1. Primary hypertension ?No c/o chest pain, sob or headache. Does not check blood pressure at home. ?BP Readings from Last 3 Encounters:  ?07/18/21 125/85  ?06/09/21 (!) 154/82  ?01/14/21 133/90  ? ? ? ?2. Irritable bowel syndrome with both constipation and diarrhea ?Has occasional constipation but is tolerable. Takes a daily stool softner ? ?3. Panic disorder ?Is on valium BID-- has panic attacks without medication ? ?4. Recurrent major depressive disorder, in full remission ([place]) ?Is on wellbutrin and seems to be doing well. He has good days and bad days. ? ?  10/21/2021  ?  8:03 AM 07/18/2021  ?  8:07 AM 06/09/2021  ? 12:11 PM  ?Depression screen PHQ 2/9  ?Decreased Interest 2 0 1  ?Down, Depressed, Hopeless 0 0 0  ?PHQ - 2 Score 2 0 1  ?Altered sleeping 0 1 0  ?Tired, decreased energy '2 1 3  '$ ?Change in appetite '2 1 3  '$ ?Feeling bad or failure about yourself  0 0 0  ?Trouble concentrating '1 1 1  '$ ?Moving slowly or fidgety/restless 0 0 0  ?Suicidal thoughts 0 0 0  ?PHQ-9 Score '7 4 8  '$ ?Difficult doing work/chores Somewhat difficult Somewhat difficult Somewhat difficult  ? ? ? ? ?5. Anxiety ?Again is on valium and is doing well. Has good days and bad days. ? ?  10/21/2021  ?  8:03 AM 07/18/2021  ?  8:08 AM 06/09/2021  ? 12:11 PM 01/25/2020  ?  3:16 PM  ?GAD 7 : Generalized Anxiety Score  ?Nervous, Anxious, on Edge '2 1 3 2  '$ ?Control/stop worrying '3 1 2 2  '$ ?Worry too much - different things '2 1 2 2  '$ ?Trouble relaxing 2 0 0 1  ?Restless 1 0 0 0  ?Easily annoyed or irritable '1 1 1 '$ 0  ?Afraid - awful might  happen 1 0 0 2  ?Total GAD 7 Score '12 4 8 9  '$ ?Anxiety Difficulty Somewhat difficult Somewhat difficult Very difficult Somewhat difficult  ? ? ? ? ?6. Morbid obesity ([place]) ?Weight is down 3 lbs. ?Wt Readings from Last 3 Encounters:  ?10/21/21 225 lb (102.1 kg)  ?07/18/21 228 lb (103.4 kg)  ?06/09/21 234 lb (106.1 kg)  ? ?BMI Readings from Last 3 Encounters:  ?10/21/21 33.23 kg/m?  ?07/18/21 33.67 kg/m?  ?06/09/21 34.56 kg/m?  ? ? ? ?New complaints: ?None today ? ?No Known Allergies ?Outpatient Encounter Medications as of 10/21/2021  ?Medication Sig  ? buPROPion (WELLBUTRIN XL) 150 MG 24 hr tablet Take 1 tablet (150 mg total) by mouth daily.  ? diazepam (VALIUM) 5 MG tablet Take 1 tablet (5 mg total) by mouth every 12 (twelve) hours as needed. for anxiety  ? famotidine (PEPCID) 20 MG tablet Take 1 tablet (20 mg total) by mouth 2 (two) times daily.  ? fluticasone (FLONASE) 50 MCG/ACT nasal spray Place 2 sprays into both nostrils daily.  ? lisinopril (ZESTRIL) 10 MG tablet Take 1 tablet (10 mg total) by  mouth daily.  ? loratadine (EQ ALLERGY RELIEF) 10 MG tablet Take 1 tablet (10 mg total) by mouth daily.  ? sildenafil (VIAGRA) 50 MG tablet Take 1 tablet (50 mg total) by mouth daily as needed for erectile dysfunction.  ? ?No facility-administered encounter medications on file as of 10/21/2021.  ? ? ?Past Surgical History:  ?Procedure Laterality Date  ? KNEE ARTHROSCOPY Left 1993  ? LIPOMA EXCISION    ? ? ?Family History  ?Problem Relation Age of Onset  ? Anxiety disorder Mother   ? Anxiety disorder Father   ? Heart disease Father 61  ?     with stent placement and bypass  ? Anxiety disorder Sister   ? Cancer Maternal Uncle   ?     throat  ? Alzheimer's disease Maternal Grandmother   ? Cancer Paternal Grandmother   ? Heart disease Maternal Uncle   ? Heart disease Maternal Uncle   ? ? ? ? ?Controlled substance contract: 10/21/20- drug screen today ? ? ? ?Review of Systems  ?Constitutional:  Negative for diaphoresis.  ?Eyes:   Negative for pain.  ?Respiratory:  Negative for shortness of breath.   ?Cardiovascular:  Negative for chest pain, palpitations and leg swelling.  ?Gastrointestinal:  Negative for abdominal pain.  ?Endocrine: Negative for polydipsia.  ?Skin:  Negative for rash.  ?Neurological:  Negative for dizziness, weakness and headaches.  ?Hematological:  Does not bruise/bleed easily.  ?All other systems reviewed and are negative. ? ?   ?Objective:  ? Physical Exam ?Vitals and nursing note reviewed.  ?Constitutional:   ?   Appearance: Normal appearance. He is well-developed.  ?HENT:  ?   Head: Normocephalic.  ?   Nose: Nose normal.  ?   Mouth/Throat:  ?   Mouth: Mucous membranes are moist.  ?   Pharynx: Oropharynx is clear.  ?Eyes:  ?   Pupils: Pupils are equal, round, and reactive to light.  ?Neck:  ?   Thyroid: No thyroid mass or thyromegaly.  ?   Vascular: No carotid bruit or JVD.  ?   Trachea: Phonation normal.  ?Cardiovascular:  ?   Rate and Rhythm: Normal rate and regular rhythm.  ?Pulmonary:  ?   Effort: Pulmonary effort is normal. No respiratory distress.  ?   Breath sounds: Normal breath sounds.  ?Abdominal:  ?   General: Bowel sounds are normal.  ?   Palpations: Abdomen is soft.  ?   Tenderness: There is no abdominal tenderness.  ?Musculoskeletal:     ?   General: Normal range of motion.  ?   Cervical back: Normal range of motion and neck supple.  ?Lymphadenopathy:  ?   Cervical: No cervical adenopathy.  ?Skin: ?   General: Skin is warm and dry.  ?Neurological:  ?   Mental Status: He is alert and oriented to person, place, and time.  ?Psychiatric:     ?   Behavior: Behavior normal.     ?   Thought Content: Thought content normal.     ?   Judgment: Judgment normal.  ? ? ?BP 124/86   Pulse 82   Temp 97.8 ?F (36.6 ?C) (Temporal)   Resp 20   Ht '5\' 9"'$  (1.753 m)   Wt 225 lb (102.1 kg)   SpO2 96%   BMI 33.23 kg/m?  ? ? ? ?   ?Assessment & Plan:  ?Tyler Barton comes in today with chief complaint of Medical Management  of Chronic Issues ? ? ?  Diagnosis and orders addressed: ? ?1. Primary hypertension ?Low sodium diet ? ?2. Irritable bowel syndrome with both constipation and diarrhea ?Continue stool softners ? ?3. Panic disorder ?Stress management ? ?4. Recurrent major depressive disorder, in full remission ([place]) ? ?5. Anxiety ?Stress management ? ?6. BMI 33.0-33.9,adult ?Discussed diet and exercise for person with BMI >25 ?Will recheck weight in 3-6 months ? ? ? ? ?Labs pending ?Health Maintenance reviewed ?Diet and exercise encouraged ? ?Follow up plan: ?3 months ? ? ?Mary-Margaret Hassell Done, FNP ? ? ?

## 2021-10-21 NOTE — Addendum Note (Signed)
Addended by: Chevis Pretty on: 10/21/2021 08:37 AM ? ? Modules accepted: Orders ? ?

## 2021-10-22 LAB — CBC WITH DIFFERENTIAL/PLATELET
Basophils Absolute: 0.1 10*3/uL (ref 0.0–0.2)
Basos: 1 %
EOS (ABSOLUTE): 0.3 10*3/uL (ref 0.0–0.4)
Eos: 5 %
Hematocrit: 45.6 % (ref 37.5–51.0)
Hemoglobin: 14.9 g/dL (ref 13.0–17.7)
Immature Grans (Abs): 0 10*3/uL (ref 0.0–0.1)
Immature Granulocytes: 0 %
Lymphocytes Absolute: 1.7 10*3/uL (ref 0.7–3.1)
Lymphs: 28 %
MCH: 29.3 pg (ref 26.6–33.0)
MCHC: 32.7 g/dL (ref 31.5–35.7)
MCV: 90 fL (ref 79–97)
Monocytes Absolute: 0.6 10*3/uL (ref 0.1–0.9)
Monocytes: 10 %
Neutrophils Absolute: 3.5 10*3/uL (ref 1.4–7.0)
Neutrophils: 56 %
Platelets: 381 10*3/uL (ref 150–450)
RBC: 5.09 x10E6/uL (ref 4.14–5.80)
RDW: 13.5 % (ref 11.6–15.4)
WBC: 6.2 10*3/uL (ref 3.4–10.8)

## 2021-10-22 LAB — CMP14+EGFR
ALT: 36 IU/L (ref 0–44)
AST: 27 IU/L (ref 0–40)
Albumin/Globulin Ratio: 2.3 — ABNORMAL HIGH (ref 1.2–2.2)
Albumin: 4.5 g/dL (ref 3.8–4.9)
Alkaline Phosphatase: 53 IU/L (ref 44–121)
BUN/Creatinine Ratio: 7 — ABNORMAL LOW (ref 9–20)
BUN: 9 mg/dL (ref 6–24)
Bilirubin Total: 0.4 mg/dL (ref 0.0–1.2)
CO2: 27 mmol/L (ref 20–29)
Calcium: 9.5 mg/dL (ref 8.7–10.2)
Chloride: 104 mmol/L (ref 96–106)
Creatinine, Ser: 1.21 mg/dL (ref 0.76–1.27)
Globulin, Total: 2 g/dL (ref 1.5–4.5)
Glucose: 91 mg/dL (ref 70–99)
Potassium: 4.3 mmol/L (ref 3.5–5.2)
Sodium: 143 mmol/L (ref 134–144)
Total Protein: 6.5 g/dL (ref 6.0–8.5)
eGFR: 72 mL/min/{1.73_m2} (ref >59)

## 2021-10-22 LAB — LIPID PANEL
Chol/HDL Ratio: 7.1 ratio — ABNORMAL HIGH (ref 0.0–5.0)
Cholesterol, Total: 227 mg/dL — ABNORMAL HIGH (ref 100–199)
HDL: 32 mg/dL — ABNORMAL LOW (ref >39)
LDL Chol Calc (NIH): 157 mg/dL — ABNORMAL HIGH (ref 0–99)
Triglycerides: 203 mg/dL — ABNORMAL HIGH (ref 0–149)
VLDL Cholesterol Cal: 38 mg/dL (ref 5–40)

## 2021-10-23 ENCOUNTER — Other Ambulatory Visit: Payer: Self-pay | Admitting: Nurse Practitioner

## 2021-10-23 DIAGNOSIS — K582 Mixed irritable bowel syndrome: Secondary | ICD-10-CM

## 2021-10-28 DIAGNOSIS — Z1211 Encounter for screening for malignant neoplasm of colon: Secondary | ICD-10-CM | POA: Diagnosis not present

## 2021-11-06 LAB — COLOGUARD: COLOGUARD: NEGATIVE

## 2021-11-17 ENCOUNTER — Ambulatory Visit (INDEPENDENT_AMBULATORY_CARE_PROVIDER_SITE_OTHER): Payer: Medicare Other | Admitting: Family Medicine

## 2021-11-17 ENCOUNTER — Encounter: Payer: Self-pay | Admitting: Family Medicine

## 2021-11-17 DIAGNOSIS — J0100 Acute maxillary sinusitis, unspecified: Secondary | ICD-10-CM

## 2021-11-17 DIAGNOSIS — H66009 Acute suppurative otitis media without spontaneous rupture of ear drum, unspecified ear: Secondary | ICD-10-CM

## 2021-11-17 MED ORDER — AMOXICILLIN-POT CLAVULANATE 875-125 MG PO TABS: 1.0000 | ORAL_TABLET | Freq: Two times a day (BID) | ORAL | 0 refills | Status: DC

## 2021-11-17 MED ORDER — PSEUDOEPHEDRINE-GUAIFENESIN ER 60-600 MG PO TB12: 1.0000 | ORAL_TABLET | Freq: Two times a day (BID) | ORAL | 0 refills | Status: AC

## 2021-11-17 NOTE — Progress Notes (Signed)
? ? ?Subjective:  ? ? Patient ID: Tyler Barton, male    DOB: 24-Jul-1970, 51 y.o.   MRN: [id number] ? ? ?HPI: ?CORRY IHNEN is a 51 y.o. male presenting for AM PND with cough for a hour to clear it. Worse with cutting the lawn. Hx of frequent sinus infection. Right ear stopped up X 2-3 days. No dyspnea. No congestion through the day. Sneezing. Using OTC sinus/allergy med, loratidine. LEaving for beach [place] 2 days. Not feeling good. Using nasal spray. Using OTC flonase.  ? ? ? ?  10/21/2021  ?  8:03 AM 07/18/2021  ?  8:07 AM 06/09/2021  ? 12:11 PM 01/16/2021  ?  9:06 AM 01/14/2021  ?  8:25 AM  ?Depression screen PHQ 2/9  ?Decreased Interest 2 0 1 0 0  ?Down, Depressed, Hopeless 0 0 0 0 0  ?PHQ - 2 Score 2 0 1 0 0  ?Altered sleeping 0 1 0 0 0  ?Tired, decreased energy '2 1 3 2 2  '$ ?Change in appetite '2 1 3 2 2  '$ ?Feeling bad or failure about yourself  0 0 0 0 0  ?Trouble concentrating '1 1 1 '$ 0 0  ?Moving slowly or fidgety/restless 0 0 0 0 0  ?Suicidal thoughts 0 0 0 0 0  ?PHQ-9 Score '7 4 8 4 4  '$ ?Difficult doing work/chores Somewhat difficult Somewhat difficult Somewhat difficult Somewhat difficult Not difficult at all  ? ? ? ?Relevant past medical, surgical, family and social history reviewed and updated as indicated.  ?Interim medical history since our last visit reviewed. ?Allergies and medications reviewed and updated. ? ?ROS:  ?Review of Systems  ?Constitutional:  Negative for fever.  ?HENT:  Positive for ear pain (stopped up, feels like he's talking in a barrel).   ?Cardiovascular:  Negative for chest pain.  ?Musculoskeletal:  Negative for arthralgias.  ?Skin:  Negative for rash.   ? ?Social History  ? ?Tobacco Use  ?Smoking Status Never  ?Smokeless Tobacco Never  ? ? ?   ?Objective:  ?  ? ?Wt Readings from Last 3 Encounters:  ?10/21/21 225 lb (102.1 kg)  ?07/18/21 228 lb (103.4 kg)  ?06/09/21 234 lb (106.1 kg)  ?  ? ?Exam deferred. Pt. [place] due to COVID 19. Phone visit performed.  ? ?Assessment & Plan:  ? ?1. Acute  maxillary sinusitis, recurrence not specified   ?2. Acute suppurative otitis media without spontaneous rupture of ear drum, recurrence not specified, unspecified laterality   ? ? ?Meds ordered this encounter  ?Medications  ? amoxicillin-clavulanate (AUGMENTIN) 875-125 MG tablet  ?  Sig: Take 1 tablet by mouth 2 (two) times daily. Take all of this medication  ?  Dispense:  20 tablet  ?  Refill:  0  ? pseudoephedrine-guaifenesin (MUCINEX D) 60-600 MG 12 hr tablet  ?  Sig: Take 1 tablet by mouth every 12 (twelve) hours for 14 days. As needed for congestion  ?  Dispense:  20 tablet  ?  Refill:  0  ? ? ?No orders of the defined types were placed in this encounter. ? ?  ? ?Diagnoses and all orders for this visit: ? ?Acute maxillary sinusitis, recurrence not specified ? ?Acute suppurative otitis media without spontaneous rupture of ear drum, recurrence not specified, unspecified laterality ? ?Other orders ?-     amoxicillin-clavulanate (AUGMENTIN) 875-125 MG tablet; Take 1 tablet by mouth 2 (two) times daily. Take all of this medication ?-     pseudoephedrine-guaifenesin [facility]  D) 60-600 MG 12 hr tablet; Take 1 tablet by mouth every 12 (twelve) hours for 14 days. As needed for congestion ? ? ? ?Virtual Visit via telephone Note ? ?I discussed the limitations, risks, security and privacy concerns of performing an evaluation and management service by telephone and the availability of in person appointments. The patient was identified with two identifiers. Pt.expressed understanding and agreed to proceed. Pt. Is at home. Dr. Livia Snellen is in his office. ? ?Follow Up Instructions: ?  ?I discussed the assessment and treatment plan with the patient. The patient was provided an opportunity to ask questions and all were answered. The patient agreed with the plan and demonstrated an understanding of the instructions. ?  ?The patient was advised to call back or seek an in-person evaluation if the symptoms worsen or if the condition  fails to improve as anticipated. ? ? ?Total minutes including chart review and phone contact time: 11 ? ? ?Follow up plan: ?Return if symptoms worsen or fail to improve. ? ?Claretta Fraise, MD ?[place] ? ?

## 2021-11-27 ENCOUNTER — Encounter: Payer: Self-pay | Admitting: Nurse Practitioner

## 2021-11-27 ENCOUNTER — Ambulatory Visit (INDEPENDENT_AMBULATORY_CARE_PROVIDER_SITE_OTHER): Payer: Medicare Other

## 2021-11-27 ENCOUNTER — Ambulatory Visit (INDEPENDENT_AMBULATORY_CARE_PROVIDER_SITE_OTHER): Payer: Medicare Other | Admitting: Nurse Practitioner

## 2021-11-27 VITALS — BP 117/78 | HR 73 | Temp 98.1°F | Resp 20 | Ht 69.0 in | Wt 218.0 lb

## 2021-11-27 DIAGNOSIS — M25551 Pain in right hip: Secondary | ICD-10-CM | POA: Diagnosis not present

## 2021-11-27 DIAGNOSIS — M25511 Pain in right shoulder: Secondary | ICD-10-CM | POA: Diagnosis not present

## 2021-11-27 DIAGNOSIS — M25512 Pain in left shoulder: Secondary | ICD-10-CM

## 2021-11-27 DIAGNOSIS — G8929 Other chronic pain: Secondary | ICD-10-CM

## 2021-11-27 DIAGNOSIS — H6691 Otitis media, unspecified, right ear: Secondary | ICD-10-CM

## 2021-11-27 DIAGNOSIS — M25552 Pain in left hip: Secondary | ICD-10-CM

## 2021-11-27 MED ORDER — CELECOXIB 200 MG PO CAPS: 200.0000 mg | ORAL_CAPSULE | Freq: Two times a day (BID) | ORAL | 2 refills | Status: DC

## 2021-11-27 NOTE — Progress Notes (Signed)
Subjective:    Patient ID: Tyler Barton, male    DOB: 09-07-1970, 51 y.o.   MRN: [id number]   Chief Complaint: Right ear stopped up, bilateral hip pain, and bilateral shoulder pain   HPI Patient comes in today with several complaints: - Patient says he feels ike his ears are stopped up. Denies pain or drainage. - bil hip pain- started several months ago. Rates pain 8/10 and is worse at night. Wakes him up almost every night. Pain with walking for long periods of time. - bil shoulder pain. Has been going on for several months. Rates pain 6/10. Worse at night if he lays on his side. No OTC meds have been tried.   Review of Systems  Constitutional:  Negative for diaphoresis.  Eyes:  Negative for pain.  Respiratory:  Negative for shortness of breath.   Cardiovascular:  Negative for chest pain, palpitations and leg swelling.  Gastrointestinal:  Negative for abdominal pain.  Endocrine: Negative for polydipsia.  Skin:  Negative for rash.  Neurological:  Negative for dizziness, weakness and headaches.  Hematological:  Does not bruise/bleed easily.  All other systems reviewed and are negative.     Objective:   Physical Exam Vitals and nursing note reviewed.  Constitutional:      Appearance: Normal appearance. He is well-developed.  Neck:     Thyroid: No thyroid mass or thyromegaly.     Vascular: No carotid bruit or JVD.     Trachea: Phonation normal.  Cardiovascular:     Rate and Rhythm: Normal rate and regular rhythm.  Pulmonary:     Effort: Pulmonary effort is normal. No respiratory distress.     Breath sounds: Normal breath sounds.  Abdominal:     General: Bowel sounds are normal.     Palpations: Abdomen is soft.     Tenderness: There is no abdominal tenderness.  Musculoskeletal:        General: Normal range of motion.     Cervical back: Normal range of motion and neck supple.     Comments: rises slowly form sitting or standing FROM of hips without pain FROM of bil  shoulders without pain  Lymphadenopathy:     Cervical: No cervical adenopathy.  Skin:    General: Skin is warm and dry.  Neurological:     Mental Status: He is alert and oriented to person, place, and time.  Psychiatric:        Behavior: Behavior normal.        Thought Content: Thought content normal.        Judgment: Judgment normal.    BP 117/78   Pulse 73   Temp 98.1 F (36.7 C) (Temporal)   Resp 20   Ht '5\' 9"'$  (1.753 m)   Wt 218 lb (98.9 kg)   SpO2 98%   BMI 32.19 kg/m   Right hip xray- mild osteoarthritis-Preliminary reading by Ronnald Collum, FNP  [facility]      Assessment & Plan:   [name] in today with chief complaint of Right ear stopped up, bilateral hip pain, and bilateral shoulder pain   1. Bilateral hip pain Labs pending Moist heat - DG HIP UNILAT W OR W/O PELVIS 2-3 VIEWS RIGHT - Arthritis Panel  2. Chronic pain of both shoulders Celebrex as prescribed  3. Right chronic otitis media Force fluids Continue flonase daily Chew gum OTC decongestant RTO prn    The above assessment and management plan was discussed with the patient. The patient  verbalized understanding of and has agreed to the management plan. Patient is aware to call the clinic if symptoms persist or worsen. Patient is aware when to return to the clinic for a follow-up visit. Patient educated on when it is appropriate to go to the emergency department.   Mary-Margaret Hassell Done, FNP

## 2021-11-27 NOTE — Patient Instructions (Signed)
Hip Pain The hip is the joint between the upper legs and the lower pelvis. The bones, cartilage, tendons, and muscles of your hip joint support your body and allow you to move around. Hip pain can range from a minor ache to severe pain in one or both of your hips. The pain may be felt on the inside of the hip joint near the groin, or on the outside near the buttocks and upper thigh. You may also have swelling or stiffness in your hip area. Follow these instructions at home: Managing pain, stiffness, and swelling     If directed, put ice on the painful area. To do this: Put ice in a plastic bag. Place a towel between your skin and the bag. Leave the ice on for 20 minutes, 2-3 times a day. If directed, apply heat to the affected area as often as told by your health care provider. Use the heat source that your health care provider recommends, such as a moist heat pack or a heating pad. Place a towel between your skin and the heat source. Leave the heat on for 20-30 minutes. Remove the heat if your skin turns bright red. This is especially important if you are unable to feel pain, heat, or cold. You may have a greater risk of getting burned. Activity Do exercises as told by your health care provider. Avoid activities that cause pain. General instructions  Take over-the-counter and prescription medicines only as told by your health care provider. Keep a journal of your symptoms. Write down: How often you have hip pain. The location of your pain. What the pain feels like. What makes the pain worse. Sleep with a pillow between your legs on your most comfortable side. Keep all follow-up visits as told by your health care provider. This is important. Contact a health care provider if: You cannot put weight on your leg. Your pain or swelling continues or gets worse after one week. It gets harder to walk. You have a fever. Get help right away if: You fall. You have a sudden increase in pain  and swelling in your hip. Your hip is red or swollen or very tender to touch. Summary Hip pain can range from a minor ache to severe pain in one or both of your hips. The pain may be felt on the inside of the hip joint near the groin, or on the outside near the buttocks and upper thigh. Avoid activities that cause pain. Write down how often you have hip pain, the location of the pain, what makes it worse, and what it feels like. This information is not intended to replace advice given to you by your health care provider. Make sure you discuss any questions you have with your health care provider. Document Revised: 11/07/2018 Document Reviewed: 11/07/2018 Elsevier Patient Education  2023 Elsevier Inc.  

## 2021-12-16 ENCOUNTER — Encounter: Payer: Self-pay | Admitting: Nurse Practitioner

## 2021-12-16 ENCOUNTER — Ambulatory Visit (INDEPENDENT_AMBULATORY_CARE_PROVIDER_SITE_OTHER): Payer: Medicare Other | Admitting: Nurse Practitioner

## 2021-12-16 DIAGNOSIS — Z024 Encounter for examination for driving license: Secondary | ICD-10-CM | POA: Diagnosis not present

## 2021-12-16 LAB — URINALYSIS
Bilirubin, UA: NEGATIVE
Glucose, UA: NEGATIVE
Ketones, UA: NEGATIVE
Leukocytes,UA: NEGATIVE
Nitrite, UA: NEGATIVE
Protein,UA: NEGATIVE
Specific Gravity, UA: 1.02 (ref 1.005–1.030)
Urobilinogen, Ur: 0.2 mg/dL (ref 0.2–1.0)
pH, UA: 6 (ref 5.0–7.5)

## 2021-12-16 NOTE — Progress Notes (Deleted)
   Subjective:    Patient ID: Tyler Barton, male    DOB: [birth year], 51 y.o.   MRN: [id number]   Chief Complaint: medical management of chronic issues     HPI:  Tyler Barton is a 51 y.o. who identifies as a male who was assigned male at birth.   Social history: Lives with: *** Work history: ***   Comes in today for follow up of the following chronic medical issues:  1. Primary hypertension No c/o chest pain, sob or headache. Does not check blood pressure at home. BP Readings from Last 3 Encounters:  11/27/21 117/78  10/21/21 124/86  07/18/21 125/85     2. Irritable bowel syndrome with both constipation and diarrhea ***  3. Panic disorder ***  4. Anxiety ***  5. Recurrent major depressive disorder, in full remission (HCC) ***  6. Morbid obesity ([place]) ***   New complaints: ***  No Known Allergies Outpatient Encounter Medications as of 12/16/2021  Medication Sig   amoxicillin-clavulanate (AUGMENTIN) 875-125 MG tablet Take 1 tablet by mouth 2 (two) times daily. Take all of this medication   buPROPion (WELLBUTRIN XL) 150 MG 24 hr tablet Take 1 tablet (150 mg total) by mouth daily.   celecoxib (CELEBREX) 200 MG capsule Take 1 capsule (200 mg total) by mouth 2 (two) times daily.   diazepam (VALIUM) 5 MG tablet Take 1 tablet (5 mg total) by mouth every 12 (twelve) hours as needed. for anxiety   famotidine (PEPCID) 20 MG tablet Take 1 tablet by mouth twice daily   fluticasone (FLONASE) 50 MCG/ACT nasal spray Place 2 sprays into both nostrils daily.   lisinopril (ZESTRIL) 10 MG tablet Take 1 tablet (10 mg total) by mouth daily.   loratadine (EQ ALLERGY RELIEF) 10 MG tablet Take 1 tablet (10 mg total) by mouth daily.   sildenafil (VIAGRA) 50 MG tablet Take 1 tablet (50 mg total) by mouth daily as needed for erectile dysfunction.   No facility-administered encounter medications on file as of 12/16/2021.    Past Surgical History:  Procedure Laterality Date   KNEE  ARTHROSCOPY Left 1993   LIPOMA EXCISION      Family History  Problem Relation Age of Onset   Anxiety disorder Mother    Anxiety disorder Father    Heart disease Father 55       with stent placement and bypass   Anxiety disorder Sister    Cancer Maternal Uncle        throat   Alzheimer's disease Maternal Grandmother    Cancer Paternal Grandmother    Heart disease Maternal Uncle    Heart disease Maternal Uncle       Controlled substance contract: ***     Review of Systems     Objective:   Physical Exam        Assessment & Plan:

## 2021-12-16 NOTE — Progress Notes (Signed)
[employer]- see scanned document 

## 2022-01-13 ENCOUNTER — Ambulatory Visit (INDEPENDENT_AMBULATORY_CARE_PROVIDER_SITE_OTHER): Payer: Medicare Other | Admitting: Nurse Practitioner

## 2022-01-13 ENCOUNTER — Encounter: Payer: Self-pay | Admitting: Nurse Practitioner

## 2022-01-13 VITALS — BP 131/91 | HR 65 | Temp 97.9°F | Resp 20 | Ht 69.0 in | Wt 223.0 lb

## 2022-01-13 DIAGNOSIS — I10 Essential (primary) hypertension: Secondary | ICD-10-CM

## 2022-01-13 DIAGNOSIS — F3342 Major depressive disorder, recurrent, in full remission: Secondary | ICD-10-CM | POA: Diagnosis not present

## 2022-01-13 DIAGNOSIS — Z79891 Long term (current) use of opiate analgesic: Secondary | ICD-10-CM | POA: Diagnosis not present

## 2022-01-13 DIAGNOSIS — F410 Panic disorder [episodic paroxysmal anxiety] without agoraphobia: Secondary | ICD-10-CM | POA: Diagnosis not present

## 2022-01-13 DIAGNOSIS — K582 Mixed irritable bowel syndrome: Secondary | ICD-10-CM | POA: Diagnosis not present

## 2022-01-13 DIAGNOSIS — F419 Anxiety disorder, unspecified: Secondary | ICD-10-CM

## 2022-01-13 MED ORDER — DIAZEPAM 5 MG PO TABS: 5.0000 mg | ORAL_TABLET | Freq: Two times a day (BID) | ORAL | 5 refills | Status: DC | PRN

## 2022-01-13 MED ORDER — LISINOPRIL 10 MG PO TABS: 10.0000 mg | ORAL_TABLET | Freq: Every day | ORAL | 1 refills | Status: DC

## 2022-01-13 MED ORDER — BUPROPION HCL ER (XL) 150 MG PO TB24: 150.0000 mg | ORAL_TABLET | Freq: Every day | ORAL | 1 refills | Status: DC

## 2022-01-13 MED ORDER — FAMOTIDINE 20 MG PO TABS
20.0000 mg | ORAL_TABLET | Freq: Two times a day (BID) | ORAL | 1 refills | Status: DC
Start: 2022-01-13 — End: 2022-07-17

## 2022-01-13 NOTE — Patient Instructions (Signed)

## 2022-01-13 NOTE — Progress Notes (Signed)
Subjective:    Patient ID: Tyler Barton, male    DOB: [birth year], 51 y.o.   MRN: [id number]   Chief Complaint: medical management of chronic issues  wife   HPI:  Tyler Barton is a 13 y.o. who identifies as a male who was assigned male at birth.   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   Comes in today for follow up of the following chronic medical issues:  1. Primary hypertension No c/o chest pain, sob or headache. Does not check blood pressure at home. BP Readings from Last 3 Encounters:  11/27/21 117/78  10/21/21 124/86  07/18/21 125/85     2. Irritable bowel syndrome with both constipation and diarrhea Alternates from constipation to diarrha, but he says he has been doing well asof late.  3. Recurrent major depressive disorder, in full remission (Tyler Barton) He is on wellbutirn daily and is doing well.    01/13/2022    8:45 AM 10/21/2021    8:03 AM 07/18/2021    8:07 AM  Depression screen PHQ 2/9  Decreased Interest 1 2 0  Down, Depressed, Hopeless 0 0 0  PHQ - 2 Score 1 2 0  Altered sleeping 0 0 1  Tired, decreased energy 1 2 1   Change in appetite 1 2 1   Feeling bad or failure about yourself  0 0 0  Trouble concentrating 1 1 1   Moving slowly or fidgety/restless 0 0 0  Suicidal thoughts 0 0 0  PHQ-9 Score 4 7 4   Difficult doing work/chores Somewhat difficult Somewhat difficult Somewhat difficult     4. Panic disorder 5. Anxiety He worries about eveything. Once he gets somethoing on his mind he cant get rid of it. Will get panicky at times.     01/13/2022    8:46 AM 10/21/2021    8:03 AM 07/18/2021    8:08 AM 06/09/2021   12:11 PM  GAD 7 : Generalized Anxiety Score  Nervous, Anxious, on Edge 1 2 1 3   Control/stop worrying 2 3 1 2   Worry too much - different things 2 2 1 2   Trouble relaxing 1 2 0 0  Restless 1 1 0 0  Easily annoyed or irritable 1 1 1 1   Afraid - awful might happen 1 1 0 0  Total GAD 7 Score 9 12  4 8   Anxiety Difficulty Somewhat difficult Somewhat difficult Somewhat difficult Very difficult      6. Morbid obesity (Tyler Barton) Weight is up 5lbs Wt Readings from Last 3 Encounters:  01/13/22 223 lb (101.2 kg)  11/27/21 218 lb (98.9 kg)  10/21/21 225 lb (102.1 kg)   BMI Readings from Last 3 Encounters:  01/13/22 32.93 kg/m  11/27/21 32.19 kg/m  10/21/21 33.23 kg/m     New complaints: None today  No Known Allergies Outpatient Encounter Medications as of 01/13/2022  Medication Sig   amoxicillin-clavulanate (AUGMENTIN) 875-125 MG tablet Take 1 tablet by mouth 2 (two) times daily. Take all of this medication   buPROPion (WELLBUTRIN XL) 150 MG 24 hr tablet Take 1 tablet (150 mg total) by mouth daily.   celecoxib (CELEBREX) 200 MG capsule Take 1 capsule (200 mg total) by mouth 2 (two) times daily.   diazepam (VALIUM) 5 MG tablet Take 1 tablet (5 mg total) by mouth every 12 (twelve) hours as needed. for anxiety   famotidine (PEPCID) 20 MG tablet Take 1 tablet by mouth twice daily   fluticasone (  FLONASE) 50 MCG/ACT nasal spray Place 2 sprays into both nostrils daily.   lisinopril (ZESTRIL) 10 MG tablet Take 1 tablet (10 mg total) by mouth daily.   loratadine (EQ ALLERGY RELIEF) 10 MG tablet Take 1 tablet (10 mg total) by mouth daily.   sildenafil (VIAGRA) 50 MG tablet Take 1 tablet (50 mg total) by mouth daily as needed for erectile dysfunction.   No facility-administered encounter medications on file as of 01/13/2022.    Past Surgical History:  Procedure Laterality Date   KNEE ARTHROSCOPY Left 1993   LIPOMA EXCISION      Family History  Problem Relation Age of Onset   Anxiety disorder Mother    Anxiety disorder Father    Heart disease Father 81       with stent placement and bypass   Anxiety disorder Sister    Cancer Maternal Uncle        throat   Alzheimer's disease Maternal Grandmother    Cancer Paternal Grandmother    Heart disease Maternal Uncle    Heart disease  Maternal Uncle       Controlled substance contract: 01/13/22- drug screen today     Review of Systems  Constitutional:  Negative for diaphoresis.  Eyes:  Negative for pain.  Respiratory:  Negative for shortness of breath.   Cardiovascular:  Negative for chest pain, palpitations and leg swelling.  Gastrointestinal:  Negative for abdominal pain.  Endocrine: Negative for polydipsia.  Skin:  Negative for rash.  Neurological:  Negative for dizziness, weakness and headaches.  Hematological:  Does not bruise/bleed easily.  All other systems reviewed and are negative.      Objective:   Physical Exam Vitals and nursing note reviewed.  Constitutional:      Appearance: Normal appearance. He is well-developed.  HENT:     Head: Normocephalic.     Nose: Nose normal.     Mouth/Throat:     Mouth: Mucous membranes are moist.     Pharynx: Oropharynx is clear.  Eyes:     Pupils: Pupils are equal, round, and reactive to light.  Neck:     Thyroid: No thyroid mass or thyromegaly.     Vascular: No carotid bruit or JVD.     Trachea: Phonation normal.  Cardiovascular:     Rate and Rhythm: Normal rate and regular rhythm.  Pulmonary:     Effort: Pulmonary effort is normal. No respiratory distress.     Breath sounds: Normal breath sounds.  Abdominal:     General: Bowel sounds are normal.     Palpations: Abdomen is soft.     Tenderness: There is no abdominal tenderness.  Musculoskeletal:        General: Normal range of motion.     Cervical back: Normal range of motion and neck supple.  Lymphadenopathy:     Cervical: No cervical adenopathy.  Skin:    General: Skin is warm and dry.  Neurological:     Mental Status: He is alert and oriented to person, place, and time.  Psychiatric:        Behavior: Behavior normal.        Thought Content: Thought content normal.        Judgment: Judgment normal.    BP (!) 131/91   Pulse 65   Temp 97.9 F (36.6 C) (Temporal)   Resp 20   Ht $R'5\' 9"'lw$   (1.753 m)   Wt 223 lb (101.2 kg)   SpO2 100%   BMI 32.93  kg/m         Assessment & Plan:   Tyler Barton comes in today with chief complaint of Medical Management of Chronic Issues   Diagnosis and orders addressed:  1. Primary hypertension Low sodium diet - lisinopril (ZESTRIL) 10 MG tablet; Take 1 tablet (10 mg total) by mouth daily.  Dispense: 90 tablet; Refill: 1 - CBC with Differential/Platelet - CMP14+EGFR - Lipid panel  2. Irritable bowel syndrome with both constipation and diarrhea Watch diet to prevent flare ups - famotidine (PEPCID) 20 MG tablet; Take 1 tablet (20 mg total) by mouth 2 (two) times daily.  Dispense: 180 tablet; Refill: 1  3. Recurrent major depressive disorder, in full remission ([place]) Stress management - buPROPion (WELLBUTRIN XL) 150 MG 24 hr tablet; Take 1 tablet (150 mg total) by mouth daily.  Dispense: 90 tablet; Refill: 1  4. Panic disorder 5. Anxiety - ToxASSURE Select 13 (MW), Urine  diazepam (VALIUM) 5 MG tablet; Take 1 tablet (5 mg total) by mouth every 12 (twelve) hours as needed. for anxiety  Dispense: 60 tablet; Refill: 5  6. Morbid obesity (Tyler Barton) Discussed diet and exercise for person with BMI >25 Will recheck weight in 3-6 months    Labs pending Health Maintenance reviewed Diet and exercise encouraged  Follow up plan: 6 months   Mary-Margaret Hassell Done, FNP

## 2022-01-16 LAB — TOXASSURE SELECT 13 (MW), URINE

## 2022-01-19 ENCOUNTER — Ambulatory Visit (INDEPENDENT_AMBULATORY_CARE_PROVIDER_SITE_OTHER): Payer: Medicare Other

## 2022-01-19 VITALS — Wt 223.0 lb

## 2022-01-19 DIAGNOSIS — Z0000 Encounter for general adult medical examination without abnormal findings: Secondary | ICD-10-CM | POA: Diagnosis not present

## 2022-01-19 NOTE — Patient Instructions (Signed)
Mr. Tyler Barton , Thank you for taking time to come for your Medicare Wellness Visit. I appreciate your ongoing commitment to your health goals. Please review the following plan we discussed and let me know if I can assist you in the future.   Screening recommendations/referrals: Colonoscopy: Cologuard done 10/28/2021 - repeat in 3 years Recommended yearly ophthalmology/optometry visit for glaucoma screening and checkup Recommended yearly dental visit for hygiene and checkup  Vaccinations: declines all Influenza vaccine: recommend every Fall Pneumococcal vaccine: recommend once per lifetime Prevnar-20 Tdap vaccine: recommend every 10 years Shingles vaccine: recommend Shingrix which is 2 doses 2-6 months apart and over 90% effective     Covid-19: recommend 2 doses one month apart with a booster 6 months later   Advanced directives: Advance directive discussed with you today. Even though you declined this today, please call our office should you change your mind, and we can give you the proper paperwork for you to fill out.   Conditions/risks identified: Aim for 30 minutes of exercise or brisk walking, 6-8 glasses of water, and 5 servings of fruits and vegetables each day.   Next appointment: Follow up in one year for your annual wellness visit   Preventive Care 40-64 Years, Male Preventive care refers to lifestyle choices and visits with your health care provider that can promote health and wellness. What does preventive care include? A yearly physical exam. This is also called an annual well check. Dental exams once or twice a year. Routine eye exams. Ask your health care provider how often you should have your eyes checked. Personal lifestyle choices, including: Daily care of your teeth and gums. Regular physical activity. Eating a healthy diet. Avoiding tobacco and drug use. Limiting alcohol use. Practicing safe sex. Taking low-dose aspirin every day starting at age 1. What happens  during an annual well check? The services and screenings done by your health care provider during your annual well check will depend on your age, overall health, lifestyle risk factors, and family history of disease. Counseling  Your health care provider may ask you questions about your: Alcohol use. Tobacco use. Drug use. Emotional well-being. Home and relationship well-being. Sexual activity. Eating habits. Work and work Statistician. Screening  You may have the following tests or measurements: Height, weight, and BMI. Blood pressure. Lipid and cholesterol levels. These may be checked every 5 years, or more frequently if you are over 37 years old. Skin check. Lung cancer screening. You may have this screening every year starting at age 42 if you have a 30-pack-year history of smoking and currently smoke or have quit within the past 15 years. Fecal occult blood test (FOBT) of the stool. You may have this test every year starting at age 38. Flexible sigmoidoscopy or colonoscopy. You may have a sigmoidoscopy every 5 years or a colonoscopy every 10 years starting at age 43. Prostate cancer screening. Recommendations will vary depending on your family history and other risks. Hepatitis C blood test. Hepatitis B blood test. Sexually transmitted disease (STD) testing. Diabetes screening. This is done by checking your blood sugar (glucose) after you have not eaten for a while (fasting). You may have this done every 1-3 years. Discuss your test results, treatment options, and if necessary, the need for more tests with your health care provider. Vaccines  Your health care provider may recommend certain vaccines, such as: Influenza vaccine. This is recommended every year. Tetanus, diphtheria, and acellular pertussis (Tdap, Td) vaccine. You may need a Td booster every  10 years. Zoster vaccine. You may need this after age 68. Pneumococcal 13-valent conjugate (PCV13) vaccine. You may need this if  you have certain conditions and have not been vaccinated. Pneumococcal polysaccharide (PPSV23) vaccine. You may need one or two doses if you smoke cigarettes or if you have certain conditions. Talk to your health care provider about which screenings and vaccines you need and how often you need them. This information is not intended to replace advice given to you by your health care provider. Make sure you discuss any questions you have with your health care provider. Document Released: 07/19/2015 Document Revised: 03/11/2016 Document Reviewed: 04/23/2015 Elsevier Interactive Patient Education  2017 [place] Prevention in the Home Falls can cause injuries. They can happen to people of all ages. There are many things you can do to make your home safe and to help prevent falls. What can I do on the outside of my home? Regularly fix the edges of walkways and driveways and fix any cracks. Remove anything that might make you trip as you walk through a door, such as a raised step or threshold. Trim any bushes or trees on the path to your home. Use bright outdoor lighting. Clear any walking paths of anything that might make someone trip, such as rocks or tools. Regularly check to see if handrails are loose or broken. Make sure that both sides of any steps have handrails. Any raised decks and porches should have guardrails on the edges. Have any leaves, snow, or ice cleared regularly. Use sand or salt on walking paths during winter. Clean up any spills in your garage right away. This includes oil or grease spills. What can I do in the bathroom? Use night lights. Install grab bars by the toilet and in the tub and shower. Do not use towel bars as grab bars. Use non-skid mats or decals in the tub or shower. If you need to sit down in the shower, use a plastic, non-slip stool. Keep the floor dry. Clean up any water that spills on the floor as soon as it happens. Remove soap buildup in the  tub or shower regularly. Attach bath mats securely with double-sided non-slip rug tape. Do not have throw rugs and other things on the floor that can make you trip. What can I do in the bedroom? Use night lights. Make sure that you have a light by your bed that is easy to reach. Do not use any sheets or blankets that are too big for your bed. They should not hang down onto the floor. Have a firm chair that has side arms. You can use this for support while you get dressed. Do not have throw rugs and other things on the floor that can make you trip. What can I do in the kitchen? Clean up any spills right away. Avoid walking on wet floors. Keep items that you use a lot in easy-to-reach places. If you need to reach something above you, use a strong step stool that has a grab bar. Keep electrical cords out of the way. Do not use floor polish or wax that makes floors slippery. If you must use wax, use non-skid floor wax. Do not have throw rugs and other things on the floor that can make you trip. What can I do with my stairs? Do not leave any items on the stairs. Make sure that there are handrails on both sides of the stairs and use them. Fix handrails that are broken  or loose. Make sure that handrails are as long as the stairways. Check any carpeting to make sure that it is firmly attached to the stairs. Fix any carpet that is loose or worn. Avoid having throw rugs at the top or bottom of the stairs. If you do have throw rugs, attach them to the floor with carpet tape. Make sure that you have a light switch at the top of the stairs and the bottom of the stairs. If you do not have them, ask someone to add them for you. What else can I do to help prevent falls? Wear shoes that: Do not have high heels. Have rubber bottoms. Are comfortable and fit you well. Are closed at the toe. Do not wear sandals. If you use a stepladder: Make sure that it is fully opened. Do not climb a closed  stepladder. Make sure that both sides of the stepladder are locked into place. Ask someone to hold it for you, if possible. Clearly mark and make sure that you can see: Any grab bars or handrails. First and last steps. Where the edge of each step is. Use tools that help you move around (mobility aids) if they are needed. These include: Canes. Walkers. Scooters. Crutches. Turn on the lights when you go into a dark area. Replace any light bulbs as soon as they burn out. Set up your furniture so you have a clear path. Avoid moving your furniture around. If any of your floors are uneven, fix them. If there are any pets around you, be aware of where they are. Review your medicines with your doctor. Some medicines can make you feel dizzy. This can increase your chance of falling. Ask your doctor what other things that you can do to help prevent falls. This information is not intended to replace advice given to you by your health care provider. Make sure you discuss any questions you have with your health care provider. Document Released: 04/18/2009 Document Revised: 11/28/2015 Document Reviewed: 07/27/2014 Elsevier Interactive Patient Education  2017 [employer].

## 2022-01-19 NOTE — Progress Notes (Signed)
Subjective:   Tyler Barton is a 51 y.o. male who presents for Medicare Annual/Subsequent preventive examination.  Virtual Visit via Telephone Note  I connected with  Tyler Barton on 01/19/22 at  9:00 AM EDT by telephone and verified that I am speaking with the correct person using two identifiers.  Location: Patient: Home Provider: [facility] Persons participating in the virtual visit: patient/Nurse Health Advisor   I discussed the limitations, risks, security and privacy concerns of performing an evaluation and management service by telephone and the availability of in person appointments. The patient expressed understanding and agreed to proceed.  Interactive audio and video telecommunications were attempted between this nurse and patient, however failed, due to patient having technical difficulties OR patient did not have access to video capability.  We continued and completed visit with audio only.  Some vital signs may be absent or patient reported.   Tyler Everage E Avrie Kedzierski, LPN   Review of Systems     Cardiac Risk Factors include: hypertension;male gender;sedentary lifestyle;obesity (BMI >30kg/m2)     Objective:    Today's Vitals   01/19/22 0902 01/19/22 0903  Weight: 223 lb (101.2 kg)   PainSc:  4    Body mass index is 32.93 kg/m.     01/19/2022    9:06 AM 01/16/2021    9:24 AM 10/17/2019    8:43 AM 03/08/2018   11:07 AM  Advanced Directives  Does Patient Have a Medical Advance Directive? No No No No  Would patient like information on creating a medical advance directive? No - Patient declined No - Patient declined No - Patient declined Yes (MAU/Ambulatory/Procedural Areas - Information given)    Current Medications (verified) Outpatient Encounter Medications as of 01/19/2022  Medication Sig   buPROPion (WELLBUTRIN XL) 150 MG 24 hr tablet Take 1 tablet (150 mg total) by mouth daily.   celecoxib (CELEBREX) 200 MG capsule Take 1 capsule (200 mg total) by mouth 2 (two) times  daily.   diazepam (VALIUM) 5 MG tablet Take 1 tablet (5 mg total) by mouth every 12 (twelve) hours as needed. for anxiety   famotidine (PEPCID) 20 MG tablet Take 1 tablet (20 mg total) by mouth 2 (two) times daily.   fluticasone (FLONASE) 50 MCG/ACT nasal spray Place 2 sprays into both nostrils daily.   lisinopril (ZESTRIL) 10 MG tablet Take 1 tablet (10 mg total) by mouth daily.   loratadine (EQ ALLERGY RELIEF) 10 MG tablet Take 1 tablet (10 mg total) by mouth daily.   sildenafil (VIAGRA) 50 MG tablet Take 1 tablet (50 mg total) by mouth daily as needed for erectile dysfunction.   No facility-administered encounter medications on file as of 01/19/2022.    Allergies (verified) Patient has no known allergies.   History: Past Medical History:  Diagnosis Date   Anxiety    COVID-19 virus infection 04/2020   a. Ss onset 04/23/2020.   DDD (degenerative disc disease)    Morbid obesity (HCC)    Past Surgical History:  Procedure Laterality Date   KNEE ARTHROSCOPY Left 1993   LIPOMA EXCISION     Family History  Problem Relation Age of Onset   Anxiety disorder Mother    Anxiety disorder Father    Heart disease Father 32       with stent placement and bypass   Anxiety disorder Sister    Cancer Maternal Uncle        throat   Alzheimer's disease Maternal Grandmother    Cancer Paternal  Grandmother    Heart disease Maternal Uncle    Heart disease Maternal Uncle    Social History   Socioeconomic History   Marital status: Married    Spouse name: Tyler Barton   Number of children: 4   Years of education: 10   Highest education level: 10th grade  Occupational History   Occupation: disabled    Comment: truck driver  Tobacco Use   Smoking status: Never   Smokeless tobacco: Never  Vaping Use   Vaping Use: Never used  Substance and Sexual Activity   Alcohol use: Yes    Comment: ocassionally - rare   Drug use: No   Sexual activity: Yes  Other Topics Concern   Not on file  Social  History Narrative   Tyler Barton resides locally in [place] with his wife and step son. He has four children of his own and one was adopted because he had the child at the early age of 72. He has two step children. 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 He enjoys working in his shop and piddling with cars. He has recently been diagnosed with a hernia and this is keeping him from doing anything strenuous.    Social Determinants of Health   Financial Resource Strain: Low Risk  (01/19/2022)   Overall Financial Resource Strain (CARDIA)    Difficulty of Paying Living Expenses: Not very hard  Food Insecurity: No Food Insecurity (01/19/2022)   Hunger Vital Sign    Worried About Running Out of Food in the Last Year: Never true    Ran Out of Food in the Last Year: Never true  Transportation Needs: No Transportation Needs (01/19/2022)   PRAPARE - Hydrologist (Medical): No    Lack of Transportation (Non-Medical): No  Physical Activity: Inactive (01/19/2022)   Exercise Vital Sign    Days of Exercise per Week: 0 days    Minutes of Exercise per Session: 0 min  Stress: Stress Concern Present (01/19/2022)   [place]    Feeling of Stress : Rather much  Social Connections: Moderately Isolated (01/19/2022)   Social Connection and Isolation Panel [NHANES]    Frequency of Communication with Friends and Family: More than three times a week    Frequency of Social Gatherings with Friends and Family: Twice a week    Attends Religious Services: Never    Marine scientist or Organizations: No    Attends Music therapist: Never    Marital Status: Married    Tobacco Counseling Counseling given: Not Answered   Clinical Intake:  Pre-visit preparation completed: Yes  Pain : 0-10 Pain Score: 4  Pain Type: Chronic pain Pain Location: Hip Pain Orientation: Right, Left Pain Descriptors /  Indicators: Aching Pain Onset: More than a month ago Pain Frequency: Intermittent     BMI - recorded: 32.93 Nutritional Status: BMI > 30  Obese Nutritional Risks: None Diabetes: No  How often do you need to have someone help you when you read instructions, pamphlets, or other written materials from your doctor or pharmacy?: 1 - Never  Diabetic? no  Interpreter Needed?: No  Information entered by :: Leslieanne Cobarrubias, LPN   Activities of Daily Living    01/19/2022    9:06 AM  In your present state of health, do you have any difficulty performing the following activities:  Hearing? 0  Vision? 0  Difficulty concentrating or  making decisions? 0  Walking or climbing stairs? 0  Dressing or bathing? 0  Doing errands, shopping? 0  Preparing Food and eating ? N  Using the Toilet? N  In the past six months, have you accidently leaked urine? N  Do you have problems with loss of bowel control? N  Managing your Medications? N  Managing your Finances? N  Housekeeping or managing your Housekeeping? N    Patient Care Team: Chevis Pretty, FNP as PCP - General (Family Medicine) Ilean China, RN as Registered Nurse  Indicate any recent Medical Services you may have received from other than [facility] providers in the past year (date may be approximate).     Assessment:   This is a routine wellness examination for [place].  Hearing/Vision screen Hearing Screening - Comments:: Denies hearing difficulties   Vision Screening - Comments:: Mild vision concerns - difficulty reading fine print - no optometrist at this time - will likely go to [facility] soon  Dietary issues and exercise activities discussed: Current Exercise Habits: The patient does not participate in regular exercise at present, Exercise limited by: orthopedic condition(s)   Goals Addressed             This Visit's Progress    Exercise 150 min/wk Moderate Activity   Not on track    Manage My  Cholesterol   Not on track    Timeframe:  Long-Range Goal Priority:  High Start Date:                             Expected End Date:                       Follow Up Date 01/17/22    - change to whole grain breads, cereal, pasta - eat smaller or less servings of red meat - fill half the plate with nonstarchy vegetables - read food labels for fat and fiber    Why is this important?   Changing cholesterol starts with eating heart-healthy foods.  Other steps may be to increase your activity and to quit if you smoke.        Depression Screen    01/19/2022    9:11 AM 01/13/2022    8:45 AM 10/21/2021    8:03 AM 07/18/2021    8:07 AM 06/09/2021   12:11 PM 01/16/2021    9:06 AM 01/14/2021    8:25 AM  PHQ 2/9 Scores  PHQ - 2 Score '1 1 2 '$ 0 1 0 0  PHQ- 9 Score '4 4 7 4 8 4 4    '$ Fall Risk    01/19/2022    9:03 AM 01/13/2022    8:45 AM 10/21/2021    8:03 AM 07/18/2021    8:07 AM 06/09/2021   12:11 PM  Fall Risk   Falls in the past year? 0 0 0 0 0  Number falls in past yr: 0      Injury with Fall? 0      Risk for fall due to : Orthopedic patient      Follow up Falls prevention discussed        [place]:  Any stairs in or around the home? No  If so, are there any without handrails? Yes  - 2 steps on front porch, no rail - he has no problems with them Home free of loose throw  rugs in walkways, pet beds, electrical cords, etc? Yes  Adequate lighting in your home to reduce risk of falls? Yes   ASSISTIVE DEVICES UTILIZED TO PREVENT FALLS:  Life alert? No  Use of a cane, walker or w/c? No  Grab bars in the bathroom? No  Shower chair or bench in shower? No  Elevated toilet seat or a handicapped toilet? No   TIMED UP AND GO:  Was the test performed? No . Telephonic visit  Cognitive Function:    03/08/2018    5:01 PM  MMSE - Mini Mental State Exam  Not completed: Unable to complete        01/19/2022    9:07 AM 10/17/2019    8:44 AM  6CIT  Screen  What Year? 0 points 0 points  What month? 0 points 0 points  What time? 0 points 0 points  Count back from 20 0 points 0 points  Months in reverse 0 points 0 points  Repeat phrase 0 points 0 points  Total Score 0 points 0 points    Immunizations  There is no immunization history on file for this patient.  TDAP status: Due, Education has been provided regarding the importance of this vaccine. Advised may receive this vaccine at local pharmacy or Health Dept. Aware to provide a copy of the vaccination record if obtained from local pharmacy or Health Dept. Verbalized acceptance and understanding.  Flu Vaccine status: Declined, Education has been provided regarding the importance of this vaccine but patient still declined. Advised may receive this vaccine at local pharmacy or Health Dept. Aware to provide a copy of the vaccination record if obtained from local pharmacy or Health Dept. Verbalized acceptance and understanding.  Pneumococcal vaccine status: Declined,  Education has been provided regarding the importance of this vaccine but patient still declined. Advised may receive this vaccine at local pharmacy or Health Dept. Aware to provide a copy of the vaccination record if obtained from local pharmacy or Health Dept. Verbalized acceptance and understanding.   Covid-19 vaccine status: Declined, Education has been provided regarding the importance of this vaccine but patient still declined. Advised may receive this vaccine at local pharmacy or Health Dept.or vaccine clinic. Aware to provide a copy of the vaccination record if obtained from local pharmacy or Health Dept. Verbalized acceptance and understanding.  Qualifies for Shingles Vaccine? Yes   Zostavax completed No   Shingrix Completed?: No.    Education has been provided regarding the importance of this vaccine. Patient has been advised to call insurance company to determine out of pocket expense if they have not yet received  this vaccine. Advised may also receive vaccine at local pharmacy or Health Dept. Verbalized acceptance and understanding.  Screening Tests Health Maintenance  Topic Date Due   Zoster Vaccines- Shingrix (1 of 2) 01/20/2022 (Originally 03/13/2020)   COVID-19 Vaccine (1) 01/29/2022 (Originally 09/11/1971)   TETANUS/TDAP  06/09/2022 (Originally 03/06/2021)   INFLUENZA VACCINE  02/03/2022   Fecal DNA (Cologuard)  10/28/2024   Hepatitis C Screening  Completed   HIV Screening  Completed   HPV VACCINES  Aged Out    Health Maintenance  There are no preventive care reminders to display for this patient.  Colorectal cancer screening: Type of screening: Cologuard. Completed 10/28/2021. Repeat every 3 years  Lung Cancer Screening: (Low Dose CT Chest recommended if Age 56-80 years, 30 pack-year currently smoking OR have quit w/in 15years.) does not qualify.  Additional Screening:  Hepatitis C Screening: does qualify; Completed  08/11/2016  Vision Screening: Recommended annual ophthalmology exams for early detection of glaucoma and other disorders of the eye. Is the patient up to date with their annual eye exam?  No  Who is the provider or what is the name of the office in which the patient attends annual eye exams? None - likely going to go to [place] If pt is not established with a provider, would they like to be referred to a provider to establish care? No .   Dental Screening: Recommended annual dental exams for proper oral hygiene  Community Resource Referral / Chronic Care Management: CRR required this visit?  No   CCM required this visit?  No      Plan:     I have personally reviewed and noted the following in the patient's chart:   Medical and social history Use of alcohol, tobacco or illicit drugs  Current medications and supplements including opioid prescriptions. Patient is not currently taking opioid prescriptions. Functional ability and status Nutritional  status Physical activity Advanced directives List of other physicians Hospitalizations, surgeries, and ER visits in previous 12 months Vitals Screenings to include cognitive, depression, and falls Referrals and appointments  In addition, I have reviewed and discussed with patient certain preventive protocols, quality metrics, and best practice recommendations. A written personalized care plan for preventive services as well as general preventive health recommendations were provided to patient.     Sandrea Hammond, LPN   2/80/0349   Nurse Notes: He doesn't have prescription drug coverage and doesn't know how to get it; says insurance confuses him - I gave him phone number to [place] through [place] for assistance

## 2022-02-06 ENCOUNTER — Encounter: Payer: Self-pay | Admitting: Nurse Practitioner

## 2022-02-06 ENCOUNTER — Ambulatory Visit (INDEPENDENT_AMBULATORY_CARE_PROVIDER_SITE_OTHER): Payer: Medicare Other | Admitting: Nurse Practitioner

## 2022-02-06 VITALS — BP 115/76 | HR 70 | Temp 97.9°F | Resp 20 | Ht 69.0 in | Wt 217.0 lb

## 2022-02-06 DIAGNOSIS — N521 Erectile dysfunction due to diseases classified elsewhere: Secondary | ICD-10-CM | POA: Diagnosis not present

## 2022-02-06 DIAGNOSIS — H6981 Other specified disorders of Eustachian tube, right ear: Secondary | ICD-10-CM

## 2022-02-06 MED ORDER — TADALAFIL 10 MG PO TABS: 10.0000 mg | ORAL_TABLET | Freq: Every day | ORAL | 1 refills | Status: DC | PRN

## 2022-02-06 NOTE — Patient Instructions (Signed)
Eustachian Tube Dysfunction  Eustachian tube dysfunction refers to a condition in which a blockage develops in the narrow passage that connects the middle ear to the back of the nose (eustachian tube). The eustachian tube regulates air pressure in the middle ear by letting air move between the ear and nose. It also helps to drain fluid from the middle ear space. Eustachian tube dysfunction can affect one or both ears. When the eustachian tube does not function properly, air pressure, fluid, or both can build up in the middle ear. What are the causes? This condition occurs when the eustachian tube becomes blocked or cannot open normally. Common causes of this condition include: Ear infections. Colds and other infections that affect the nose, mouth, and throat (upper respiratory tract). Allergies. Irritation from cigarette smoke. Irritation from stomach acid coming up into the esophagus (gastroesophageal reflux). The esophagus is the part of the body that moves food from the mouth to the stomach. Sudden changes in air pressure, such as from descending in an airplane or scuba diving. Abnormal growths in the nose or throat, such as: Growths that line the nose (nasal polyps). Abnormal growth of cells (tumors). Enlarged tissue at the back of the throat (adenoids). What increases the risk? You are more likely to develop this condition if: You smoke. You are overweight. You are a child who has: Certain birth defects of the mouth, such as cleft palate. Large tonsils or adenoids. What are the signs or symptoms? Common symptoms of this condition include: A feeling of fullness in the ear. Ear pain. Clicking or popping noises in the ear. Ringing in the ear (tinnitus). Hearing loss. Loss of balance. Dizziness. Symptoms may get worse when the air pressure around you changes, such as when you travel to an area of high elevation, fly on an airplane, or go scuba diving. How is this diagnosed? This  condition may be diagnosed based on: Your symptoms. A physical exam of your ears, nose, and throat. Tests, such as those that measure: The movement of your eardrum. Your hearing (audiometry). How is this treated? Treatment depends on the cause and severity of your condition. In mild cases, you may relieve your symptoms by moving air into your ears. This is called "popping the ears." In more severe cases, or if you have symptoms of fluid in your ears, treatment may include: Medicines to relieve congestion (decongestants). Medicines that treat allergies (antihistamines). Nasal sprays or ear drops that contain medicines that reduce swelling (steroids). A procedure to drain the fluid in your eardrum. In this procedure, a small tube may be placed in the eardrum to: Drain the fluid. Restore the air in the middle ear space. A procedure to insert a balloon device through the nose to inflate the opening of the eustachian tube (balloon dilation). Follow these instructions at home: Lifestyle Do not do any of the following until your health care provider approves: Travel to high altitudes. Fly in airplanes. Work in a pressurized cabin or room. Scuba dive. Do not use any products that contain nicotine or tobacco. These products include cigarettes, chewing tobacco, and vaping devices, such as e-cigarettes. If you need help quitting, ask your health care provider. Keep your ears dry. Wear fitted earplugs during showering and bathing. Dry your ears completely after. General instructions Take over-the-counter and prescription medicines only as told by your health care provider. Use techniques to help pop your ears as recommended by your health care provider. These may include: Chewing gum. Yawning. Frequent, forceful swallowing.   Closing your mouth, holding your nose closed, and gently blowing as if you are trying to blow air out of your nose. Keep all follow-up visits. This is important. Contact a  health care provider if: Your symptoms do not go away after treatment. Your symptoms come back after treatment. You are unable to pop your ears. You have: A fever. Pain in your ear. Pain in your head or neck. Fluid draining from your ear. Your hearing suddenly changes. You become very dizzy. You lose your balance. Get help right away if: You have a sudden, severe increase in any of your symptoms. Summary Eustachian tube dysfunction refers to a condition in which a blockage develops in the eustachian tube. It can be caused by ear infections, allergies, inhaled irritants, or abnormal growths in the nose or throat. Symptoms may include ear pain or fullness, hearing loss, or ringing in the ears. Mild cases are treated with techniques to unblock the ears, such as yawning or chewing gum. More severe cases are treated with medicines or procedures. This information is not intended to replace advice given to you by your health care provider. Make sure you discuss any questions you have with your health care provider. Document Revised: 09/02/2020 Document Reviewed: 09/02/2020 Elsevier Patient Education  2023 Elsevier Inc.  

## 2022-02-06 NOTE — Progress Notes (Signed)
Subjective:    Patient ID: TAMIR WALLMAN, male    DOB: 02/27/1971, 51 y.o.   MRN: [id number]   Chief Complaint: Needs referral for ENT (Right ear stopped up)   HPI Patient comes in with 2 complaints; - right ear still feels stopped up. He has been complaining for several months. He has been using flonase nasal spray and it has not helped. He has also used a decongestant and no better.  - ED- is on viagra and it is not helping. He has doubled up and has been taking '100mg'$  and that is ot helping either.    Review of Systems  Constitutional:  Negative for diaphoresis.  Eyes:  Negative for pain.  Respiratory:  Negative for shortness of breath.   Cardiovascular:  Negative for chest pain, palpitations and leg swelling.  Gastrointestinal:  Negative for abdominal pain.  Endocrine: Negative for polydipsia.  Skin:  Negative for rash.  Neurological:  Negative for dizziness, weakness and headaches.  Hematological:  Does not bruise/bleed easily.  All other systems reviewed and are negative.      Objective:   Physical Exam Vitals and nursing note reviewed.  Constitutional:      Appearance: Normal appearance. He is well-developed.  HENT:     Right Ear: Tympanic membrane normal. There is impacted cerumen.     Left Ear: Tympanic membrane normal. There is no impacted cerumen.  Neck:     Thyroid: No thyroid mass or thyromegaly.     Vascular: No carotid bruit or JVD.     Trachea: Phonation normal.  Cardiovascular:     Rate and Rhythm: Normal rate and regular rhythm.  Pulmonary:     Effort: Pulmonary effort is normal. No respiratory distress.     Breath sounds: Normal breath sounds.  Abdominal:     General: Bowel sounds are normal.     Palpations: Abdomen is soft.     Tenderness: There is no abdominal tenderness.  Musculoskeletal:        General: Normal range of motion.     Cervical back: Normal range of motion and neck supple.  Lymphadenopathy:     Cervical: No cervical adenopathy.   Skin:    General: Skin is warm and dry.  Neurological:     Mental Status: He is alert and oriented to person, place, and time.  Psychiatric:        Behavior: Behavior normal.        Thought Content: Thought content normal.        Judgment: Judgment normal.    BP 115/76   Pulse 70   Temp 97.9 F (36.6 C) (Temporal)   Resp 20   Ht '5\' 9"'$  (1.753 m)   Wt 217 lb (98.4 kg)   SpO2 95%   BMI 32.05 kg/m   S/P right ear irrigation- TM clear      Assessment & Plan:   [name] in today with chief complaint of Needs referral for ENT (Right ear stopped up)   1. Erectile dysfunction due to diseases classified elsewhere Will give him cialis to take daily to see if helps while waiting on urology referral - Ambulatory referral to Urology - tadalafil (CIALIS) 10 MG tablet; Take 1 tablet (10 mg total) by mouth daily as needed for erectile dysfunction.  Dispense: 30 tablet; Refill: 1  2. Dysfunction of right eustachian tube Debrox 2-3 x a week. - Ambulatory referral to ENT    The above assessment and management plan was  discussed with the patient. The patient verbalized understanding of and has agreed to the management plan. Patient is aware to call the clinic if symptoms persist or worsen. Patient is aware when to return to the clinic for a follow-up visit. Patient educated on when it is appropriate to go to the emergency department.   Mary-Margaret Hassell Done, FNP

## 2022-02-23 ENCOUNTER — Ambulatory Visit: Payer: Medicare Other | Admitting: Urology

## 2022-02-23 DIAGNOSIS — N529 Male erectile dysfunction, unspecified: Secondary | ICD-10-CM

## 2022-02-23 NOTE — Progress Notes (Signed)
H&P  Chief Complaint: Erectile dysfunction  History of Present Illness: 51 year old male sent by Chevis Pretty, PA for evaluation and management of erectile dysfunction.  He does have a history of anxiety panic disorder.  He is on chronic diazepam more than a year ago he began having both obtaining and maintaining erections.  He states that the least bit of stimuli outside of the bed will cause him to lose his erection.  No history of penile injury during an course, no curvature or pain with erection.  He is on lisinopril.  He does not get regular cardiovascular exercise.  He feels like his libido is normal.  Past Medical History:  Diagnosis Date   Anxiety    COVID-19 virus infection 04/2020   a. Ss onset 04/23/2020.   DDD (degenerative disc disease)    Morbid obesity ([place])     Past Surgical History:  Procedure Laterality Date   KNEE ARTHROSCOPY Left 1993   LIPOMA EXCISION      Home Medications:  Allergies as of 02/24/2022   No Known Allergies      Medication List        Accurate as of February 23, 2022  7:39 PM. If you have any questions, ask your nurse or doctor.          buPROPion 150 MG 24 hr tablet Commonly known as: WELLBUTRIN XL Take 1 tablet (150 mg total) by mouth daily.   celecoxib 200 MG capsule Commonly known as: CeleBREX Take 1 capsule (200 mg total) by mouth 2 (two) times daily.   diazepam 5 MG tablet Commonly known as: VALIUM Take 1 tablet (5 mg total) by mouth every 12 (twelve) hours as needed. for anxiety   famotidine 20 MG tablet Commonly known as: PEPCID Take 1 tablet (20 mg total) by mouth 2 (two) times daily.   fluticasone 50 MCG/ACT nasal spray Commonly known as: FLONASE Place 2 sprays into both nostrils daily.   lisinopril 10 MG tablet Commonly known as: ZESTRIL Take 1 tablet (10 mg total) by mouth daily.   loratadine 10 MG tablet Commonly known as: EQ Allergy Relief Take 1 tablet (10 mg total) by mouth daily.   sildenafil 50  MG tablet Commonly known as: Viagra Take 1 tablet (50 mg total) by mouth daily as needed for erectile dysfunction.   tadalafil 10 MG tablet Commonly known as: CIALIS Take 1 tablet (10 mg total) by mouth daily as needed for erectile dysfunction.        Allergies: No Known Allergies  Family History  Problem Relation Age of Onset   Anxiety disorder Mother    Anxiety disorder Father    Heart disease Father 63       with stent placement and bypass   Anxiety disorder Sister    Cancer Maternal Uncle        throat   Alzheimer's disease Maternal Grandmother    Cancer Paternal Grandmother    Heart disease Maternal Uncle    Heart disease Maternal Uncle     Social History:  reports that he has never smoked. He has never used smokeless tobacco. He reports current alcohol use. He reports that he does not use drugs.  ROS: A complete review of systems was performed.  All systems are negative except for pertinent findings as noted.  Physical Exam:  Vital signs in last 24 hours: There were no vitals taken for this visit. Constitutional:  Alert and oriented, No acute distress Cardiovascular: Regular rate  Respiratory:  Normal respiratory effort GI: Abdomen is soft, nontender, nondistended, no abdominal masses. No CVAT.  Genitourinary: Normal male phallus, testes are descended bilaterally and non-tender and without masses, scrotum is normal in appearance without lesions or masses, perineum is normal on inspection. Lymphatic: No lymphadenopathy Neurologic: Grossly intact, no focal deficits Psychiatric: Normal mood and affect   I have reviewed notes from referring/previous physicians  I have reviewed urinalysis results   Impression/Assessment:  Erectile dysfunction.  May be partially vascular in nature, but he does have anxiety disorder  Delayed ejaculation, perhaps because of Wellbutrin  Plan:  1.  I did recommend that he get cardiovascular exercise on the right basis.  2.  He  is on 10 mg of Cialis daily.  I recommended just 5 mg a day  3.  Consider sex therapy as a couple.  I gave him the name of [facility] in [place]  4.  I let him know that Wellbutrin may retard his ejaculations  5.  I will have him come back in 2 months to recheck

## 2022-02-24 ENCOUNTER — Encounter: Payer: Self-pay | Admitting: Urology

## 2022-02-24 ENCOUNTER — Ambulatory Visit: Payer: Medicare Other | Admitting: Urology

## 2022-02-24 VITALS — BP 118/80 | HR 69

## 2022-02-24 DIAGNOSIS — N5201 Erectile dysfunction due to arterial insufficiency: Secondary | ICD-10-CM | POA: Diagnosis not present

## 2022-04-05 ENCOUNTER — Telehealth: Payer: Self-pay | Admitting: Nurse Practitioner

## 2022-04-05 DIAGNOSIS — M25551 Pain in right hip: Secondary | ICD-10-CM

## 2022-04-05 DIAGNOSIS — N521 Erectile dysfunction due to diseases classified elsewhere: Secondary | ICD-10-CM

## 2022-04-05 DIAGNOSIS — M25552 Pain in left hip: Secondary | ICD-10-CM

## 2022-04-10 MED ORDER — CELECOXIB 200 MG PO CAPS: 200.0000 mg | ORAL_CAPSULE | Freq: Two times a day (BID) | ORAL | 0 refills | Status: DC

## 2022-04-10 MED ORDER — SILDENAFIL CITRATE 50 MG PO TABS: 50.0000 mg | ORAL_TABLET | Freq: Every day | ORAL | 11 refills | Status: DC | PRN

## 2022-04-10 NOTE — Telephone Encounter (Signed)
Patient notified and verbalized understanding. 

## 2022-04-10 NOTE — Telephone Encounter (Signed)
Pt called to make sure that we received refill request from pharmacy. Pt says when he saw MMM at last visit she told him to contact us whenever he was almost out of his medicine and she would send more in for him.  Pt says he is almost out. Can covering provider approve refill?

## 2022-04-10 NOTE — Telephone Encounter (Signed)
Prescription sent to pharmacy.

## 2022-04-13 NOTE — Telephone Encounter (Signed)
Na- mailbox full

## 2022-04-13 NOTE — Telephone Encounter (Signed)
Pt called stating that the wrong Rx was sent to pharmacy. Says it should be CIALIS '10mg'$ .. Was told by pharmacy today that they still dont have it. Pt says he cant take the VIAGRA '50mg'$  Rx. It doesn't work for him.

## 2022-04-14 ENCOUNTER — Other Ambulatory Visit: Payer: Self-pay | Admitting: Nurse Practitioner

## 2022-04-14 DIAGNOSIS — N521 Erectile dysfunction due to diseases classified elsewhere: Secondary | ICD-10-CM

## 2022-04-14 MED ORDER — TADALAFIL 10 MG PO TABS: 10.0000 mg | ORAL_TABLET | Freq: Every day | ORAL | 0 refills | Status: DC | PRN

## 2022-04-21 NOTE — Telephone Encounter (Signed)
Sent to pharmacy 

## 2022-05-05 ENCOUNTER — Encounter: Payer: Self-pay | Admitting: Urology

## 2022-05-05 ENCOUNTER — Ambulatory Visit: Payer: Medicare Other | Admitting: Urology

## 2022-05-05 VITALS — BP 127/83 | HR 70

## 2022-05-05 DIAGNOSIS — N5201 Erectile dysfunction due to arterial insufficiency: Secondary | ICD-10-CM | POA: Diagnosis not present

## 2022-05-05 LAB — URINALYSIS, ROUTINE W REFLEX MICROSCOPIC
Bilirubin, UA: NEGATIVE
Glucose, UA: NEGATIVE
Ketones, UA: NEGATIVE
Leukocytes,UA: NEGATIVE
Nitrite, UA: NEGATIVE
Protein,UA: NEGATIVE
Specific Gravity, UA: 1.02 (ref 1.005–1.030)
Urobilinogen, Ur: 0.2 mg/dL (ref 0.2–1.0)
pH, UA: 6 (ref 5.0–7.5)

## 2022-05-05 LAB — MICROSCOPIC EXAMINATION
Bacteria, UA: NONE SEEN
Epithelial Cells (non renal): NONE SEEN /hpf (ref 0–10)

## 2022-05-05 MED ORDER — TADALAFIL 20 MG PO TABS: 20.0000 mg | ORAL_TABLET | ORAL | 3 refills | Status: DC | PRN

## 2022-05-05 NOTE — Progress Notes (Signed)
History of Present Illness: This man comes in today for follow-up of erectile dysfunction.  He does have significant anxiety/psychological issues.  It is felt that these are strongly tied to his problems with ED.  At his visit 2 months ago, I recommended couples therapy.  Additionally, he had been on 10 mg of tadalafil on a daily basis.  This was decreased to 5 mg.  He does have ejaculatory dysfunction and I did tell him that this was more than likely related to medical therapy.  He comes in today for follow-up.  He is getting adequate erections.  He has not sought sex therapy.  He takes the 10 mg of Cialis daily, even though at his first visit I told him to back down to half a tablet a day.  He is having no significant side effects from this.  Overall, he is fairly pleased with his sex outcomes recently.  Past Medical History:  Diagnosis Date   Anxiety    COVID-19 virus infection 04/2020   a. Ss onset 04/23/2020.   DDD (degenerative disc disease)    Morbid obesity ([place])     Past Surgical History:  Procedure Laterality Date   KNEE ARTHROSCOPY Left 1993   LIPOMA EXCISION      Home Medications:  Allergies as of 05/05/2022   No Known Allergies      Medication List        Accurate as of May 05, 2022  8:08 AM. If you have any questions, ask your nurse or doctor.          buPROPion 150 MG 24 hr tablet Commonly known as: WELLBUTRIN XL Take 1 tablet (150 mg total) by mouth daily.   celecoxib 200 MG capsule Commonly known as: CELEBREX Take 1 capsule (200 mg total) by mouth 2 (two) times daily.   diazepam 5 MG tablet Commonly known as: VALIUM Take 1 tablet (5 mg total) by mouth every 12 (twelve) hours as needed. for anxiety   famotidine 20 MG tablet Commonly known as: PEPCID Take 1 tablet (20 mg total) by mouth 2 (two) times daily.   fluticasone 50 MCG/ACT nasal spray Commonly known as: FLONASE Place 2 sprays into both nostrils daily.   lisinopril 10 MG  tablet Commonly known as: ZESTRIL Take 1 tablet (10 mg total) by mouth daily.   loratadine 10 MG tablet Commonly known as: EQ Allergy Relief Take 1 tablet (10 mg total) by mouth daily.   sildenafil 50 MG tablet Commonly known as: Viagra Take 1 tablet (50 mg total) by mouth daily as needed for erectile dysfunction.   tadalafil 10 MG tablet Commonly known as: CIALIS Take 1 tablet (10 mg total) by mouth daily as needed for erectile dysfunction.        Allergies: No Known Allergies  Family History  Problem Relation Age of Onset   Anxiety disorder Mother    Anxiety disorder Father    Heart disease Father 82       with stent placement and bypass   Anxiety disorder Sister    Cancer Maternal Uncle        throat   Alzheimer's disease Maternal Grandmother    Cancer Paternal Grandmother    Heart disease Maternal Uncle    Heart disease Maternal Uncle     Social History:  reports that he has never smoked. He has never used smokeless tobacco. He reports current alcohol use. He reports that he does not use drugs.  ROS: A complete review of  systems was performed.  All systems are negative except for pertinent findings as noted.  Physical Exam:  Vital signs in last 24 hours: There were no vitals taken for this visit. Constitutional:  Alert and oriented, No acute distress Cardiovascular: Regular rate  Respiratory: Normal respiratory effort Psychiatric: Normal mood and affect  I have reviewed prior pt notes  I have reviewed urinalysis results    Impression/Assessment:  1.  ED, most likely functional in nature.  Overall, he is doing well now.  He did not follow my advice to take the smaller dose of Cialis  2.  Ejaculatory disorder-he is on antidepressant.  He is doing better with this.  Plan:  1.  I let him know that he should not take his Cialis 10 mg a day.  At the most, I told him that he can take it every other day.  He was given a prescription for 20 mg, to take half  every other day.  He was only given enough for 90 days with with each prescription at this reduced dose.  2.  I will have him come back in a couple of years to recheck

## 2022-05-05 NOTE — Addendum Note (Signed)
Addended by: Audie Box on: 05/05/2022 09:44 AM   Modules accepted: Orders

## 2022-06-04 IMAGING — DX DG HIP (WITH OR WITHOUT PELVIS) 2-3V*R*
3 series · 3 of 3 positions shown · non-contrast
Comparison: None Available.

CLINICAL DATA: Right hip pain.

EXAM:
DG HIP (WITH OR WITHOUT PELVIS) 2-3V RIGHT

[pelvis ap]
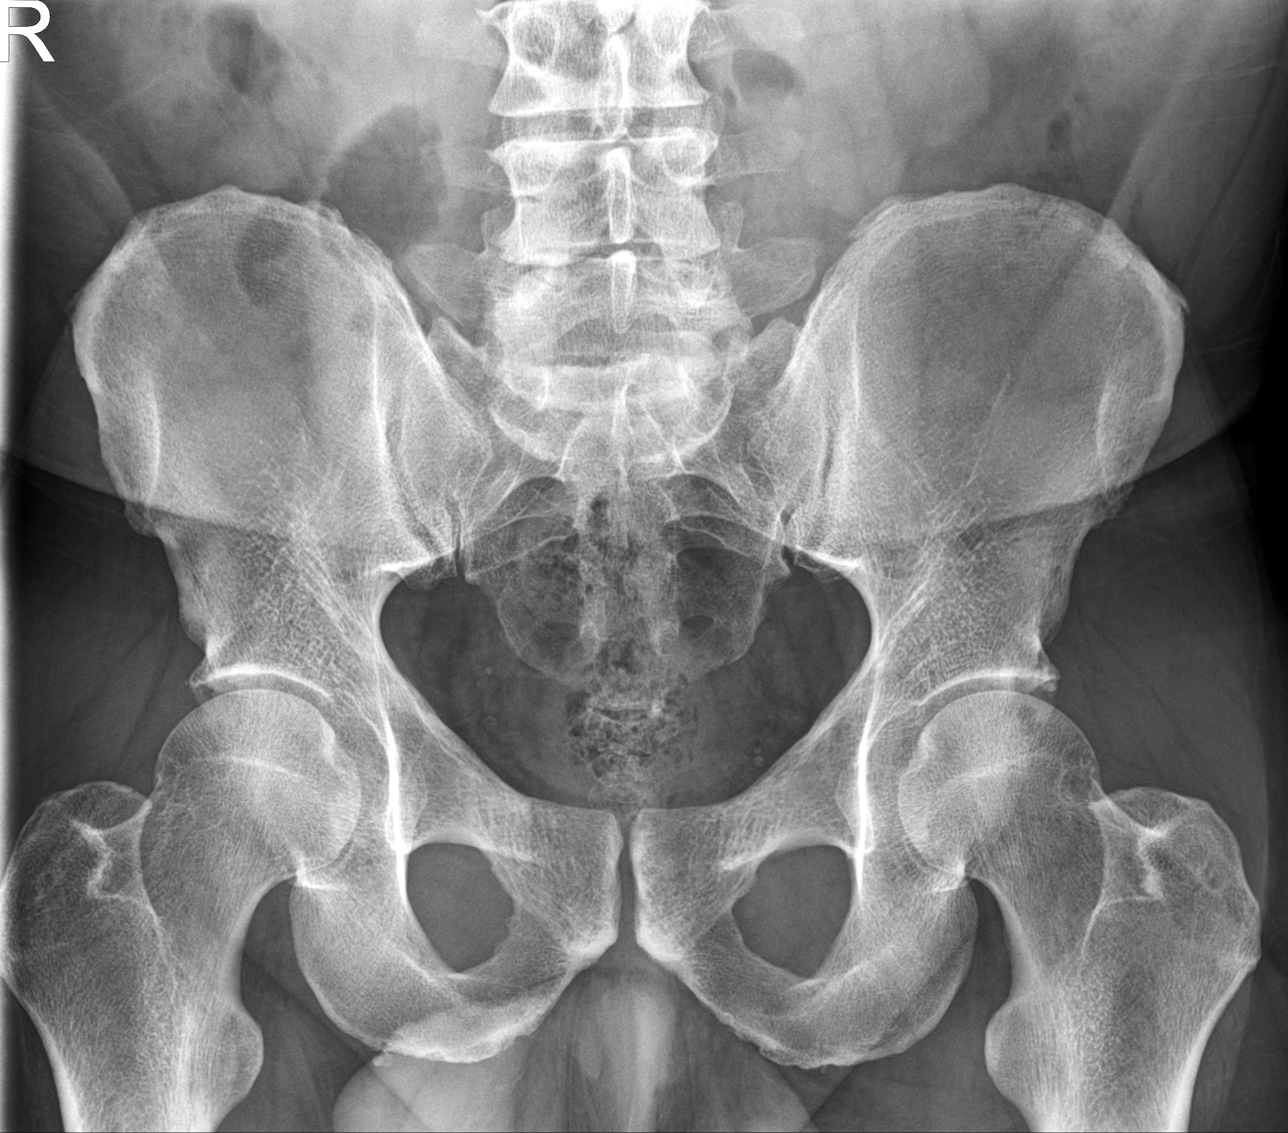

[hip ap]
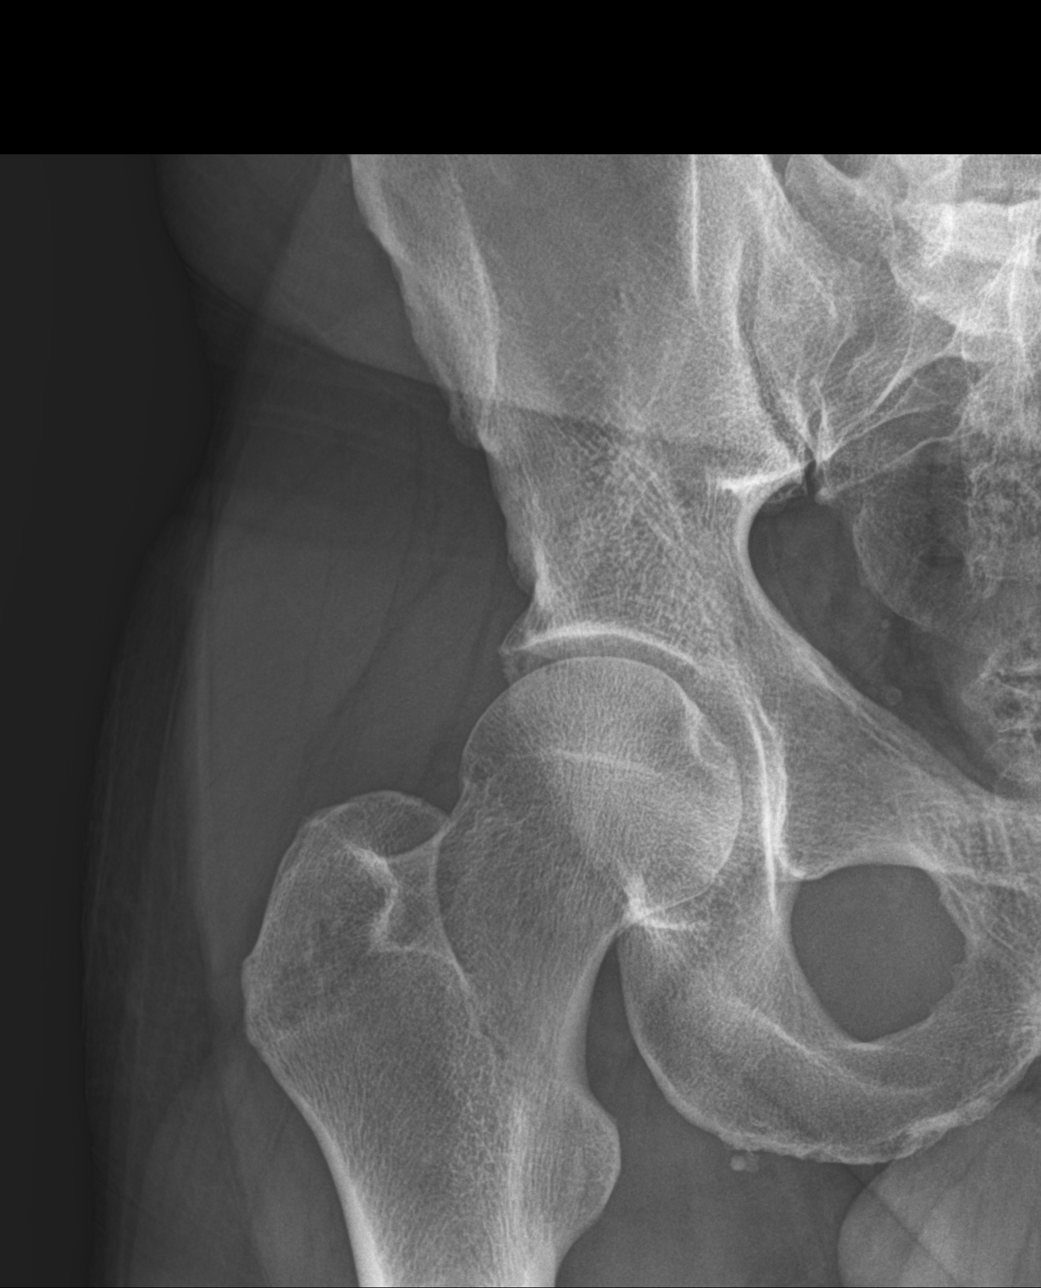

[hip lat]
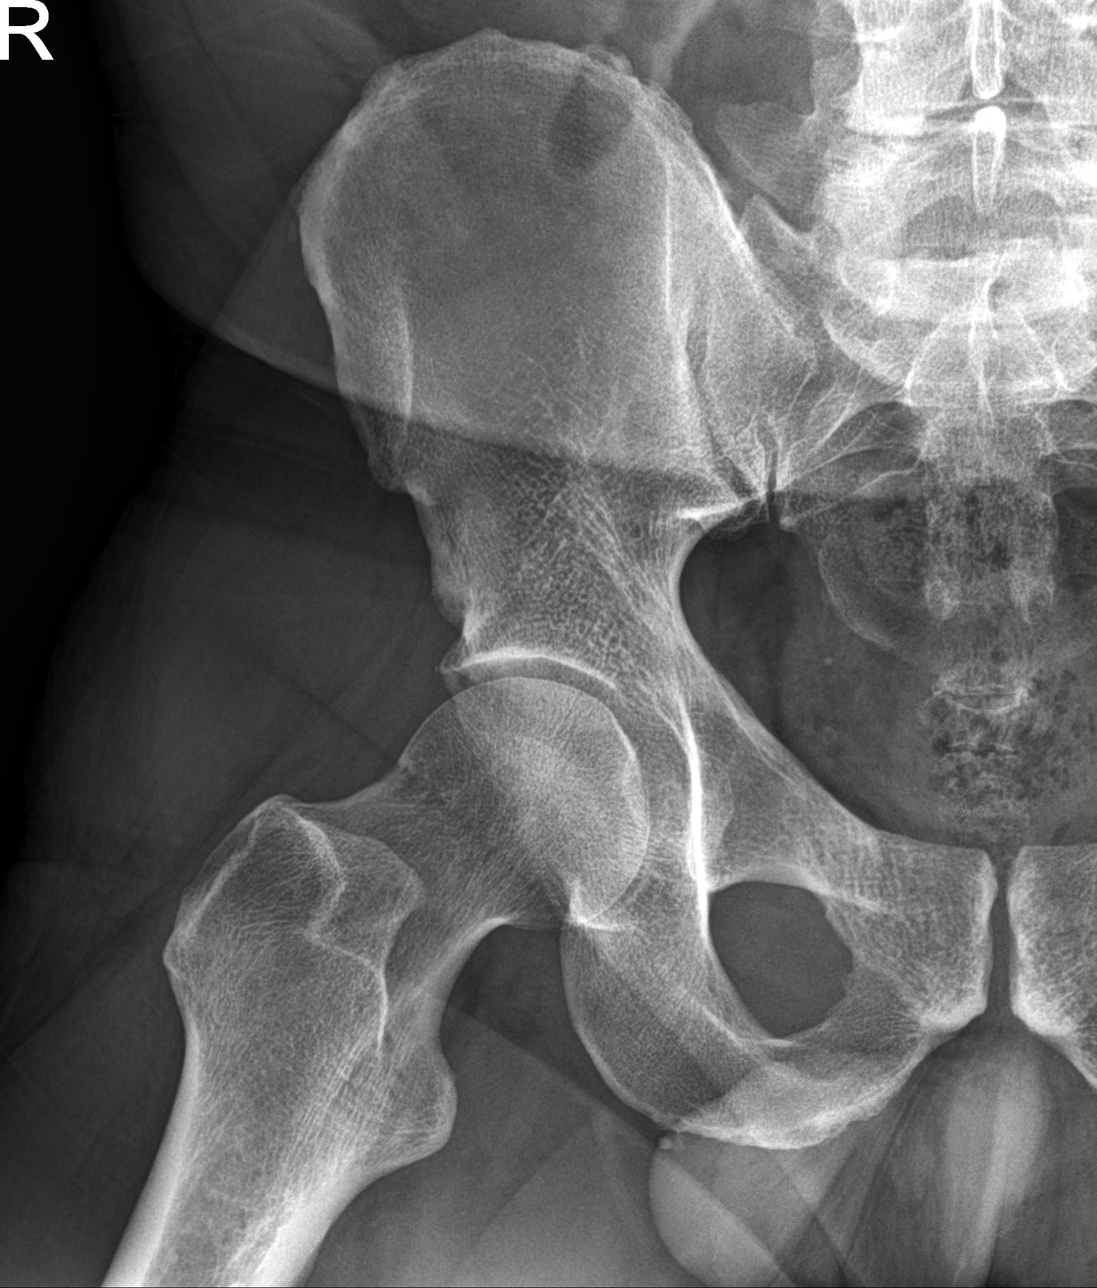

[3 of 3 positions shown; findings below may reference images not displayed]

FINDINGS: The bilateral sacroiliac common bilateral femoroacetabular and pubic
symphysis joint spaces are maintained. Mild superolateral left
acetabular degenerative ossicle versus osteophytosis. No acute
fracture or dislocation.
IMPRESSION: No significant right hip osteoarthritis.

## 2022-06-07 ENCOUNTER — Other Ambulatory Visit: Payer: Self-pay | Admitting: Family

## 2022-06-07 DIAGNOSIS — M25551 Pain in right hip: Secondary | ICD-10-CM

## 2022-07-01 ENCOUNTER — Encounter: Payer: Self-pay | Admitting: Nurse Practitioner

## 2022-07-01 ENCOUNTER — Telehealth (INDEPENDENT_AMBULATORY_CARE_PROVIDER_SITE_OTHER): Payer: Medicare Other | Admitting: Nurse Practitioner

## 2022-07-01 DIAGNOSIS — H6991 Unspecified Eustachian tube disorder, right ear: Secondary | ICD-10-CM | POA: Diagnosis not present

## 2022-07-01 MED ORDER — CHLORPHEN-PE-ACETAMINOPHEN 4-10-325 MG PO TABS: 1.0000 | ORAL_TABLET | Freq: Four times a day (QID) | ORAL | 0 refills | Status: DC | PRN

## 2022-07-01 NOTE — Progress Notes (Signed)
Virtual Visit Consent   Tyler Barton, you are scheduled for a virtual visit with Mary-Margaret Hassell Done, [place], a [facility] provider, today.     Just as with appointments in the office, your consent must be obtained to participate.  Your consent will be active for this visit and any virtual visit you may have with one of our providers in the next 365 days.     If you have a MyChart account, a copy of this consent can be sent to you electronically.  All virtual visits are billed to your insurance company just like a traditional visit in the office.    As this is a virtual visit, video technology does not allow for your provider to perform a traditional examination.  This may limit your provider's ability to fully assess your condition.  If your provider identifies any concerns that need to be evaluated in person or the need to arrange testing (such as labs, EKG, etc.), we will make arrangements to do so.     Although advances in technology are sophisticated, we cannot ensure that it will always work on either your end or our end.  If the connection with a video visit is poor, the visit may have to be switched to a telephone visit.  With either a video or telephone visit, we are not always able to ensure that we have a secure connection.     I need to obtain your verbal consent now.   Are you willing to proceed with your visit today? YES   CEASER EBELING has provided verbal consent on 07/01/2022 for a virtual visit (video or telephone).   Mary-Margaret Hassell Done, FNP   Date: 07/01/2022 11:32 AM   Virtual Visit via Video Note   I, Mary-Margaret Hassell Done, connected with Tyler Barton ([id number], Sep 02, 1970) on 07/01/22 at 11:35 AM EST by a video-enabled telemedicine application and verified that I am speaking with the correct person using two identifiers.  Location: Patient: Virtual Visit Location Patient: Home Provider: Virtual Visit Location Provider: Mobile   I discussed the limitations of  evaluation and management by telemedicine and the availability of in person appointments. The patient expressed understanding and agreed to proceed.    History of Present Illness: Tyler Barton is a 51 y.o. who identifies as a male who was assigned male at birth, and is being seen today for sinusitis.  HPI: Sinusitis This is a new problem. Episode onset: 3 days. The problem has been waxing and waning since onset. There has been no fever. Associated symptoms include congestion. Pertinent negatives include no coughing, headaches or sinus pressure. (Ear stopped up) Past treatments include nothing. The treatment provided mild relief.    Review of Systems  HENT:  Positive for congestion. Negative for sinus pressure.   Respiratory:  Negative for cough.   Neurological:  Negative for headaches.    Problems:  Patient Active Problem List   Diagnosis Date Noted   Primary hypertension 10/21/2021   Anxiety    Morbid obesity ([place])    Chronic pain of left knee 11/01/2017   Recurrent major depressive disorder, in full remission ([place]) 05/07/2017   IBS (irritable bowel syndrome) 11/21/2014   Panic disorder 03/14/2013    Allergies: No Known Allergies Medications:  Current Outpatient Medications:    buPROPion (WELLBUTRIN XL) 150 MG 24 hr tablet, Take 1 tablet (150 mg total) by mouth daily., Disp: 90 tablet, Rfl: 1   celecoxib (CELEBREX) 200 MG capsule, Take 1 capsule by  mouth twice daily, Disp: 60 capsule, Rfl: 0   diazepam (VALIUM) 5 MG tablet, Take 1 tablet (5 mg total) by mouth every 12 (twelve) hours as needed. for anxiety, Disp: 60 tablet, Rfl: 5   famotidine (PEPCID) 20 MG tablet, Take 1 tablet (20 mg total) by mouth 2 (two) times daily., Disp: 180 tablet, Rfl: 1   fluticasone (FLONASE) 50 MCG/ACT nasal spray, Place 2 sprays into both nostrils daily., Disp: 16 g, Rfl: 5   lisinopril (ZESTRIL) 10 MG tablet, Take 1 tablet (10 mg total) by mouth daily., Disp: 90 tablet, Rfl: 1   loratadine (EQ  ALLERGY RELIEF) 10 MG tablet, Take 1 tablet (10 mg total) by mouth daily., Disp: 30 tablet, Rfl: 5   sildenafil (VIAGRA) 50 MG tablet, Take 1 tablet (50 mg total) by mouth daily as needed for erectile dysfunction., Disp: 10 tablet, Rfl: 11   tadalafil (CIALIS) 10 MG tablet, Take 1 tablet (10 mg total) by mouth daily as needed for erectile dysfunction., Disp: 30 tablet, Rfl: 0   tadalafil (CIALIS) 20 MG tablet, Take 1 tablet (20 mg total) by mouth as needed for erectile dysfunction., Disp: 45 tablet, Rfl: 3  Observations/Objective: Patient is well-developed, well-nourished in no acute distress.  Resting comfortably  at home.  Head is normocephalic, atraumatic.  No labored breathing.  Speech is clear and coherent with logical content.  Patient is alert and oriented at baseline.    Assessment and Plan:  Tyler Barton in today with chief complaint of Sinusitis   1. Eustachian tube dysfunction, right Continue flonase nasal spray Force fluids Rest Motrin or tylenol for pain  Meds ordered this encounter  Medications   Chlorphen-PE-Acetaminophen 4-10-325 MG TABS    Sig: Take 1 tablet by mouth every 6 (six) hours as needed.    Dispense:  20 tablet    Refill:  0    Order Specific Question:   Supervising Provider    Answer:   Caryl Pina A [[id number]]       Follow Up Instructions: I discussed the assessment and treatment plan with the patient. The patient was provided an opportunity to ask questions and all were answered. The patient agreed with the plan and demonstrated an understanding of the instructions.  A copy of instructions were sent to the patient via MyChart.  The patient was advised to call back or seek an in-person evaluation if the symptoms worsen or if the condition fails to improve as anticipated.  Time:  I spent 5 minutes with the patient via telehealth technology discussing the above problems/concerns.    Mary-Margaret Hassell Done, FNP

## 2022-07-01 NOTE — Patient Instructions (Signed)
Eustachian Tube Dysfunction  Eustachian tube dysfunction refers to a condition in which a blockage develops in the narrow passage that connects the middle ear to the back of the nose (eustachian tube). The eustachian tube regulates air pressure in the middle ear by letting air move between the ear and nose. It also helps to drain fluid from the middle ear space. Eustachian tube dysfunction can affect one or both ears. When the eustachian tube does not function properly, air pressure, fluid, or both can build up in the middle ear. What are the causes? This condition occurs when the eustachian tube becomes blocked or cannot open normally. Common causes of this condition include: Ear infections. Colds and other infections that affect the nose, mouth, and throat (upper respiratory tract). Allergies. Irritation from cigarette smoke. Irritation from stomach acid coming up into the esophagus (gastroesophageal reflux). The esophagus is the part of the body that moves food from the mouth to the stomach. Sudden changes in air pressure, such as from descending in an airplane or scuba diving. Abnormal growths in the nose or throat, such as: Growths that line the nose (nasal polyps). Abnormal growth of cells (tumors). Enlarged tissue at the back of the throat (adenoids). What increases the risk? You are more likely to develop this condition if: You smoke. You are overweight. You are a child who has: Certain birth defects of the mouth, such as cleft palate. Large tonsils or adenoids. What are the signs or symptoms? Common symptoms of this condition include: A feeling of fullness in the ear. Ear pain. Clicking or popping noises in the ear. Ringing in the ear (tinnitus). Hearing loss. Loss of balance. Dizziness. Symptoms may get worse when the air pressure around you changes, such as when you travel to an area of high elevation, fly on an airplane, or go scuba diving. How is this diagnosed? This  condition may be diagnosed based on: Your symptoms. A physical exam of your ears, nose, and throat. Tests, such as those that measure: The movement of your eardrum. Your hearing (audiometry). How is this treated? Treatment depends on the cause and severity of your condition. In mild cases, you may relieve your symptoms by moving air into your ears. This is called "popping the ears." In more severe cases, or if you have symptoms of fluid in your ears, treatment may include: Medicines to relieve congestion (decongestants). Medicines that treat allergies (antihistamines). Nasal sprays or ear drops that contain medicines that reduce swelling (steroids). A procedure to drain the fluid in your eardrum. In this procedure, a small tube may be placed in the eardrum to: Drain the fluid. Restore the air in the middle ear space. A procedure to insert a balloon device through the nose to inflate the opening of the eustachian tube (balloon dilation). Follow these instructions at home: Lifestyle Do not do any of the following until your health care provider approves: Travel to high altitudes. Fly in airplanes. Work in a pressurized cabin or room. Scuba dive. Do not use any products that contain nicotine or tobacco. These products include cigarettes, chewing tobacco, and vaping devices, such as e-cigarettes. If you need help quitting, ask your health care provider. Keep your ears dry. Wear fitted earplugs during showering and bathing. Dry your ears completely after. General instructions Take over-the-counter and prescription medicines only as told by your health care provider. Use techniques to help pop your ears as recommended by your health care provider. These may include: Chewing gum. Yawning. Frequent, forceful swallowing.   Closing your mouth, holding your nose closed, and gently blowing as if you are trying to blow air out of your nose. Keep all follow-up visits. This is important. Contact a  health care provider if: Your symptoms do not go away after treatment. Your symptoms come back after treatment. You are unable to pop your ears. You have: A fever. Pain in your ear. Pain in your head or neck. Fluid draining from your ear. Your hearing suddenly changes. You become very dizzy. You lose your balance. Get help right away if: You have a sudden, severe increase in any of your symptoms. Summary Eustachian tube dysfunction refers to a condition in which a blockage develops in the eustachian tube. It can be caused by ear infections, allergies, inhaled irritants, or abnormal growths in the nose or throat. Symptoms may include ear pain or fullness, hearing loss, or ringing in the ears. Mild cases are treated with techniques to unblock the ears, such as yawning or chewing gum. More severe cases are treated with medicines or procedures. This information is not intended to replace advice given to you by your health care provider. Make sure you discuss any questions you have with your health care provider. Document Revised: 09/02/2020 Document Reviewed: 09/02/2020 Elsevier Patient Education  2023 Elsevier Inc.  

## 2022-07-17 ENCOUNTER — Encounter: Payer: Self-pay | Admitting: Nurse Practitioner

## 2022-07-17 ENCOUNTER — Ambulatory Visit (INDEPENDENT_AMBULATORY_CARE_PROVIDER_SITE_OTHER): Payer: Medicare Other | Admitting: Nurse Practitioner

## 2022-07-17 VITALS — BP 140/82 | HR 65 | Temp 97.6°F | Resp 20 | Ht 69.0 in | Wt 228.0 lb

## 2022-07-17 DIAGNOSIS — Z6833 Body mass index (BMI) 33.0-33.9, adult: Secondary | ICD-10-CM

## 2022-07-17 DIAGNOSIS — H6121 Impacted cerumen, right ear: Secondary | ICD-10-CM

## 2022-07-17 DIAGNOSIS — F3342 Major depressive disorder, recurrent, in full remission: Secondary | ICD-10-CM

## 2022-07-17 DIAGNOSIS — F410 Panic disorder [episodic paroxysmal anxiety] without agoraphobia: Secondary | ICD-10-CM | POA: Diagnosis not present

## 2022-07-17 DIAGNOSIS — I10 Essential (primary) hypertension: Secondary | ICD-10-CM

## 2022-07-17 DIAGNOSIS — K582 Mixed irritable bowel syndrome: Secondary | ICD-10-CM

## 2022-07-17 DIAGNOSIS — F419 Anxiety disorder, unspecified: Secondary | ICD-10-CM

## 2022-07-17 LAB — LIPID PANEL

## 2022-07-17 MED ORDER — LISINOPRIL 10 MG PO TABS: 10.0000 mg | ORAL_TABLET | Freq: Every day | ORAL | 1 refills | Status: DC

## 2022-07-17 MED ORDER — DIAZEPAM 5 MG PO TABS: 5.0000 mg | ORAL_TABLET | Freq: Two times a day (BID) | ORAL | 5 refills | Status: DC | PRN

## 2022-07-17 MED ORDER — BUPROPION HCL ER (XL) 150 MG PO TB24: 150.0000 mg | ORAL_TABLET | Freq: Every day | ORAL | 1 refills | Status: DC

## 2022-07-17 MED ORDER — FAMOTIDINE 20 MG PO TABS: 20.0000 mg | ORAL_TABLET | Freq: Two times a day (BID) | ORAL | 1 refills | Status: DC

## 2022-07-17 NOTE — Progress Notes (Addendum)
Subjective:    Patient ID: Tyler Barton, male    DOB: 07/20/1970, 52 y.o.   MRN: [id number]   Chief Complaint: medical management of chronic issues     HPI:  Tyler Barton is a 52 y.o. who identifies as a male who was assigned male at birth.   Social history: Lives with: wife Work history: disability   Comes in today for follow up of the following chronic medical issues:  1. Primary hypertension No c/o chest pain, sob or headache. Does not check blood pressure at home. BP Readings from Last 3 Encounters:  05/05/22 127/83  02/24/22 118/80  02/06/22 115/76     2. Irritable bowel syndrome with both constipation and diarrhea Takes pepcid but no other intestinal meds. No diarrhea or constipation.  3. Panic disorder 4. Anxiety Is on valium BID. He has tried several other meds and this one seems to work the best. Last week he had a panic attack. He had went outside and cold air hit his face and he got to coughing. Phlegm got in his throat and he just went into a full blown panic attack. Had to take an extra diazepam. He drove hisself to the ED and sat outside for almost 2 hours, but never went in to be seen.    07/17/2022    8:11 AM 02/06/2022    8:27 AM 01/13/2022    8:46 AM 10/21/2021    8:03 AM  GAD 7 : Generalized Anxiety Score  Nervous, Anxious, on Edge '1 1 1 2  '$ Control/stop worrying '1 1 2 3  '$ Worry too much - different things '1 1 2 2  '$ Trouble relaxing 1 0 1 2  Restless 0 0 1 1  Easily annoyed or irritable 0 0 1 1  Afraid - awful might happen 0 0 1 1  Total GAD 7 Score '4 3 9 12  '$ Anxiety Difficulty Not difficult at all Somewhat difficult Somewhat difficult Somewhat difficult       5. Recurrent major depressive disorder, in full remission ([place]) Is on wellbutrin and is doing ok. Other antidepressants caused side effects that he did not like.    02/06/2022    8:27 AM 01/19/2022    9:11 AM 01/13/2022    8:45 AM  Depression screen PHQ 2/9  Decreased Interest 0 1 1   Down, Depressed, Hopeless 0 0 0  PHQ - 2 Score 0 1 1  Altered sleeping 1 0 0  Tired, decreased energy '1 1 1  '$ Change in appetite '1 1 1  '$ Feeling bad or failure about yourself  0 0 0  Trouble concentrating 0 1 1  Moving slowly or fidgety/restless 0 0 0  Suicidal thoughts 0 0 0  PHQ-9 Score '3 4 4  '$ Difficult doing work/chores Somewhat difficult Somewhat difficult Somewhat difficult    6. Morbid obesity ([place]) Weight is up 11lbs. Wt Readings from Last 3 Encounters:  07/17/22 228 lb (103.4 kg)  02/06/22 217 lb (98.4 kg)  01/19/22 223 lb (101.2 kg)   BMI Readings from Last 3 Encounters:  07/17/22 33.67 kg/m  02/06/22 32.05 kg/m  01/19/22 32.93 kg/m     New complaints: None new today  No Known Allergies Outpatient Encounter Medications as of 07/17/2022  Medication Sig   buPROPion (WELLBUTRIN XL) 150 MG 24 hr tablet Take 1 tablet (150 mg total) by mouth daily.   celecoxib (CELEBREX) 200 MG capsule Take 1 capsule by mouth twice daily   Chlorphen-PE-Acetaminophen 4-10-325 MG  TABS Take 1 tablet by mouth every 6 (six) hours as needed.   diazepam (VALIUM) 5 MG tablet Take 1 tablet (5 mg total) by mouth every 12 (twelve) hours as needed. for anxiety   famotidine (PEPCID) 20 MG tablet Take 1 tablet (20 mg total) by mouth 2 (two) times daily.   fluticasone (FLONASE) 50 MCG/ACT nasal spray Place 2 sprays into both nostrils daily.   lisinopril (ZESTRIL) 10 MG tablet Take 1 tablet (10 mg total) by mouth daily.   loratadine (EQ ALLERGY RELIEF) 10 MG tablet Take 1 tablet (10 mg total) by mouth daily.   sildenafil (VIAGRA) 50 MG tablet Take 1 tablet (50 mg total) by mouth daily as needed for erectile dysfunction.   tadalafil (CIALIS) 10 MG tablet Take 1 tablet (10 mg total) by mouth daily as needed for erectile dysfunction.   tadalafil (CIALIS) 20 MG tablet Take 1 tablet (20 mg total) by mouth as needed for erectile dysfunction.   No facility-administered encounter medications on file as of  07/17/2022.    Past Surgical History:  Procedure Laterality Date   KNEE ARTHROSCOPY Left 1993   LIPOMA EXCISION      Family History  Problem Relation Age of Onset   Anxiety disorder Mother    Anxiety disorder Father    Heart disease Father 12       with stent placement and bypass   Anxiety disorder Sister    Cancer Maternal Uncle        throat   Alzheimer's disease Maternal Grandmother    Cancer Paternal Grandmother    Heart disease Maternal Uncle    Heart disease Maternal Uncle       Controlled substance contract: 01/15/22     Review of Systems  Constitutional:  Negative for diaphoresis.  Eyes:  Negative for pain.  Respiratory:  Negative for shortness of breath.   Cardiovascular:  Negative for chest pain, palpitations and leg swelling.  Gastrointestinal:  Negative for abdominal pain.  Endocrine: Negative for polydipsia.  Skin:  Negative for rash.  Neurological:  Negative for dizziness, weakness and headaches.  Hematological:  Does not bruise/bleed easily.  All other systems reviewed and are negative.      Objective:   Physical Exam Vitals and nursing note reviewed.  Constitutional:      Appearance: Normal appearance. He is well-developed.  HENT:     Head: Normocephalic.     Right Ear: There is impacted cerumen.     Left Ear: There is no impacted cerumen.     Nose: Nose normal.     Mouth/Throat:     Mouth: Mucous membranes are moist.     Pharynx: Oropharynx is clear.  Eyes:     Pupils: Pupils are equal, round, and reactive to light.  Neck:     Thyroid: No thyroid mass or thyromegaly.     Vascular: No carotid bruit or JVD.     Trachea: Phonation normal.  Cardiovascular:     Rate and Rhythm: Normal rate and regular rhythm.  Pulmonary:     Effort: Pulmonary effort is normal. No respiratory distress.     Breath sounds: Normal breath sounds.  Abdominal:     General: Bowel sounds are normal.     Palpations: Abdomen is soft.     Tenderness: There is no  abdominal tenderness.  Musculoskeletal:        General: Normal range of motion.     Cervical back: Normal range of motion and neck supple.  Lymphadenopathy:     Cervical: No cervical adenopathy.  Skin:    General: Skin is warm and dry.  Neurological:     Mental Status: He is alert and oriented to person, place, and time.  Psychiatric:        Behavior: Behavior normal.        Thought Content: Thought content normal.        Judgment: Judgment normal.     BP (!) 140/82   Pulse 65   Temp 97.6 F (36.4 C) (Temporal)   Resp 20   Ht '5\' 9"'$  (1.753 m)   Wt 228 lb (103.4 kg)   SpO2 95%   BMI 33.67 kg/m    S/P right ear irrigation- TM normal     Assessment & Plan:   Tyler Barton comes in today with chief complaint of Medical Management of Chronic Issues   Diagnosis and orders addressed:  1. Primary hypertension Low sodium diet - lisinopril (ZESTRIL) 10 MG tablet; Take 1 tablet (10 mg total) by mouth daily.  Dispense: 90 tablet; Refill: 1 - CMP14+EGFR - Lipid panel - CBC with Differential/Platelet  2. Irritable bowel syndrome with both constipation and diarrhea Watch diet to prevent flare up - famotidine (PEPCID) 20 MG tablet; Take 1 tablet (20 mg total) by mouth 2 (two) times daily.  Dispense: 180 tablet; Refill: 1  3. Panic disorder Stress management Deep breathing exercises - buPROPion (WELLBUTRIN XL) 150 MG 24 hr tablet; Take 1 tablet (150 mg total) by mouth daily.  Dispense: 90 tablet; Refill: 1  4. Anxiety - diazepam (VALIUM) 5 MG tablet; Take 1 tablet (5 mg total) by mouth every 12 (twelve) hours as needed. for anxiety  Dispense: 60 tablet; Refill: 5  5. Recurrent major depressive disorder, in full remission ([place]) - buPROPion (WELLBUTRIN XL) 150 MG 24 hr tablet; Take 1 tablet (150 mg total) by mouth daily.  Dispense: 90 tablet; Refill: 1  6. Morbid obesity ([place]) Discussed diet and exercise for person with BMI >25 Will recheck weight in 3-6 months  7.  Cerumen impaction Debrox 2-3 x a week  Labs pending Health Maintenance reviewed Diet and exercise encouraged  Follow up plan: 6 months   Mary-Margaret Hassell Done, FNP

## 2022-07-17 NOTE — Patient Instructions (Signed)

## 2022-07-18 ENCOUNTER — Encounter (HOSPITAL_BASED_OUTPATIENT_CLINIC_OR_DEPARTMENT_OTHER): Payer: Self-pay

## 2022-07-18 ENCOUNTER — Other Ambulatory Visit: Payer: Self-pay

## 2022-07-18 DIAGNOSIS — R200 Anesthesia of skin: Secondary | ICD-10-CM | POA: Insufficient documentation

## 2022-07-18 DIAGNOSIS — Z5321 Procedure and treatment not carried out due to patient leaving prior to being seen by health care provider: Secondary | ICD-10-CM | POA: Insufficient documentation

## 2022-07-18 DIAGNOSIS — R5383 Other fatigue: Secondary | ICD-10-CM | POA: Insufficient documentation

## 2022-07-18 LAB — CBC WITH DIFFERENTIAL/PLATELET
Basophils Absolute: 0 10*3/uL (ref 0.0–0.2)
Basos: 1 %
EOS (ABSOLUTE): 0.4 10*3/uL (ref 0.0–0.4)
Eos: 6 %
Hematocrit: 42.1 % (ref 37.5–51.0)
Hemoglobin: 13.3 g/dL (ref 13.0–17.7)
Immature Grans (Abs): 0 10*3/uL (ref 0.0–0.1)
Immature Granulocytes: 0 %
Lymphocytes Absolute: 1.8 10*3/uL (ref 0.7–3.1)
Lymphs: 29 %
MCH: 28.6 pg (ref 26.6–33.0)
MCHC: 31.6 g/dL (ref 31.5–35.7)
MCV: 91 fL (ref 79–97)
Monocytes Absolute: 0.6 10*3/uL (ref 0.1–0.9)
Monocytes: 9 %
Neutrophils Absolute: 3.5 10*3/uL (ref 1.4–7.0)
Neutrophils: 55 %
Platelets: 343 10*3/uL (ref 150–450)
RBC: 4.65 x10E6/uL (ref 4.14–5.80)
RDW: 13.8 % (ref 11.6–15.4)
WBC: 6.3 10*3/uL (ref 3.4–10.8)

## 2022-07-18 LAB — CMP14+EGFR
ALT: 25 IU/L (ref 0–44)
AST: 24 IU/L (ref 0–40)
Albumin/Globulin Ratio: 1.9 (ref 1.2–2.2)
Albumin: 4.4 g/dL (ref 3.8–4.9)
Alkaline Phosphatase: 39 IU/L — ABNORMAL LOW (ref 44–121)
BUN/Creatinine Ratio: 12 (ref 9–20)
BUN: 13 mg/dL (ref 6–24)
Bilirubin Total: 0.5 mg/dL (ref 0.0–1.2)
CO2: 23 mmol/L (ref 20–29)
Calcium: 9.4 mg/dL (ref 8.7–10.2)
Chloride: 104 mmol/L (ref 96–106)
Creatinine, Ser: 1.12 mg/dL (ref 0.76–1.27)
Globulin, Total: 2.3 g/dL (ref 1.5–4.5)
Glucose: 100 mg/dL — ABNORMAL HIGH (ref 70–99)
Potassium: 4 mmol/L (ref 3.5–5.2)
Sodium: 142 mmol/L (ref 134–144)
Total Protein: 6.7 g/dL (ref 6.0–8.5)
eGFR: 79 mL/min/{1.73_m2} (ref >59)

## 2022-07-18 LAB — LIPID PANEL
Chol/HDL Ratio: 6.4 ratio — ABNORMAL HIGH (ref 0.0–5.0)
Cholesterol, Total: 219 mg/dL — ABNORMAL HIGH (ref 100–199)
HDL: 34 mg/dL — ABNORMAL LOW (ref >39)
LDL Chol Calc (NIH): 156 mg/dL — ABNORMAL HIGH (ref 0–99)
Triglycerides: 159 mg/dL — ABNORMAL HIGH (ref 0–149)
VLDL Cholesterol Cal: 29 mg/dL (ref 5–40)

## 2022-07-18 LAB — CBC
HCT: 44.3 % (ref 39.0–52.0)
Hemoglobin: 14.7 g/dL (ref 13.0–17.0)
MCH: 29.2 pg (ref 26.0–34.0)
MCHC: 33.2 g/dL (ref 30.0–36.0)
MCV: 87.9 fL (ref 80.0–100.0)
Platelets: 360 10*3/uL (ref 150–400)
RBC: 5.04 MIL/uL (ref 4.22–5.81)
RDW: 14 % (ref 11.5–15.5)
WBC: 7.4 10*3/uL (ref 4.0–10.5)
nRBC: 0 % (ref 0.0–0.2)

## 2022-07-18 LAB — BASIC METABOLIC PANEL
Anion gap: 10 (ref 5–15)
BUN: 11 mg/dL (ref 6–20)
CO2: 26 mmol/L (ref 22–32)
Calcium: 9.8 mg/dL (ref 8.9–10.3)
Chloride: 105 mmol/L (ref 98–111)
Creatinine, Ser: 1.18 mg/dL (ref 0.61–1.24)
GFR, Estimated: >60 mL/min (ref >60)
Glucose, Bld: 96 mg/dL (ref 70–99)
Potassium: 3.5 mmol/L (ref 3.5–5.1)
Sodium: 141 mmol/L (ref 135–145)

## 2022-07-18 LAB — TROPONIN I (HIGH SENSITIVITY): Troponin I (High Sensitivity): 2 ng/L (ref <18)

## 2022-07-18 NOTE — ED Triage Notes (Signed)
Patient here POV from Home.  Endorses not feeling well yesterday and feeling generally fatigued. Began to have "Cold-ness" in Chest and some Numbness in Left Arm yesterday that has been consistent today.   No SOB. Unsure if Symptoms are Anxiety-Driven.   NAD Noted during Triage. A&Ox4. GCS 15. Ambulatory.

## 2022-07-19 ENCOUNTER — Emergency Department (HOSPITAL_BASED_OUTPATIENT_CLINIC_OR_DEPARTMENT_OTHER): Payer: Medicare Other

## 2022-07-19 ENCOUNTER — Emergency Department (HOSPITAL_BASED_OUTPATIENT_CLINIC_OR_DEPARTMENT_OTHER)
Admission: EM | Admit: 2022-07-19 | Discharge: 2022-07-19 | Discharge status: Against Medical Advice | Payer: Medicare Other | Attending: Emergency Medicine | Admitting: Emergency Medicine

## 2022-07-25 ENCOUNTER — Other Ambulatory Visit: Payer: Self-pay | Admitting: Nurse Practitioner

## 2022-07-25 DIAGNOSIS — M25551 Pain in right hip: Secondary | ICD-10-CM

## 2022-07-27 ENCOUNTER — Ambulatory Visit (INDEPENDENT_AMBULATORY_CARE_PROVIDER_SITE_OTHER): Payer: Medicare Other | Admitting: Family Medicine

## 2022-07-27 ENCOUNTER — Encounter: Payer: Self-pay | Admitting: Family Medicine

## 2022-07-27 ENCOUNTER — Ambulatory Visit: Payer: Medicare Other | Admitting: Family Medicine

## 2022-07-27 VITALS — BP 117/83 | HR 96 | Temp 97.0°F | Resp 20 | Ht 69.0 in | Wt 219.0 lb

## 2022-07-27 DIAGNOSIS — M25551 Pain in right hip: Secondary | ICD-10-CM | POA: Diagnosis not present

## 2022-07-27 DIAGNOSIS — R21 Rash and other nonspecific skin eruption: Secondary | ICD-10-CM | POA: Diagnosis not present

## 2022-07-27 DIAGNOSIS — W19XXXA Unspecified fall, initial encounter: Secondary | ICD-10-CM | POA: Diagnosis not present

## 2022-07-27 MED ORDER — PREDNISONE 20 MG PO TABS: 40.0000 mg | ORAL_TABLET | Freq: Every day | ORAL | 0 refills | Status: AC

## 2022-07-27 MED ORDER — TRIAMCINOLONE ACETONIDE 0.5 % EX OINT: 1.0000 | TOPICAL_OINTMENT | Freq: Two times a day (BID) | CUTANEOUS | 0 refills | Status: DC

## 2022-07-27 NOTE — Progress Notes (Signed)
   Acute Office Visit  Subjective:     Patient ID: Tyler Barton, male    DOB: [birth year], 52 y.o.   MRN: [id number]  Chief Complaint  Patient presents with   Hip Pain    Hip Pain  The incident occurred more than 1 week ago. The injury mechanism was a fall. The pain is present in the right hip and right thigh. The pain has been Fluctuating since onset. Pertinent negatives include no inability to bear weight, loss of motion, loss of sensation, numbness or tingling. He reports no foreign bodies present. The symptoms are aggravated by movement and weight bearing. He has tried NSAIDs for the symptoms. The treatment provided mild relief.  He reports stiffness in his right hip first thing in the morning or after sitting for a period of time. Stiffness improves with activity.   He also has a rash along the bottom of his neck on both sides for the last month. He reports some mild itching. He has tried moisturizer without improvement. He denies changes in products or detergents. He denies exudate or tenderness.   ROS As per HPI.       Objective:    BP 117/83   Pulse 96   Temp (!) 97 F (36.1 C) (Oral)   Resp 20   Ht '5\' 9"'$  (1.753 m)   Wt 219 lb (99.3 kg)   SpO2 94%   BMI 32.34 kg/m    Physical Exam Vitals and nursing note reviewed.  Constitutional:      General: He is not in acute distress.    Appearance: He is not ill-appearing, toxic-appearing or diaphoretic.  Cardiovascular:     Rate and Rhythm: Normal rate and regular rhythm.     Heart sounds: Normal heart sounds. No murmur heard. Pulmonary:     Effort: Pulmonary effort is normal. No respiratory distress.     Breath sounds: Normal breath sounds.  Musculoskeletal:     Right hip: Tenderness present. No deformity, lacerations or bony tenderness. Normal range of motion. Normal strength.  Skin:    Findings: Rash (dermatitis along lower bilateral neck) present.  Neurological:     General: No focal deficit present.     Mental  Status: He is alert and oriented to person, place, and time.  Psychiatric:        Mood and Affect: Mood normal.     No results found for any visits on 07/27/22.      Assessment & Plan:   Kacper was seen today for hip pain.  Diagnoses and all orders for this visit:  Acute right hip pain Fall, initial encounter Will obtain xray. No xray tech today- he will return tomorrow. Prednisone burst as below. Continue celebrex. Discussed symptomatic care and return precautions. Will notify patient of results and plan of care pending results.  -     Cancel: DG Hip Unilat W OR W/O Pelvis 2-3 Views Right; Future -     predniSONE (DELTASONE) 20 MG tablet; Take 2 tablets (40 mg total) by mouth daily with breakfast for 5 days. -     DG Hip Unilat W OR W/O Pelvis 2-3 Views Right; Future  Rash Dermatitis. Kenalog as below.  -     triamcinolone ointment (KENALOG) 0.5 %; Apply 1 Application topically 2 (two) times daily.  Return if symptoms worsen or fail to improve.  Gwenlyn Perking, FNP

## 2022-07-27 NOTE — Patient Instructions (Signed)
Hip Pain The hip is the joint between the upper legs and the lower pelvis. The bones, cartilage, tendons, and muscles of your hip joint support your body and allow you to move around. Hip pain can range from a minor ache to severe pain in one or both of your hips. The pain may be felt on the inside of the hip joint near the groin, or on the outside near the buttocks and upper thigh. You may also have swelling or stiffness in your hip area. Follow these instructions at home: Managing pain, stiffness, and swelling     If directed, put ice on the painful area. To do this: Put ice in a plastic bag. Place a towel between your skin and the bag. Leave the ice on for 20 minutes, 2-3 times a day. If directed, apply heat to the affected area as often as told by your health care provider. Use the heat source that your health care provider recommends, such as a moist heat pack or a heating pad. Place a towel between your skin and the heat source. Leave the heat on for 20-30 minutes. Remove the heat if your skin turns bright red. This is especially important if you are unable to feel pain, heat, or cold. You may have a greater risk of getting burned. Activity Do exercises as told by your health care provider. Avoid activities that cause pain. General instructions  Take over-the-counter and prescription medicines only as told by your health care provider. Keep a journal of your symptoms. Write down: How often you have hip pain. The location of your pain. What the pain feels like. What makes the pain worse. Sleep with a pillow between your legs on your most comfortable side. Keep all follow-up visits as told by your health care provider. This is important. Contact a health care provider if: You cannot put weight on your leg. Your pain or swelling continues or gets worse after one week. It gets harder to walk. You have a fever. Get help right away if: You fall. You have a sudden increase in pain  and swelling in your hip. Your hip is red or swollen or very tender to touch. Summary Hip pain can range from a minor ache to severe pain in one or both of your hips. The pain may be felt on the inside of the hip joint near the groin, or on the outside near the buttocks and upper thigh. Avoid activities that cause pain. Write down how often you have hip pain, the location of the pain, what makes it worse, and what it feels like. This information is not intended to replace advice given to you by your health care provider. Make sure you discuss any questions you have with your health care provider. Document Revised: 11/07/2018 Document Reviewed: 11/07/2018 Elsevier Patient Education  2023 Elsevier Inc.  

## 2022-07-29 ENCOUNTER — Other Ambulatory Visit (INDEPENDENT_AMBULATORY_CARE_PROVIDER_SITE_OTHER): Payer: Medicare Other

## 2022-07-29 DIAGNOSIS — W19XXXA Unspecified fall, initial encounter: Secondary | ICD-10-CM

## 2022-07-29 DIAGNOSIS — M25551 Pain in right hip: Secondary | ICD-10-CM

## 2022-08-26 ENCOUNTER — Ambulatory Visit (INDEPENDENT_AMBULATORY_CARE_PROVIDER_SITE_OTHER): Payer: Medicare Other | Admitting: Family Medicine

## 2022-08-26 ENCOUNTER — Encounter: Payer: Self-pay | Admitting: Family Medicine

## 2022-08-26 VITALS — BP 111/71 | HR 68 | Temp 98.3°F | Ht 69.0 in | Wt 224.4 lb

## 2022-08-26 DIAGNOSIS — R21 Rash and other nonspecific skin eruption: Secondary | ICD-10-CM | POA: Diagnosis not present

## 2022-08-26 MED ORDER — NYSTATIN 100000 UNIT/GM EX CREA: 1.0000 | TOPICAL_CREAM | Freq: Two times a day (BID) | CUTANEOUS | 0 refills | Status: DC

## 2022-08-26 NOTE — Patient Instructions (Signed)
Rash, Adult A rash is a change in the color of your skin. A rash can also change the way your skin feels. There are many different conditions and factors that can cause a rash. Some rashes may disappear after a few days, but some may last for a few weeks. Common causes of rashes include: Viral infections, such as: Colds. Measles. Hand, foot, and mouth disease. Bacterial infections, such as: Scarlet fever. Impetigo. Fungal infections, such as Candida. Allergic reactions to food, medicines, or skin care products. Follow these instructions at home: The goal of treatment is to stop the itching and keep the rash from spreading. Pay attention to any changes in your symptoms. Follow these instructions to help with your condition: Medicine Take or apply over-the-counter and prescription medicines only as told by your health care provider. These may include: Corticosteroid creams to treat red or swollen skin. Anti-itch lotions. Oral allergy medicines (antihistamines). Oral corticosteroids for severe symptoms.  Skin care Apply cool compresses to the affected areas. Do not scratch or rub your skin. Avoid covering the rash. Make sure the rash is exposed to air as much as possible. Managing itching and discomfort Avoid hot showers or baths, which can make itching worse. A cold shower may help. Try taking a bath with: Epsom salts. Follow manufacturer instructions on the packaging. You can get these at your local pharmacy or grocery store. Baking soda. Pour a small amount into the bath as told by your health care provider. Colloidal oatmeal. Follow manufacturer instructions on the packaging. You can get this at your local pharmacy or grocery store. Try applying baking soda paste to your skin. Stir water into baking soda until it reaches a paste-like consistency. Try applying calamine lotion. This is an over-the-counter lotion that helps to relieve itchiness. Keep cool and out of the sun. Sweating  and being hot can make itching worse. General instructions  Rest as needed. Drink enough fluid to keep your urine pale yellow. Wear loose-fitting clothing. Avoid scented soaps, detergents, and perfumes. Use gentle soaps, detergents, perfumes, and other cosmetic products. Avoid any substance that causes your rash. Keep a journal to help track what causes your rash. Write down: What you eat. What cosmetic products you use. What you drink. What you wear. This includes jewelry. Keep all follow-up visits as told by your health care provider. This is important. Contact a health care provider if: You sweat at night. You lose weight. You urinate more than normal. You urinate less than normal, or you notice that your urine is a darker color than usual. You feel weak. You vomit. Your skin or the whites of your eyes look yellow (jaundice). Your skin: Tingles. Is numb. Your rash: Does not go away after several days. Gets worse. You are: Unusually thirsty. More tired than normal. You have: New symptoms. Pain in your abdomen. A fever. Diarrhea. Get help right away if you: Have a fever and your symptoms suddenly get worse. Develop confusion. Have a severe headache or a stiff neck. Have severe joint pains or stiffness. Have a seizure. Develop a rash that covers all or most of your body. The rash may or may not be painful. Develop blisters that: Are on top of the rash. Grow larger or grow together. Are painful. Are inside your nose or mouth. Develop a rash that: Looks like purple pinprick-sized spots all over your body. Has a "bull's eye" or looks like a target. Is not related to sun exposure, is red and painful, and causes   your skin to peel. Summary A rash is a change in the color of your skin. Some rashes disappear after a few days, but some may last for a few weeks. The goal of treatment is to stop the itching and keep the rash from spreading. Take or apply over-the-counter  and prescription medicines only as told by your health care provider. Contact a health care provider if you have new or worsening symptoms. Keep all follow-up visits as told by your health care provider. This is important. This information is not intended to replace advice given to you by your health care provider. Make sure you discuss any questions you have with your health care provider. Document Revised: 12/23/2021 Document Reviewed: 04/03/2021 Elsevier Patient Education  [employer].

## 2022-08-26 NOTE — Progress Notes (Signed)
   Acute Office Visit  Subjective:     Patient ID: Tyler Barton, male    DOB: [birth year], 52 y.o.   MRN: [id number]  Chief Complaint  Patient presents with   Rash    Rash This is a recurrent problem. The current episode started more than 1 month ago. The affected locations include the neck. The rash is characterized by itchiness and redness. He was exposed to nothing. Pertinent negatives include no anorexia, congestion, cough, eye pain, facial edema, fatigue, fever, joint pain, nail changes, shortness of breath, sore throat or vomiting. Past treatments include topical steroids and moisturizer. The treatment provided no relief.  He would like a referral to dermatology.    Review of Systems  Constitutional:  Negative for fatigue and fever.  HENT:  Negative for congestion and sore throat.   Eyes:  Negative for pain.  Respiratory:  Negative for cough and shortness of breath.   Gastrointestinal:  Negative for anorexia and vomiting.  Musculoskeletal:  Negative for joint pain.  Skin:  Positive for rash. Negative for nail changes.        Objective:    BP 111/71   Pulse 68   Temp 98.3 F (36.8 C) (Temporal)   Ht 5' 9"$  (1.753 m)   Wt 224 lb 6 oz (101.8 kg)   SpO2 93%   BMI 33.13 kg/m    Physical Exam Vitals and nursing note reviewed.  Pulmonary:     Effort: Pulmonary effort is normal. No respiratory distress.  Skin:    Comments: Erythema with dry flaky skin in neck folds.   Neurological:     General: No focal deficit present.     Mental Status: He is alert and oriented to person, place, and time.  Psychiatric:        Mood and Affect: Mood normal.        Behavior: Behavior normal.     No results found for any visits on 08/26/22.      Assessment & Plan:   Ajan was seen today for rash.  Diagnoses and all orders for this visit:  Rash Try nystatin as below since he didn't have improvement with topical steroids. Discussed can use OTC dandruff shampoo on neck if  nystatin help. Referral to derm as requested. Return to office for new or worsening symptoms, or if symptoms persist.  -     nystatin cream (MYCOSTATIN); Apply 1 Application topically 2 (two) times daily. -     Ambulatory referral to Dermatology  The patient indicates understanding of these issues and agrees with the plan.  Gwenlyn Perking, FNP

## 2022-10-28 ENCOUNTER — Other Ambulatory Visit: Payer: Self-pay | Admitting: Nurse Practitioner

## 2022-10-28 ENCOUNTER — Encounter: Payer: Self-pay | Admitting: Family Medicine

## 2022-10-28 ENCOUNTER — Telehealth (INDEPENDENT_AMBULATORY_CARE_PROVIDER_SITE_OTHER): Payer: Medicare Other | Admitting: Family Medicine

## 2022-10-28 DIAGNOSIS — M25551 Pain in right hip: Secondary | ICD-10-CM

## 2022-10-28 DIAGNOSIS — R42 Dizziness and giddiness: Secondary | ICD-10-CM | POA: Diagnosis not present

## 2022-10-28 DIAGNOSIS — J0100 Acute maxillary sinusitis, unspecified: Secondary | ICD-10-CM

## 2022-10-28 MED ORDER — PREDNISONE 10 MG PO TABS: ORAL_TABLET | ORAL | 0 refills | Status: DC

## 2022-10-28 MED ORDER — AMOXICILLIN-POT CLAVULANATE 875-125 MG PO TABS: 1.0000 | ORAL_TABLET | Freq: Two times a day (BID) | ORAL | 0 refills | Status: DC

## 2022-10-28 MED ORDER — MECLIZINE HCL 50 MG PO TABS: 50.0000 mg | ORAL_TABLET | Freq: Three times a day (TID) | ORAL | 1 refills | Status: AC | PRN

## 2022-10-28 NOTE — Progress Notes (Signed)
Subjective:  Patient ID: Tyler Barton, male    DOB: August 03, 1970  Age: 52 y.o. MRN: [id number]  CC: No chief complaint on file.   HPI TUNIS GENTLE presents for dizziness. Onset 2 days ago. Leaned over. When he raised up he got dizzy. Not lightheaded. Head was spinning. Off balance. Had to hold onto the side of his truck. Lasted 30-40 seconds. Last night it recurred lasting most of the evening. Laid down and felt like the bed was spinning and threw him off of the bed..  Symptoms include congestion, left facial pain, nasal congestion, non productive cough, post nasal drip and sinus pressure. There is no fever, chills, or sweats. Onset of symptoms was a few days ago, gradually worsening since that time.       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   He has a past surgical history that includes Knee arthroscopy (Left, 1993) and Lipoma excision.   His family history includes Alzheimer's disease in his maternal grandmother; Anxiety disorder in his father, mother, and sister; Cancer in his maternal uncle and paternal grandmother; Heart disease in his maternal uncle and maternal uncle; Heart disease (age of onset: 56) in his father.He reports that he has never smoked. He has never used smokeless tobacco. He reports current alcohol use. He reports that he does not use drugs.    ROS Review of Systems   Constitutional:  Negative for activity change, appetite change, chills and fever.  HENT:  Positive for congestion, postnasal drip, rhinorrhea and sinus pressure. Negative for ear discharge, ear pain, hearing loss, nosebleeds, sneezing and trouble swallowing.   Respiratory:  Negative for chest tightness and shortness of breath.   Cardiovascular:  Negative for chest pain and palpitations.  Skin:  Negative for rash.  Neurological:  Positive for dizziness. Negative for syncope, weakness, light-headedness and numbness.    Objective:  There were no vitals taken for this visit.  BP Readings from Last 3 Encounters:  08/26/22 111/71  07/27/22 117/83  07/18/22 (!) 134/95    Wt Readings from Last 3 Encounters:  08/26/22 224 lb 6 oz (101.8 kg)  07/27/22 219 lb (99.3 kg)  07/18/22 227 lb 15.3 oz (103.4 kg)     Physical Exam  NAD, video  Assessment & Plan:   Diagnoses and all orders for this visit:  Vertigo  Acute maxillary sinusitis, recurrence not specified  Other orders -     amoxicillin-clavulanate (AUGMENTIN) 875-125 MG tablet; Take 1 tablet by mouth 2 (two) times daily. Take all of this medication -     predniSONE (DELTASONE) 10 MG tablet; Take 5 daily for 2 days followed by 4,3,2 and 1 for 2 days each. -     meclizine (ANTIVERT) 50 MG tablet; Take  1 tablet (50 mg total) by mouth 3 (three) times daily as needed.       I am having Vania Rea. Brahmbhatt start on amoxicillin-clavulanate, predniSONE, and meclizine. I am also having him maintain his fluticasone, loratadine, tadalafil, buPROPion, famotidine, diazepam, lisinopril, celecoxib, triamcinolone ointment, and nystatin cream.  Allergies as of 10/28/2022   No Known Allergies      Medication List        Accurate as of October 28, 2022  5:12 PM. If you have any questions, ask your nurse or doctor.          amoxicillin-clavulanate 875-125 MG tablet Commonly known as: AUGMENTIN Take 1 tablet by mouth 2 (two) times  daily. Take all of this medication Started by: Mechele Claude, MD   buPROPion 150 MG 24 hr tablet Commonly known as: WELLBUTRIN XL Take 1 tablet (150 mg total) by mouth daily.   celecoxib 200 MG capsule Commonly known as: CELEBREX Take 1 capsule by mouth twice daily   diazepam 5 MG tablet Commonly known as: VALIUM Take 1 tablet (5 mg total) by mouth every 12 (twelve) hours as needed. for anxiety   famotidine 20 MG tablet Commonly known as: PEPCID Take 1 tablet (20 mg total) by mouth 2 (two) times daily.   fluticasone 50 MCG/ACT nasal spray Commonly known as: FLONASE Place 2 sprays into both nostrils daily.   lisinopril 10 MG tablet Commonly known as: ZESTRIL Take 1 tablet (10 mg total) by mouth daily.   loratadine 10 MG tablet Commonly known as: EQ Allergy Relief Take 1 tablet (10 mg total) by mouth daily.   meclizine 50 MG tablet Commonly known as: ANTIVERT Take 1 tablet (50 mg total) by mouth 3 (three) times daily as needed. Started by: Mechele Claude, MD   nystatin cream Commonly known as: MYCOSTATIN Apply 1 Application topically 2 (two) times daily.   predniSONE 10 MG tablet Commonly known as: DELTASONE Take 5 daily for 2 days followed by 4,3,2 and 1 for 2 days each. Started by: Mechele Claude, MD   tadalafil 20 MG tablet Commonly known as: CIALIS Take 1 tablet (20 mg total) by mouth as needed for erectile dysfunction.   triamcinolone ointment 0.5 % Commonly known as: KENALOG Apply 1 Application topically 2 (two) times daily.       Video visit  I discussed the limitations, risks, security and privacy concerns of performing an evaluation and management service by video and the availability of in person appointments. I also discussed with the patient that there may be a patient responsible charge related to this service. The patient expressed understanding and agreed to proceed. Pt. Is at home. Dr. Darlyn Read is in his office.  Follow Up Instructions:   I  discussed the assessment and treatment plan with the patient. The patient was provided an opportunity to ask questions and all were answered. The patient agreed with the plan and demonstrated an understanding of the instructions.   The patient was advised to call back or seek an in-person evaluation if the symptoms worsen or if the condition fails to improve as anticipated.  Total minutes including chart review and phone contact time: 16   Follow-up: Return if symptoms worsen or fail to improve.  Mechele Claude, M.D.

## 2022-10-30 ENCOUNTER — Telehealth: Payer: Self-pay

## 2022-10-30 NOTE — Telephone Encounter (Signed)
Rudi Coco (Key: [facility]) PA Case ID #: [id number] Need Help? Call us at [phone] Status sent iconSent to Plan today Drug predniSONE 10MG  tablets ePA cloud logo Form [facility] Electronic Prior Authorization Form [phone] NCP[place])

## 2022-11-10 NOTE — Telephone Encounter (Signed)
PA denied for "not meeting the prior authorization requirements" according to [facility], no denial letter received.

## 2022-11-25 ENCOUNTER — Other Ambulatory Visit: Payer: Self-pay | Admitting: Nurse Practitioner

## 2022-11-25 DIAGNOSIS — M25552 Pain in left hip: Secondary | ICD-10-CM

## 2022-12-22 ENCOUNTER — Other Ambulatory Visit: Payer: Self-pay | Admitting: Nurse Practitioner

## 2022-12-22 DIAGNOSIS — M25551 Pain in right hip: Secondary | ICD-10-CM

## 2023-01-15 ENCOUNTER — Encounter: Payer: Self-pay | Admitting: Nurse Practitioner

## 2023-01-15 ENCOUNTER — Ambulatory Visit (INDEPENDENT_AMBULATORY_CARE_PROVIDER_SITE_OTHER): Payer: Medicare Other | Admitting: Nurse Practitioner

## 2023-01-15 VITALS — BP 143/91 | HR 69 | Temp 98.3°F | Resp 20 | Ht 69.0 in | Wt 227.0 lb

## 2023-01-15 DIAGNOSIS — I10 Essential (primary) hypertension: Secondary | ICD-10-CM

## 2023-01-15 DIAGNOSIS — F3342 Major depressive disorder, recurrent, in full remission: Secondary | ICD-10-CM | POA: Diagnosis not present

## 2023-01-15 DIAGNOSIS — Z6833 Body mass index (BMI) 33.0-33.9, adult: Secondary | ICD-10-CM

## 2023-01-15 DIAGNOSIS — K582 Mixed irritable bowel syndrome: Secondary | ICD-10-CM

## 2023-01-15 DIAGNOSIS — Z125 Encounter for screening for malignant neoplasm of prostate: Secondary | ICD-10-CM

## 2023-01-15 DIAGNOSIS — F410 Panic disorder [episodic paroxysmal anxiety] without agoraphobia: Secondary | ICD-10-CM | POA: Diagnosis not present

## 2023-01-15 DIAGNOSIS — Z79891 Long term (current) use of opiate analgesic: Secondary | ICD-10-CM | POA: Diagnosis not present

## 2023-01-15 DIAGNOSIS — F419 Anxiety disorder, unspecified: Secondary | ICD-10-CM | POA: Diagnosis not present

## 2023-01-15 DIAGNOSIS — N5201 Erectile dysfunction due to arterial insufficiency: Secondary | ICD-10-CM

## 2023-01-15 LAB — CBC WITH DIFFERENTIAL/PLATELET
Basophils Absolute: 0.1 10*3/uL (ref 0.0–0.2)
EOS (ABSOLUTE): 0.3 10*3/uL (ref 0.0–0.4)
Eos: 4 %
Hematocrit: 42.9 % (ref 37.5–51.0)
Immature Grans (Abs): 0 10*3/uL (ref 0.0–0.1)
Immature Granulocytes: 0 %
Lymphocytes Absolute: 1.7 10*3/uL (ref 0.7–3.1)
Lymphs: 26 %
MCH: 29.7 pg (ref 26.6–33.0)
MCV: 90 fL (ref 79–97)
Monocytes Absolute: 0.8 10*3/uL (ref 0.1–0.9)
Monocytes: 12 %
Neutrophils: 57 %
Platelets: 331 10*3/uL (ref 150–450)
RBC: 4.75 x10E6/uL (ref 4.14–5.80)
RDW: 13.1 % (ref 11.6–15.4)
WBC: 6.4 10*3/uL (ref 3.4–10.8)

## 2023-01-15 LAB — CMP14+EGFR

## 2023-01-15 LAB — LIPID PANEL

## 2023-01-15 MED ORDER — FAMOTIDINE 20 MG PO TABS: 20.0000 mg | ORAL_TABLET | Freq: Two times a day (BID) | ORAL | 1 refills | Status: DC

## 2023-01-15 MED ORDER — LISINOPRIL 10 MG PO TABS: 10.0000 mg | ORAL_TABLET | Freq: Every day | ORAL | 1 refills | Status: DC

## 2023-01-15 MED ORDER — BUPROPION HCL ER (XL) 150 MG PO TB24: 150.0000 mg | ORAL_TABLET | Freq: Every day | ORAL | 1 refills | Status: DC

## 2023-01-15 MED ORDER — DIAZEPAM 5 MG PO TABS: 5.0000 mg | ORAL_TABLET | Freq: Two times a day (BID) | ORAL | 5 refills | Status: DC | PRN

## 2023-01-15 MED ORDER — TADALAFIL 20 MG PO TABS: 20.0000 mg | ORAL_TABLET | ORAL | 3 refills | Status: DC | PRN

## 2023-01-15 NOTE — Patient Instructions (Signed)

## 2023-01-15 NOTE — Progress Notes (Signed)
Subjective:    Patient ID: Tyler Barton, male    DOB: April 25, 1971, 52 y.o.   MRN: [id number]   Chief Complaint: medical management of chronic issues     HPI:  Tyler Barton is a 52 y.o. who identifies as a male who was assigned male at birth.   Social history: Lives with: wife Work history: disability for anxiety   Comes in today for follow up of the following chronic medical issues:  1. Primary hypertension No c/o chest pain, sob or headache. He stopped taking lisinopril cause he says he was just feeling bad. Since then his blood pressure has been running high. BP Readings from Last 3 Encounters:  01/15/23 (!) 143/91  08/26/22 111/71  07/27/22 117/83      2. Recurrent major depressive disorder, in full remission (HCC) 3. Anxiety 4. Panic disorder Patient  is a very anxious person, he worries about everything. He cannot let things from the past go. When he gets something on his mind he can't let it go.    01/15/2023    8:07 AM 08/26/2022   11:27 AM 07/27/2022    8:03 AM  Depression screen PHQ 2/9  Decreased Interest 0 0 0  Down, Depressed, Hopeless 0 0 0  PHQ - 2 Score 0 0 0  Altered sleeping 0 0 0  Tired, decreased energy 0 1 0  Change in appetite 1 1 0  Feeling bad or failure about yourself  0 0 0  Trouble concentrating 1 1 0  Moving slowly or fidgety/restless 0 0 0  Suicidal thoughts 0 0 0  PHQ-9 Score 2 3 0  Difficult doing work/chores Somewhat difficult Not difficult at all       01/15/2023    8:07 AM 08/26/2022   11:28 AM 07/17/2022    8:11 AM 02/06/2022    8:27 AM  GAD 7 : Generalized Anxiety Score  Nervous, Anxious, on Edge 0 1 1 1   Control/stop worrying 1 1 1 1   Worry too much - different things 1 1 1 1   Trouble relaxing 1 0 1 0  Restless 0 0 0 0  Easily annoyed or irritable 0 0 0 0  Afraid - awful might happen 0 0 0 0  Total GAD 7 Score 3 3 4 3   Anxiety Difficulty Somewhat difficult Somewhat difficult Not difficult at all Somewhat difficult       5. Irritable bowel syndrome with both constipation and diarrhea Has alternating constipation and diarrhea. Due to his anxiety.  6. Morbid obesity Weight is up 3 lbs Wt Readings from Last 3 Encounters:  01/15/23 227 lb (103 kg)  08/26/22 224 lb 6 oz (101.8 kg)  07/27/22 219 lb (99.3 kg)   BMI Readings from Last 3 Encounters:  01/15/23 33.52 kg/m  08/26/22 33.13 kg/m  07/27/22 32.34 kg/m      New complaints: None today  No Known Allergies Outpatient Encounter Medications as of 01/15/2023  Medication Sig   amoxicillin-clavulanate (AUGMENTIN) 875-125 MG tablet Take 1 tablet by mouth 2 (two) times daily. Take all of this medication   buPROPion (WELLBUTRIN XL) 150 MG 24 hr tablet Take 1 tablet (150 mg total) by mouth daily.   celecoxib (CELEBREX) 200 MG capsule Take 1 capsule by mouth twice daily   diazepam (VALIUM) 5 MG tablet Take 1 tablet (5 mg total) by mouth every 12 (twelve) hours as needed. for anxiety   famotidine (PEPCID) 20 MG tablet Take 1 tablet (20 mg total)  by mouth 2 (two) times daily.   fluticasone (FLONASE) 50 MCG/ACT nasal spray Place 2 sprays into both nostrils daily.   lisinopril (ZESTRIL) 10 MG tablet Take 1 tablet (10 mg total) by mouth daily.   loratadine (EQ ALLERGY RELIEF) 10 MG tablet Take 1 tablet (10 mg total) by mouth daily.   meclizine (ANTIVERT) 50 MG tablet Take 1 tablet (50 mg total) by mouth 3 (three) times daily as needed.   nystatin cream (MYCOSTATIN) Apply 1 Application topically 2 (two) times daily.   predniSONE (DELTASONE) 10 MG tablet Take 5 daily for 2 days followed by 4,3,2 and 1 for 2 days each.   tadalafil (CIALIS) 20 MG tablet Take 1 tablet (20 mg total) by mouth as needed for erectile dysfunction.   triamcinolone ointment (KENALOG) 0.5 % Apply 1 Application topically 2 (two) times daily.   No facility-administered encounter medications on file as of 01/15/2023.    Past Surgical History:  Procedure Laterality Date   KNEE  ARTHROSCOPY Left 1993   LIPOMA EXCISION      Family History  Problem Relation Age of Onset   Anxiety disorder Mother    Anxiety disorder Father    Heart disease Father 23       with stent placement and bypass   Anxiety disorder Sister    Cancer Maternal Uncle        throat   Alzheimer's disease Maternal Grandmother    Cancer Paternal Grandmother    Heart disease Maternal Uncle    Heart disease Maternal Uncle       Controlled substance contract: n/a     Review of Systems  Constitutional:  Negative for diaphoresis.  Eyes:  Negative for pain.  Respiratory:  Negative for shortness of breath.   Cardiovascular:  Negative for chest pain, palpitations and leg swelling.  Gastrointestinal:  Negative for abdominal pain.  Endocrine: Negative for polydipsia.  Skin:  Negative for rash.  Neurological:  Negative for dizziness, weakness and headaches.  Hematological:  Does not bruise/bleed easily.  All other systems reviewed and are negative.      Objective:   Physical Exam Vitals and nursing note reviewed.  Constitutional:      Appearance: Normal appearance. He is well-developed.  HENT:     Head: Normocephalic.     Nose: Nose normal.     Mouth/Throat:     Mouth: Mucous membranes are moist.     Pharynx: Oropharynx is clear.  Eyes:     Pupils: Pupils are equal, round, and reactive to light.  Neck:     Thyroid: No thyroid mass or thyromegaly.     Vascular: No carotid bruit or JVD.     Trachea: Phonation normal.  Cardiovascular:     Rate and Rhythm: Normal rate and regular rhythm.  Pulmonary:     Effort: Pulmonary effort is normal. No respiratory distress.     Breath sounds: Normal breath sounds.  Abdominal:     General: Bowel sounds are normal.     Palpations: Abdomen is soft.     Tenderness: There is no abdominal tenderness.  Musculoskeletal:        General: Normal range of motion.     Cervical back: Normal range of motion and neck supple.  Lymphadenopathy:      Cervical: No cervical adenopathy.  Skin:    General: Skin is warm and dry.  Neurological:     Mental Status: He is alert and oriented to person, place, and time.  Psychiatric:  Behavior: Behavior normal.        Thought Content: Thought content normal.        Judgment: Judgment normal.     BP (!) 143/91   Pulse 69   Temp 98.3 F (36.8 C) (Temporal)   Resp 20   Ht 5\' 9"  (1.753 m)   Wt 227 lb (103 kg)   SpO2 95%   BMI 33.52 kg/m        Assessment & Plan:   Tyler Barton comes in today with chief complaint of Medical Management of Chronic Issues   Diagnosis and orders addressed:  1. Primary hypertension Low sodium diet Back on lisinopril - lisinopril (ZESTRIL) 10 MG tablet; Take 1 tablet (10 mg total) by mouth daily.  Dispense: 90 tablet; Refill: 1 - CBC with Differential/Platelet - CMP14+EGFR - Lipid panel  2. Recurrent major depressive disorder, in full remission (HCC) Stress management - buPROPion (WELLBUTRIN XL) 150 MG 24 hr tablet; Take 1 tablet (150 mg total) by mouth daily.  Dispense: 90 tablet; Refill: 1  3. Anxiety - diazepam (VALIUM) 5 MG tablet; Take 1 tablet (5 mg total) by mouth every 12 (twelve) hours as needed. for anxiety  Dispense: 60 tablet; Refill: 5  4. Panic disorder Deep breathing exercises - ToxASSURE Select 13 (MW), Urine - buPROPion (WELLBUTRIN XL) 150 MG 24 hr tablet; Take 1 tablet (150 mg total) by mouth daily.  Dispense: 90 tablet; Refill: 1  5. Irritable bowel syndrome with both constipation and diarrhea Watch diet to help with flare ups - famotidine (PEPCID) 20 MG tablet; Take 1 tablet (20 mg total) by mouth 2 (two) times daily.  Dispense: 180 tablet; Refill: 1  6. Morbid obesity (HCC) Discussed diet and exercise for person with BMI >25 Will recheck weight in 3-6 months   7. Erectile dysfunction due to arterial insufficiency - tadalafil (CIALIS) 20 MG tablet; Take 1 tablet (20 mg total) by mouth as needed for erectile  dysfunction.  Dispense: 45 tablet; Refill: 3  8. Prostate cancer screening Labs pending - PSA, total and free   Labs pending Health Maintenance reviewed Diet and exercise encouraged  Follow up plan: 6 months   Mary-Margaret Daphine Deutscher, FNP

## 2023-01-16 LAB — CMP14+EGFR
AST: 39 IU/L (ref 0–40)
Albumin: 4.3 g/dL (ref 3.8–4.9)
BUN: 12 mg/dL (ref 6–24)
CO2: 23 mmol/L (ref 20–29)
Calcium: 9.3 mg/dL (ref 8.7–10.2)
Chloride: 102 mmol/L (ref 96–106)
Creatinine, Ser: 1.12 mg/dL (ref 0.76–1.27)
Globulin, Total: 2.1 g/dL (ref 1.5–4.5)
Sodium: 142 mmol/L (ref 134–144)
Total Protein: 6.4 g/dL (ref 6.0–8.5)
eGFR: 79 mL/min/{1.73_m2} (ref >59)

## 2023-01-16 LAB — CBC WITH DIFFERENTIAL/PLATELET
Basos: 1 %
Hemoglobin: 14.1 g/dL (ref 13.0–17.7)
MCHC: 32.9 g/dL (ref 31.5–35.7)
Neutrophils Absolute: 3.6 10*3/uL (ref 1.4–7.0)

## 2023-01-16 LAB — PSA, TOTAL AND FREE
PSA, Free Pct: 58.3 %
PSA, Free: 0.35 ng/mL
Prostate Specific Ag, Serum: 0.6 ng/mL (ref 0.0–4.0)

## 2023-01-16 LAB — LIPID PANEL: Triglycerides: 186 mg/dL — ABNORMAL HIGH (ref 0–149)

## 2023-01-20 LAB — TOXASSURE SELECT 13 (MW), URINE

## 2023-01-21 ENCOUNTER — Encounter: Payer: Self-pay | Admitting: Nurse Practitioner

## 2023-01-21 ENCOUNTER — Ambulatory Visit (INDEPENDENT_AMBULATORY_CARE_PROVIDER_SITE_OTHER): Payer: Medicare Other | Admitting: Nurse Practitioner

## 2023-01-21 VITALS — BP 115/84 | HR 74 | Temp 97.9°F | Resp 20 | Ht 69.0 in | Wt 227.0 lb

## 2023-01-21 DIAGNOSIS — R109 Unspecified abdominal pain: Secondary | ICD-10-CM

## 2023-01-21 DIAGNOSIS — R10A0 Flank pain, unspecified side: Secondary | ICD-10-CM

## 2023-01-21 NOTE — Progress Notes (Signed)
   Subjective:    Patient ID: Tyler Barton, male    DOB: Aug 24, 1970, 52 y.o.   MRN: [id number]   Chief Complaint: hernia  HPI  Patient has ben having left flank pain after pressure washing his sisters deck. Denies rash. Rates pain 3/10 when he is laying down at night.  Patient Active Problem List   Diagnosis Date Noted   Primary hypertension 10/21/2021   Anxiety    Morbid obesity (HCC)    Chronic pain of left knee 11/01/2017   Recurrent major depressive disorder, in full remission (HCC) 05/07/2017   IBS (irritable bowel syndrome) 11/21/2014   Panic disorder 03/14/2013       Review of Systems  Constitutional:  Negative for diaphoresis.  Eyes:  Negative for pain.  Respiratory:  Negative for shortness of breath.   Cardiovascular:  Negative for chest pain, palpitations and leg swelling.  Gastrointestinal:  Negative for abdominal pain.  Endocrine: Negative for polydipsia.  Skin:  Negative for rash.  Neurological:  Negative for dizziness, weakness and headaches.  Hematological:  Does not bruise/bleed easily.  All other systems reviewed and are negative.      Objective:   Physical Exam Constitutional:      Appearance: Normal appearance.  Cardiovascular:     Rate and Rhythm: Normal rate and regular rhythm.     Heart sounds: Normal heart sounds.  Pulmonary:     Effort: Pulmonary effort is normal.     Breath sounds: Normal breath sounds.  Abdominal:     General: Abdomen is flat. Bowel sounds are normal.     Palpations: Abdomen is soft.  Skin:    General: Skin is warm.  Neurological:     General: No focal deficit present.     Mental Status: He is alert and oriented to person, place, and time.  Psychiatric:        Mood and Affect: Mood normal.        Behavior: Behavior normal.    BP 115/84   Pulse 74   Temp 97.9 F (36.6 C) (Temporal)   Resp 20   Ht 5\' 9"  (1.753 m)   Wt 227 lb (103 kg)   SpO2 97%   BMI 33.52 kg/m         Assessment & Plan:  Lavona Mound in today with chief complaint of Knot on left side   1. Flank pain Motrin prior to bed time Moist heat to area.  RTO prn    The above assessment and management plan was discussed with the patient. The patient verbalized understanding of and has agreed to the management plan. Patient is aware to call the clinic if symptoms persist or worsen. Patient is aware when to return to the clinic for a follow-up visit. Patient educated on when it is appropriate to go to the emergency department.   Mary-Margaret Daphine Deutscher, FNP

## 2023-01-22 ENCOUNTER — Ambulatory Visit (INDEPENDENT_AMBULATORY_CARE_PROVIDER_SITE_OTHER): Payer: Medicare Other

## 2023-01-22 VITALS — Ht 69.0 in | Wt 227.0 lb

## 2023-01-22 DIAGNOSIS — Z0000 Encounter for general adult medical examination without abnormal findings: Secondary | ICD-10-CM | POA: Diagnosis not present

## 2023-01-22 NOTE — Progress Notes (Signed)
 Because this visit was a virtual/telehealth visit,  certain criteria was not obtained, such a blood pressure, CBG if patient is a diabetic, and timed up and go. Any medications not marked as "taking" was not mentioned during the medication reconciliation part of the visit. Any vitals not documented were not able to be obtained due to this being a telehealth visit. Vitals documented are verbally provided by the patient.   Subjective:   Tyler Barton is a 52 y.o. male who presents for Medicare Annual/Subsequent preventive examination.  Visit Complete: Virtual  I connected with  Lavona Mound on 01/22/23 by a audio enabled telemedicine application and verified that I am speaking with the correct person using two identifiers.  Patient Location: Home  Provider Location: Home Office  I discussed the limitations of evaluation and management by telemedicine. The patient expressed understanding and agreed to proceed.  Patient Medicare AWV questionnaire was completed by the patient on n/a; I have confirmed that all information answered by patient is correct and no changes since this date.  Review of Systems     Cardiac Risk Factors include: advanced age (>79men, >6 women);dyslipidemia;hypertension;male gender;obesity (BMI >30kg/m2)     Objective:    Today's Vitals   01/22/23 1320  Weight: 227 lb (103 kg)  Height: 5\' 9"  (1.753 m)   Body mass index is 33.52 kg/m.     01/22/2023    1:31 PM 07/18/2022    8:02 PM 01/19/2022    9:06 AM 01/16/2021    9:24 AM 10/17/2019    8:43 AM 03/08/2018   11:07 AM  Advanced Directives  Does Patient Have a Medical Advance Directive? No No No No No No  Would patient like information on creating a medical advance directive? No - Patient declined No - Patient declined No - Patient declined No - Patient declined No - Patient declined Yes (MAU/Ambulatory/Procedural Areas - Information given)    Current Medications (verified) Outpatient Encounter Medications  as of 01/22/2023  Medication Sig   buPROPion (WELLBUTRIN XL) 150 MG 24 hr tablet Take 1 tablet (150 mg total) by mouth daily.   celecoxib (CELEBREX) 200 MG capsule Take 1 capsule by mouth twice daily   diazepam (VALIUM) 5 MG tablet Take 1 tablet (5 mg total) by mouth every 12 (twelve) hours as needed. for anxiety   famotidine (PEPCID) 20 MG tablet Take 1 tablet (20 mg total) by mouth 2 (two) times daily.   fluticasone (FLONASE) 50 MCG/ACT nasal spray Place 2 sprays into both nostrils daily.   lisinopril (ZESTRIL) 10 MG tablet Take 1 tablet (10 mg total) by mouth daily.   loratadine (EQ ALLERGY RELIEF) 10 MG tablet Take 1 tablet (10 mg total) by mouth daily.   meclizine (ANTIVERT) 50 MG tablet Take 1 tablet (50 mg total) by mouth 3 (three) times daily as needed.   nystatin cream (MYCOSTATIN) Apply 1 Application topically 2 (two) times daily.   tadalafil (CIALIS) 20 MG tablet Take 1 tablet (20 mg total) by mouth as needed for erectile dysfunction.   triamcinolone ointment (KENALOG) 0.5 % Apply 1 Application topically 2 (two) times daily.   No facility-administered encounter medications on file as of 01/22/2023.    Allergies (verified) Patient has no known allergies.   History: Past Medical History:  Diagnosis Date   Anxiety    COVID-19 virus infection 04/2020   a. Ss onset 04/23/2020.   DDD (degenerative disc disease)    Morbid obesity [facility])    Past Surgical  History:  Procedure Laterality Date   KNEE ARTHROSCOPY Left 1993   LIPOMA EXCISION     Family History  Problem Relation Age of Onset   Anxiety disorder Mother    Anxiety disorder Father    Heart disease Father 53       with stent placement and bypass   Anxiety disorder Sister    Cancer Maternal Uncle        throat   Alzheimer's disease Maternal Grandmother    Cancer Paternal Grandmother    Heart disease Maternal Uncle    Heart disease Maternal Uncle    Social History   Socioeconomic History   Marital status: Married     Spouse name: Boyd Kerbs   Number of children: 4   Years of education: 10   Highest education level: 10th grade  Occupational History   Occupation: disabled    Comment: truck driver  Tobacco Use   Smoking status: Never   Smokeless tobacco: Never  Vaping Use   Vaping status: Never Used  Substance and Sexual Activity   Alcohol use: Yes    Comment: ocassionally - rare   Drug use: No   Sexual activity: Yes  Other Topics Concern   Not on file  Social History Narrative   [place] resides locally in [place] with his wife and step son. He has four children of his own and one was adopted because he had the child at the early age of 10. He has two step children. 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 He enjoys working in his shop and piddling with cars. He has recently been diagnosed with a hernia and this is keeping him from doing anything strenuous.    Social Determinants of Health   Financial Resource Strain: Low Risk  (01/22/2023)   Overall Financial Resource Strain (CARDIA)    Difficulty of Paying Living Expenses: Not very hard  Food Insecurity: No Food Insecurity (01/22/2023)   Hunger Vital Sign    Worried About Running Out of Food in the Last Year: Never true    Ran Out of Food in the Last Year: Never true  Transportation Needs: No Transportation Needs (01/22/2023)   PRAPARE - Administrator, Civil Service (Medical): No    Lack of Transportation (Non-Medical): No  Physical Activity: Inactive (01/22/2023)   Exercise Vital Sign    Days of Exercise per Week: 0 days    Minutes of Exercise per Session: 0 min  Stress: No Stress Concern Present (01/22/2023)   [employer] of Occupational Health - Occupational Stress Questionnaire    Feeling of Stress : Not at all  Social Connections: Moderately Isolated (01/22/2023)   Social Connection and Isolation Panel [NHANES]    Frequency of Communication with Friends and Family: More than three times a week     Frequency of Social Gatherings with Friends and Family: More than three times a week    Attends Religious Services: Never    Database administrator or Organizations: No    Attends Engineer, structural: Never    Marital Status: Married    Tobacco Counseling Counseling given: Yes   Clinical Intake:  Pre-visit preparation completed: Yes  Pain : No/denies pain     Nutritional Risks: None Diabetes: No  How often do you need to have someone help you when you read instructions, pamphlets, or other written materials from your doctor or pharmacy?: 1 - Never  Interpreter Needed?: No  Information  entered by ::  Sundee Garland, CMA   Activities of Daily Living    01/22/2023    1:25 PM  In your present state of health, do you have any difficulty performing the following activities:  Hearing? 0  Vision? 0  Difficulty concentrating or making decisions? 0  Walking or climbing stairs? 0  Dressing or bathing? 0  Doing errands, shopping? 0  Preparing Food and eating ? N  Using the Toilet? N  In the past six months, have you accidently leaked urine? N  Do you have problems with loss of bowel control? N  Managing your Medications? N  Managing your Finances? N  Housekeeping or managing your Housekeeping? N    Patient Care Team: Bennie Pierini, FNP as PCP - General (Family Medicine)  Indicate any recent Medical Services you may have received from other than [facility] providers in the past year (date may be approximate).     Assessment:   This is a routine wellness examination for Lupe.  Hearing/Vision screen Hearing Screening - Comments:: Patient denies any hearing difficulties.    Dietary issues and exercise activities discussed:     Goals Addressed               This Visit's Progress     Patient Stated (pt-stated)        Patient states his goal is to lose weight and to remain active.        Depression Screen    01/22/2023    1:33 PM 01/21/2023    12:20 PM 01/15/2023    8:07 AM 08/26/2022   11:27 AM 07/27/2022    8:03 AM 07/17/2022    8:12 AM 02/06/2022    8:27 AM  PHQ 2/9 Scores  PHQ - 2 Score 0 0 0 0 0 1 0  PHQ- 9 Score 0 2 2 3  0 4 3    Fall Risk    01/22/2023    1:31 PM 01/21/2023   12:20 PM 01/15/2023    8:07 AM 08/26/2022   11:27 AM 02/06/2022    8:27 AM  Fall Risk   Falls in the past year? 0 0 0 0 0  Number falls in past yr: 0      Injury with Fall? 0      Risk for fall due to : No Fall Risks      Follow up Falls prevention discussed        MEDICARE RISK AT HOME:  Medicare Risk at Home - 01/22/23 1336     Any stairs in or around the home? No    If so, are there any without handrails? No    Home free of loose throw rugs in walkways, pet beds, electrical cords, etc? Yes    Adequate lighting in your home to reduce risk of falls? Yes    Life alert? No    Use of a cane, walker or w/c? No    Grab bars in the bathroom? No    Shower chair or bench in shower? No    Elevated toilet seat or a handicapped toilet? No             TIMED UP AND GO:  Was the test performed?  No    Cognitive Function:    03/08/2018    5:01 PM  MMSE - Mini Mental State Exam  Not completed: Unable to complete        01/22/2023    1:36 PM 01/19/2022    9:07  AM 10/17/2019    8:44 AM  6CIT Screen  What Year? 0 points 0 points 0 points  What month? 0 points 0 points 0 points  What time? 0 points 0 points 0 points  Count back from 20 0 points 0 points 0 points  Months in reverse 0 points 4 points 0 points  Repeat phrase 0 points 2 points 0 points  Total Score 0 points 6 points 0 points    Immunizations  There is no immunization history on file for this patient.  TDAP status: Due, Education has been provided regarding the importance of this vaccine. Advised may receive this vaccine at local pharmacy or Health Dept. Aware to provide a copy of the vaccination record if obtained from local pharmacy or Health Dept. Verbalized acceptance and  understanding.  Flu Vaccine status: Up to date  Pneumococcal vaccine status: NOT AGE APPROPRIATE FOR THIS PATIENT  Covid-19 vaccine status: Information provided on how to obtain vaccines.   Qualifies for Shingles Vaccine? Yes   Zostavax completed No   Shingrix Completed?: No.    Education has been provided regarding the importance of this vaccine. Patient has been advised to call insurance company to determine out of pocket expense if they have not yet received this vaccine. Advised may also receive vaccine at local pharmacy or Health Dept. Verbalized acceptance and understanding.  Screening Tests Health Maintenance  Topic Date Due   DTaP/Tdap/Td (1 - Tdap) Never done   Medicare Annual Wellness (AWV)  01/20/2023   COVID-19 Vaccine (1 - 2023-24 season) 01/31/2023 (Originally 03/06/2022)   Zoster Vaccines- Shingrix (1 of 2) 04/17/2023 (Originally 03/13/2020)   INFLUENZA VACCINE  02/04/2023   Fecal DNA (Cologuard)  10/28/2024   Hepatitis C Screening  Completed   HIV Screening  Completed   HPV VACCINES  Aged Out    Health Maintenance  Health Maintenance Due  Topic Date Due   DTaP/Tdap/Td (1 - Tdap) Never done   Medicare Annual Wellness (AWV)  01/20/2023    Colorectal cancer screening: Type of screening: Cologuard. Completed 10/28/2021. Repeat every 3 years  Lung Cancer Screening: (Low Dose CT Chest recommended if Age 79-80 years, 20 pack-year currently smoking OR have quit w/in 15years.) does not qualify.   Lung Cancer Screening Referral: n/a  Additional Screening:  Hepatitis C Screening: does not qualify; Completed 08/11/2016  Vision Screening: Recommended annual ophthalmology exams for early detection of glaucoma and other disorders of the eye. Is the patient up to date with their annual eye exam?  Yes  Who is the provider or what is the name of the office in which the patient attends annual eye exams? [facility] eye exam requested If pt is not established with a  provider, would they like to be referred to a provider to establish care? No .   Dental Screening: Recommended annual dental exams for proper oral hygiene  Diabetic Foot Exam: n/a  Community Resource Referral / Chronic Care Management: CRR required this visit?  No   CCM required this visit?  No     Plan:     I have personally reviewed and noted the following in the patient's chart:   Medical and social history Use of alcohol, tobacco or illicit drugs  Current medications and supplements including opioid prescriptions. Patient is not currently taking opioid prescriptions. Functional ability and status Nutritional status Physical activity Advanced directives List of other physicians Hospitalizations, surgeries, and ER visits in previous 12 months Vitals Screenings to include cognitive,  depression, and falls Referrals and appointments  In addition, I have reviewed and discussed with patient certain preventive protocols, quality metrics, and best practice recommendations. A written personalized care plan for preventive services as well as general preventive health recommendations were provided to patient.     Jordan Hawks Eleftheria Taborn, CMA   01/22/2023   After Visit Summary: (MyChart) Due to this being a telephonic visit, the after visit summary with patients personalized plan was offered to patient via MyChart   Nurse Notes:

## 2023-01-22 NOTE — Patient Instructions (Signed)
Tyler Barton , Thank you for taking time to come for your Medicare Wellness Visit. I appreciate your ongoing commitment to your health goals. Please review the following plan we discussed and let me know if I can assist you in the future.   These are the goals we discussed:  Goals       Exercise 150 min/wk Moderate Activity      Manage My Cholesterol      Timeframe:  Long-Range Goal Priority:  High Start Date:                             Expected End Date:                       Follow Up Date 01/17/22    - change to whole grain breads, cereal, pasta - eat smaller or less servings of red meat - fill half the plate with nonstarchy vegetables - read food labels for fat and fiber    Why is this important?   Changing cholesterol starts with eating heart-healthy foods.  Other steps may be to increase your activity and to quit if you smoke.       Patient Stated (pt-stated)      Patient states his goal is to lose weight and to remain active.         This is a list of the screening recommended for you and due dates:  Health Maintenance  Topic Date Due   DTaP/Tdap/Td vaccine (1 - Tdap) Never done   COVID-19 Vaccine (1 - 2023-24 season) 01/31/2023*   Zoster (Shingles) Vaccine (1 of 2) 04/17/2023*   Flu Shot  02/04/2023   Medicare Annual Wellness Visit  01/22/2024   Cologuard (Stool DNA test)  10/28/2024   Hepatitis C Screening  Completed   HIV Screening  Completed   HPV Vaccine  Aged Out  *Topic was postponed. The date shown is not the original due date.    Advanced directives: Advance directive discussed with you today. Even though you declined this today, please call our office should you change your mind, and we can give you the proper paperwork for you to fill out. Advance care planning is a way to make decisions about medical care that fits your values in case you are ever unable to make these decisions for yourself.  Information on Advanced Care Planning can be found at [facility] [place] Advance Health Care Directives Advance Health Care Directives ([email or web address]    Conditions/risks identified: Aim for 30 minutes of exercise or brisk walking, 6-8 glasses of water, and 5 servings of fruits and vegetables each day.    Next appointment: VIRTUAL/ TELEPHONE VISIT Follow up in one year for your annual wellness visit  January 24, 2024 at 10:30 am TELEPHONE VISIT   Preventive Care 23 Years and Older, Male  Preventive care refers to lifestyle choices and visits with your health care provider that can promote health and wellness. What does preventive care include? A yearly physical exam. This is also called an annual well check. Dental exams once or twice a year. Routine eye exams. Ask your health care provider how often you should have your eyes checked. Personal lifestyle choices, including: Daily care of your teeth and gums. Regular physical activity. Eating a healthy diet. Avoiding tobacco and drug use. Limiting alcohol use. Practicing safe sex. Taking low doses of aspirin every day. Taking vitamin and  mineral supplements as recommended by your health care provider. What happens during an annual well check? The services and screenings done by your health care provider during your annual well check will depend on your age, overall health, lifestyle risk factors, and family history of disease. Counseling  Your health care provider may ask you questions about your: Alcohol use. Tobacco use. Drug use. Emotional well-being. Home and relationship well-being. Sexual activity. Eating habits. History of falls. Memory and ability to understand (cognition). Work and work Astronomer. Screening  You may have the following tests or measurements: Height, weight, and BMI. Blood pressure. Lipid and cholesterol levels. These may be checked every 5 years, or more frequently if you are over 36 years old. Skin check. Lung cancer screening. You may have  this screening every year starting at age 6 if you have a 30-pack-year history of smoking and currently smoke or have quit within the past 15 years. Fecal occult blood test (FOBT) of the stool. You may have this test every year starting at age 71. Flexible sigmoidoscopy or colonoscopy. You may have a sigmoidoscopy every 5 years or a colonoscopy every 10 years starting at age 22. Prostate cancer screening. Recommendations will vary depending on your family history and other risks. Hepatitis C blood test. Hepatitis B blood test. Sexually transmitted disease (STD) testing. Diabetes screening. This is done by checking your blood sugar (glucose) after you have not eaten for a while (fasting). You may have this done every 1-3 years. Abdominal aortic aneurysm (AAA) screening. You may need this if you are a current or former smoker. Osteoporosis. You may be screened starting at age 53 if you are at high risk. Talk with your health care provider about your test results, treatment options, and if necessary, the need for more tests. Vaccines  Your health care provider may recommend certain vaccines, such as: Influenza vaccine. This is recommended every year. Tetanus, diphtheria, and acellular pertussis (Tdap, Td) vaccine. You may need a Td booster every 10 years. Zoster vaccine. You may need this after age 70. Pneumococcal 13-valent conjugate (PCV13) vaccine. One dose is recommended after age 30. Pneumococcal polysaccharide (PPSV23) vaccine. One dose is recommended after age 39. Talk to your health care provider about which screenings and vaccines you need and how often you need them. This information is not intended to replace advice given to you by your health care provider. Make sure you discuss any questions you have with your health care provider. Document Released: 07/19/2015 Document Revised: 03/11/2016 Document Reviewed: 04/23/2015 Elsevier Interactive Patient Education  2017 [employer].  Fall Prevention in the Home Falls can cause injuries. They can happen to people of all ages. There are many things you can do to make your home safe and to help prevent falls. What can I do on the outside of my home? Regularly fix the edges of walkways and driveways and fix any cracks. Remove anything that might make you trip as you walk through a door, such as a raised step or threshold. Trim any bushes or trees on the path to your home. Use bright outdoor lighting. Clear any walking paths of anything that might make someone trip, such as rocks or tools. Regularly check to see if handrails are loose or broken. Make sure that both sides of any steps have handrails. Any raised decks and porches should have guardrails on the edges. Have any leaves, snow, or ice cleared regularly. Use sand or salt on walking paths during winter. Clean up  any spills in your garage right away. This includes oil or grease spills. What can I do in the bathroom? Use night lights. Install grab bars by the toilet and in the tub and shower. Do not use towel bars as grab bars. Use non-skid mats or decals in the tub or shower. If you need to sit down in the shower, use a plastic, non-slip stool. Keep the floor dry. Clean up any water that spills on the floor as soon as it happens. Remove soap buildup in the tub or shower regularly. Attach bath mats securely with double-sided non-slip rug tape. Do not have throw rugs and other things on the floor that can make you trip. What can I do in the bedroom? Use night lights. Make sure that you have a light by your bed that is easy to reach. Do not use any sheets or blankets that are too big for your bed. They should not hang down onto the floor. Have a firm chair that has side arms. You can use this for support while you get dressed. Do not have throw rugs and other things on the floor that can make you trip. What can I do in the kitchen? Clean up any spills right  away. Avoid walking on wet floors. Keep items that you use a lot in easy-to-reach places. If you need to reach something above you, use a strong step stool that has a grab bar. Keep electrical cords out of the way. Do not use floor polish or wax that makes floors slippery. If you must use wax, use non-skid floor wax. Do not have throw rugs and other things on the floor that can make you trip. What can I do with my stairs? Do not leave any items on the stairs. Make sure that there are handrails on both sides of the stairs and use them. Fix handrails that are broken or loose. Make sure that handrails are as long as the stairways. Check any carpeting to make sure that it is firmly attached to the stairs. Fix any carpet that is loose or worn. Avoid having throw rugs at the top or bottom of the stairs. If you do have throw rugs, attach them to the floor with carpet tape. Make sure that you have a light switch at the top of the stairs and the bottom of the stairs. If you do not have them, ask someone to add them for you. What else can I do to help prevent falls? Wear shoes that: Do not have high heels. Have rubber bottoms. Are comfortable and fit you well. Are closed at the toe. Do not wear sandals. If you use a stepladder: Make sure that it is fully opened. Do not climb a closed stepladder. Make sure that both sides of the stepladder are locked into place. Ask someone to hold it for you, if possible. Clearly mark and make sure that you can see: Any grab bars or handrails. First and last steps. Where the edge of each step is. Use tools that help you move around (mobility aids) if they are needed. These include: Canes. Walkers. Scooters. Crutches. Turn on the lights when you go into a dark area. Replace any light bulbs as soon as they burn out. Set up your furniture so you have a clear path. Avoid moving your furniture around. If any of your floors are uneven, fix them. If there are any  pets around you, be aware of where they are. Review your medicines with your  doctor. Some medicines can make you feel dizzy. This can increase your chance of falling. Ask your doctor what other things that you can do to help prevent falls. This information is not intended to replace advice given to you by your health care provider. Make sure you discuss any questions you have with your health care provider. Document Released: 04/18/2009 Document Revised: 11/28/2015 Document Reviewed: 07/27/2014 Elsevier Interactive Patient Education  2017 [employer].

## 2023-01-26 ENCOUNTER — Other Ambulatory Visit: Payer: Self-pay | Admitting: Nurse Practitioner

## 2023-01-26 DIAGNOSIS — M25551 Pain in right hip: Secondary | ICD-10-CM

## 2023-02-27 ENCOUNTER — Other Ambulatory Visit: Payer: Self-pay | Admitting: Nurse Practitioner

## 2023-02-27 DIAGNOSIS — M25551 Pain in right hip: Secondary | ICD-10-CM

## 2023-03-01 ENCOUNTER — Emergency Department (HOSPITAL_COMMUNITY)
Admission: EM | Admit: 2023-03-01 | Discharge: 2023-03-01 | Disposition: A | Discharge status: Home / Self Care | Payer: Medicare Other

## 2023-03-01 ENCOUNTER — Encounter (HOSPITAL_COMMUNITY): Payer: Self-pay | Admitting: *Deleted

## 2023-03-01 ENCOUNTER — Emergency Department (HOSPITAL_COMMUNITY): Payer: Medicare Other

## 2023-03-01 ENCOUNTER — Other Ambulatory Visit: Payer: Self-pay

## 2023-03-01 DIAGNOSIS — R0789 Other chest pain: Secondary | ICD-10-CM | POA: Diagnosis not present

## 2023-03-01 DIAGNOSIS — R7309 Other abnormal glucose: Secondary | ICD-10-CM | POA: Insufficient documentation

## 2023-03-01 DIAGNOSIS — I10 Essential (primary) hypertension: Secondary | ICD-10-CM | POA: Diagnosis not present

## 2023-03-01 DIAGNOSIS — R079 Chest pain, unspecified: Secondary | ICD-10-CM

## 2023-03-01 LAB — BASIC METABOLIC PANEL
Anion gap: 8 (ref 5–15)
BUN: 11 mg/dL (ref 6–20)
CO2: 25 mmol/L (ref 22–32)
Calcium: 8.5 mg/dL — ABNORMAL LOW (ref 8.9–10.3)
Chloride: 103 mmol/L (ref 98–111)
Creatinine, Ser: 1.03 mg/dL (ref 0.61–1.24)
GFR, Estimated: >60 mL/min (ref >60)
Glucose, Bld: 104 mg/dL — ABNORMAL HIGH (ref 70–99)
Potassium: 3.2 mmol/L — ABNORMAL LOW (ref 3.5–5.1)
Sodium: 136 mmol/L (ref 135–145)

## 2023-03-01 LAB — CBC
HCT: 41.2 % (ref 39.0–52.0)
Hemoglobin: 13.5 g/dL (ref 13.0–17.0)
MCH: 29.9 pg (ref 26.0–34.0)
MCHC: 32.8 g/dL (ref 30.0–36.0)
MCV: 91.4 fL (ref 80.0–100.0)
Platelets: 291 10*3/uL (ref 150–400)
RBC: 4.51 MIL/uL (ref 4.22–5.81)
RDW: 13.2 % (ref 11.5–15.5)
WBC: 4.3 10*3/uL (ref 4.0–10.5)
nRBC: 0 % (ref 0.0–0.2)

## 2023-03-01 LAB — CBG MONITORING, ED: Glucose-Capillary: 110 mg/dL — ABNORMAL HIGH (ref 70–99)

## 2023-03-01 LAB — TROPONIN I (HIGH SENSITIVITY): Troponin I (High Sensitivity): 3 ng/L (ref <18)

## 2023-03-01 MED ORDER — ASPIRIN 81 MG PO CHEW
324.0000 mg | CHEWABLE_TABLET | Freq: Once | ORAL | Status: AC
  Administered 2023-03-01: 324 mg via ORAL
  Filled 2023-03-01: qty 4

## 2023-03-01 MED ORDER — KETOROLAC TROMETHAMINE 15 MG/ML IJ SOLN
15.0000 mg | Freq: Once | INTRAMUSCULAR | Status: AC
  Administered 2023-03-01: 15 mg via INTRAVENOUS
  Filled 2023-03-01: qty 1

## 2023-03-01 NOTE — ED Provider Notes (Signed)
[facility] EMERGENCY DEPARTMENT AT [facility] Provider Note   CSN: [id number] Arrival date & time: 03/01/23  4098     History  Chief Complaint  Patient presents with   Chest Pain    Tyler Barton is a 52 y.o. male.  52 year old male with past medical history of hypertension, hyperlipidemia, and anxiety with positive family history for coronary artery disease presents emergency department today with left-sided chest pain.  Patient states this began last night.  He states that it is a dull pain in the left side of his chest.  He states that it does not radiate.  He denies any fevers, chills, or cough.  The patient denies a history of DVT or pulmonary embolism, recent surgeries, recent travel.  He denies any hemoptysis.  He denies any leg pain or swelling.  He came to the emergency department today due to these ongoing symptoms.  He states this started around 7 PM last night.  He states that he woke up this morning and has improved but is still there.  He came to the emergency department for further evaluation.  Does report positive family history of coronary artery disease on both sides of his family.  The history is provided by the patient.  Chest Pain      Home Medications Prior to Admission medications   Medication Sig Start Date End Date Taking? Authorizing Provider  buPROPion (WELLBUTRIN XL) 150 MG 24 hr tablet Take 1 tablet (150 mg total) by mouth daily. 01/15/23   Daphine Deutscher Mary-Margaret, FNP  celecoxib (CELEBREX) 200 MG capsule Take 1 capsule by mouth twice daily 03/01/23   Daphine Deutscher, Mary-Margaret, FNP  diazepam (VALIUM) 5 MG tablet Take 1 tablet (5 mg total) by mouth every 12 (twelve) hours as needed. for anxiety 01/15/23   Bennie Pierini, FNP  famotidine (PEPCID) 20 MG tablet Take 1 tablet (20 mg total) by mouth 2 (two) times daily. 01/15/23   Daphine Deutscher, Mary-Margaret, FNP  fluticasone (FLONASE) 50 MCG/ACT nasal spray Place 2 sprays into both nostrils daily. 10/08/20    Daphine Deutscher, Mary-Margaret, FNP  lisinopril (ZESTRIL) 10 MG tablet Take 1 tablet (10 mg total) by mouth daily. 01/15/23   Daphine Deutscher Mary-Margaret, FNP  loratadine (EQ ALLERGY RELIEF) 10 MG tablet Take 1 tablet (10 mg total) by mouth daily. 03/11/21   Daphine Deutscher, Mary-Margaret, FNP  meclizine (ANTIVERT) 50 MG tablet Take 1 tablet (50 mg total) by mouth 3 (three) times daily as needed. 10/28/22   Mechele Claude, MD  nystatin cream (MYCOSTATIN) Apply 1 Application topically 2 (two) times daily. 08/26/22   Gabriel Earing, FNP  tadalafil (CIALIS) 20 MG tablet Take 1 tablet (20 mg total) by mouth as needed for erectile dysfunction. 01/15/23   Daphine Deutscher Mary-Margaret, FNP  triamcinolone ointment (KENALOG) 0.5 % Apply 1 Application topically 2 (two) times daily. 07/27/22   Gabriel Earing, FNP      Allergies    Patient has no known allergies.    Review of Systems   Review of Systems  Cardiovascular:  Positive for chest pain.  Gen: No fevers Eyes: No vision changes HEENT: no congestion, sore throat Neck: no neck stiffness Resp: no cough, shortness of breath Card: See HPI Abd: no nausea or vomiting, no abdominal pain Extremities: no leg swelling Neuro: no weakness, numbness, tingling Skin: no rashes   Physical Exam Updated Vital Signs BP 126/84   Pulse 71   Temp 98.1 F (36.7 C) (Oral)   Resp 16   Ht 5\' 10"  (  1.778 m)   Wt 98.9 kg   SpO2 94%   BMI 31.28 kg/m  Physical Exam Vitals and nursing note reviewed.   Gen: NAD Eyes: PERRL, EOMI HEENT: no oropharyngeal swelling Neck: trachea midline Resp: clear to auscultation bilaterally Card: RRR, no murmurs, rubs, or gallops Abd: nontender, nondistended Extremities: no calf tenderness, no edema Neuro: No focal deficits Vascular: 2+ radial pulses bilaterally, 2+ DP pulses bilaterally Skin: no rashes Psyc: acting appropriately   ED Results / Procedures / Treatments   Labs (all labs ordered are listed, but only abnormal results are  displayed) Labs Reviewed  BASIC METABOLIC PANEL - Abnormal; Notable for the following components:      Result Value   Potassium 3.2 (*)    Glucose, Bld 104 (*)    Calcium 8.5 (*)    All other components within normal limits  CBG MONITORING, ED - Abnormal; Notable for the following components:   Glucose-Capillary 110 (*)    All other components within normal limits  CBC  TROPONIN I (HIGH SENSITIVITY)    EKG EKG Interpretation Date/Time:  Monday March 01 2023 08:15:01 EDT Ventricular Rate:  70 PR Interval:  186 QRS Duration:  95 QT Interval:  397 QTC Calculation: 429 R Axis:   27  Text Interpretation: Sinus rhythm Abnormal R-wave progression, early transition Abnormal inferior Q waves Confirmed by Beckey Downing [id number]) on 03/01/2023 8:44:08 AM  Radiology DG Chest Portable 1 View  Result Date: 03/01/2023 CLINICAL DATA:  Chest pain EXAM: PORTABLE CHEST 1 VIEW COMPARISON:  05/07/2020 FINDINGS: Lungs are clear.  No pleural effusion or pneumothorax The heart is normal in size. IMPRESSION: No acute cardiopulmonary disease. Electronically Signed   By: Charline Bills M.D.   On: 03/01/2023 08:52    Procedures Procedures    Medications Ordered in ED Medications  ketorolac (TORADOL) 15 MG/ML injection 15 mg (has no administration in time range)  aspirin chewable tablet 324 mg (324 mg Oral Given 03/01/23 6045)    ED Course/ Medical Decision Making/ A&P                                 Medical Decision Making 52 year old male with past medical history of hypertension hyperlipidemia with positive family history for coronary artery disease presents the emergency department today with left-sided chest discomfort.  I will further evaluate the patient here with basic labs as well as an EKG, chest x-ray, and troponin for further evaluation for ACS, pulmonary edema, pulmonary infiltrates, or pneumothorax.  Based on the description of his symptoms and lack of risk factors suspicion for  pulmonary embolism is low at this time.  May be due to musculoskeletal pain which courses a diagnosis of exclusion.  Given his risk factors I do think that further evaluation in regards to cardiac etiology would be warranted but I will further evaluate him here after his workup to determine whether this should be done as an inpatient or outpatient.  The patient's EKG interpreted by me shows a sinus rhythm with a rate of 78 with normal axis, normal intervals, no significant acute ST-T changes.  There are some Q waves in the inferior leads.  The patient's initial troponin is negative.  His heart score is a 3.  He remained stable here.  Patient given Toradol for potential musculoskeletal pain.  The patient is stable for discharge with cardiology follow-up for further testing as an outpatient.  Amount and/or Complexity  of Data Reviewed Labs: ordered. Radiology: ordered.  Risk OTC drugs. Prescription drug management.           Final Clinical Impression(s) / ED Diagnoses Final diagnoses:  Nonspecific chest pain  Disposition: discharge  Rx / DC Orders ED Discharge Orders          Ordered    Ambulatory referral to Cardiology       Comments: If you have not heard from the Cardiology office within the next 72 hours please call [phone].   03/01/23 6301              Durwin Glaze, MD 03/01/23 [phone]

## 2023-03-01 NOTE — ED Triage Notes (Signed)
Pt c/o left side chest pain that started last night; pt states the pain started in his neck and moved down to his chest  Pt denies any other sx

## 2023-03-01 NOTE — Discharge Instructions (Signed)
Your heart tests and labs were reassuring today.  Please try anti-inflammatory medication such as Aleve or Celebrex for the pain to see if this helps.  You may also want to try some antacids in the event that this is due to a GI cause.  I have placed a consult to cardiology.  You should receive a call in the next few days regarding follow-up.  Please follow-up to see about further testing as an outpatient.  Return to the ER for worsening symptoms.

## 2023-03-09 ENCOUNTER — Ambulatory Visit: Payer: Medicare Other | Attending: Internal Medicine | Admitting: Internal Medicine

## 2023-03-09 ENCOUNTER — Encounter: Payer: Self-pay | Admitting: Internal Medicine

## 2023-03-09 VITALS — BP 122/78 | HR 72 | Ht 70.0 in | Wt 220.0 lb

## 2023-03-09 DIAGNOSIS — R079 Chest pain, unspecified: Secondary | ICD-10-CM

## 2023-03-09 DIAGNOSIS — E785 Hyperlipidemia, unspecified: Secondary | ICD-10-CM | POA: Insufficient documentation

## 2023-03-09 DIAGNOSIS — Z8249 Family history of ischemic heart disease and other diseases of the circulatory system: Secondary | ICD-10-CM

## 2023-03-09 DIAGNOSIS — E782 Mixed hyperlipidemia: Secondary | ICD-10-CM

## 2023-03-09 NOTE — Progress Notes (Signed)
Cardiology Office Note  Date: 03/09/2023   ID: HAOXUAN Tyler Barton, DOB 23-Jul-1970, MRN [id number]  PCP:  Bennie Pierini, FNP  Cardiologist:  Marjo Bicker, MD Electrophysiologist:  None   History of Present Illness: Tyler Barton is a 52 y.o. male with HTN was referred to cardiology clinic for exertional chest pain.  Patient had ongoing pain in the left side of his chest radiating to the left shoulder, exertional, lasting for few minutes and resolved with rest.  Started last week prompting ER visit, EKG and troponins within normal limits.  Last episode of chest pain was 3 days ago.  He also has underlying anxiety and panic attack.  In the past, he was getting chest pains with severe stress.  He also self reported that he is over thinker and once he started to let go of things, his chest pains got better in the last few days.  No DOE, presyncope, syncope, palpitations, leg swelling.  Denies smoking cigarettes.  He has a family history premature CAD, father had PCI at the age of 51 followed by CABG eventually.  Past Medical History:  Diagnosis Date   Anxiety    COVID-19 virus infection 04/2020   a. Ss onset 04/23/2020.   DDD (degenerative disc disease)    Morbid obesity (HCC)     Past Surgical History:  Procedure Laterality Date   KNEE ARTHROSCOPY Left 1993   LIPOMA EXCISION      Current Outpatient Medications  Medication Sig Dispense Refill   buPROPion (WELLBUTRIN XL) 150 MG 24 hr tablet Take 1 tablet (150 mg total) by mouth daily. 90 tablet 1   celecoxib (CELEBREX) 200 MG capsule Take 1 capsule by mouth twice daily 60 capsule 5   diazepam (VALIUM) 5 MG tablet Take 1 tablet (5 mg total) by mouth every 12 (twelve) hours as needed. for anxiety 60 tablet 5   famotidine (PEPCID) 20 MG tablet Take 1 tablet (20 mg total) by mouth 2 (two) times daily. 180 tablet 1   fluticasone (FLONASE) 50 MCG/ACT nasal spray Place 2 sprays into both nostrils daily. 16 g 5   lisinopril  (ZESTRIL) 10 MG tablet Take 1 tablet (10 mg total) by mouth daily. 90 tablet 1   loratadine (EQ ALLERGY RELIEF) 10 MG tablet Take 1 tablet (10 mg total) by mouth daily. 30 tablet 5   meclizine (ANTIVERT) 50 MG tablet Take 1 tablet (50 mg total) by mouth 3 (three) times daily as needed. 30 tablet 1   tadalafil (CIALIS) 20 MG tablet Take 1 tablet (20 mg total) by mouth as needed for erectile dysfunction. 45 tablet 3   No current facility-administered medications for this visit.   Allergies:  Patient has no known allergies.   Social History: The patient  reports that he has never smoked. He has never used smokeless tobacco. He reports current alcohol use. He reports that he does not use drugs.   Family History: The patient's family history includes Alzheimer's disease in his maternal grandmother; Anxiety disorder in his father, mother, and sister; Cancer in his maternal uncle and paternal grandmother; Heart disease in his maternal uncle and maternal uncle; Heart disease (age of onset: 8) in his father.   ROS:  Please see the history of present illness. Otherwise, complete review of systems is positive for none.  All other systems are reviewed and negative.   Physical Exam: VS:  BP 122/78   Pulse 72   Ht 5\' 10"  (1.778 m)   Wt  220 lb (99.8 kg)   SpO2 97%   BMI 31.57 kg/m , BMI Body mass index is 31.57 kg/m.  Wt Readings from Last 3 Encounters:  03/09/23 220 lb (99.8 kg)  03/01/23 218 lb (98.9 kg)  01/22/23 227 lb (103 kg)    General: Patient appears comfortable at rest. HEENT: Conjunctiva and lids normal, oropharynx clear with moist mucosa. Neck: Supple, no elevated JVP or carotid bruits, no thyromegaly. Lungs: Clear to auscultation, nonlabored breathing at rest. Cardiac: Regular rate and rhythm, no S3 or significant systolic murmur, no pericardial rub. Abdomen: Soft, nontender, no hepatomegaly, bowel sounds present, no guarding or rebound. Extremities: No pitting edema, distal pulses  2+. Skin: Warm and dry. Musculoskeletal: No kyphosis. Neuropsychiatric: Alert and oriented x3, affect grossly appropriate.  Recent Labwork: 01/15/2023: ALT 24; AST 39 03/01/2023: BUN 11; Creatinine, Ser 1.03; Hemoglobin 13.5; Platelets 291; Potassium 3.2; Sodium 136     Component Value Date/Time   CHOL 226 (H) 01/15/2023 0842   TRIG 186 (H) 01/15/2023 0842   HDL 40 01/15/2023 0842   CHOLHDL 5.7 (H) 01/15/2023 0842   LDLCALC 152 (H) 01/15/2023 0842     Assessment and Plan:  Possibly cardiac chest pain: Ongoing exertional pain in the left side of chest radiating to the left shoulder x last week prompting ER visit.  EKG and troponins within normal limits.  Last episode of chest pain was 3 days ago.  He also has underlying anxiety, panic attack and self-reported over thinker.  Has family history of premature CAD (father had PCI at the age of 2 and eventually had CABG).  Obtain Lexiscan (cannot walk on treadmill due to left leg issue).  Family history premature CAD (father had PCI at the age of 54 followed by CABG): Obtain lipoprotein a levels.  HLD, hypertriglyceridemia: I reviewed lipid panel from 01/2023 that showed elevated triglycerides, 186 and elevated LDL, 152.  Diet and exercise counseling provided.  Goal TG less than 150 and goal LDL less than 100 unless stress test returns abnormal.  Can follow-up with PCP for further management.  HTN, controlled: Continue lisinopril 10 mg once daily, follows with PCP.   Disposition:  Follow up pending results  Signed, Rimsha Trembley Verne Spurr, MD, 03/09/2023 2:17 PM     [facility] [address] [place], [place] [address]

## 2023-03-09 NOTE — Patient Instructions (Signed)
Medication Instructions:  Your physician recommends that you continue on your current medications as directed. Please refer to the Current Medication list given to you today.  *If you need a refill on your cardiac medications before your next appointment, please call your pharmacy*   Lab Work: LP(a)  If you have labs (blood work) drawn today and your tests are completely normal, you will receive your results only by: MyChart Message (if you have MyChart) OR A paper copy in the mail If you have any lab test that is abnormal or we need to change your treatment, we will call you to review the results.   Testing/Procedures: .Your physician has requested that you have a lexiscan myoview. For further information please visit [email or web address] Please follow instruction sheet, as given.    Follow-Up: At [facility], you and your health needs are our priority.  As part of our continuing mission to provide you with exceptional heart care, we have created designated Provider Care Teams.  These Care Teams include your primary Cardiologist (physician) and Advanced Practice Providers (APPs -  Physician Assistants and Nurse Practitioners) who all work together to provide you with the care you need, when you need it.  We recommend signing up for the patient portal called "MyChart".  Sign up information is provided on this After Visit Summary.  MyChart is used to connect with patients for Virtual Visits (Telemedicine).  Patients are able to view lab/test results, encounter notes, upcoming appointments, etc.  Non-urgent messages can be sent to your provider as well.   To learn more about what you can do with MyChart, go to [email or web address].    Your next appointment:    Follow up is Pending Testing Results  Provider:   Luane School, MD Other Instructions

## 2023-03-10 ENCOUNTER — Encounter: Payer: Self-pay | Admitting: Family Medicine

## 2023-03-10 ENCOUNTER — Telehealth: Payer: Self-pay | Admitting: Internal Medicine

## 2023-03-10 ENCOUNTER — Ambulatory Visit (INDEPENDENT_AMBULATORY_CARE_PROVIDER_SITE_OTHER): Payer: Medicare Other | Admitting: Family Medicine

## 2023-03-10 VITALS — BP 126/78 | HR 66 | Temp 97.9°F | Ht 70.0 in | Wt 220.0 lb

## 2023-03-10 DIAGNOSIS — R079 Chest pain, unspecified: Secondary | ICD-10-CM

## 2023-03-10 DIAGNOSIS — M79605 Pain in left leg: Secondary | ICD-10-CM

## 2023-03-10 MED ORDER — METHOCARBAMOL 500 MG PO TABS
500.0000 mg | ORAL_TABLET | Freq: Four times a day (QID) | ORAL | 0 refills | Status: AC | PRN
Start: 2023-03-10 — End: ?

## 2023-03-10 NOTE — Telephone Encounter (Signed)
Patient states he did his research and does not wish to have a nuclear stress test. He would like to know if he can have the treadmill test instead. Please advise.

## 2023-03-10 NOTE — Progress Notes (Signed)
   Acute Office Visit  Subjective:     Patient ID: Tyler Barton, male    DOB: 05/20/1971, 52 y.o.   MRN: [id number]  Chief Complaint  Patient presents with   Leg Pain    Leg Pain  There was no injury mechanism. The pain is present in the left thigh. The quality of the pain is described as aching. The pain has been Intermittent since onset. Pertinent negatives include no inability to bear weight, loss of motion, loss of sensation, muscle weakness, numbness or tingling. The symptoms are aggravated by movement (moving from sitting or laying to standing). He has tried NSAIDs for the symptoms. The treatment provided mild relief.   Pain has been for 1 week. Pain is down anterior upper left leg. Feels very tight after sitting or lying down. Hx of chronic lower back pain. Denies back pain for last week. No pain in hip. No injury. He does work in Aeronautical engineer however.   Review of Systems  Neurological:  Negative for tingling and numbness.        Objective:    BP 126/78   Pulse 66   Temp 97.9 F (36.6 C) (Temporal)   Ht 5\' 10"  (1.778 m)   Wt 220 lb (99.8 kg)   SpO2 98%   BMI 31.57 kg/m    Physical Exam Vitals and nursing note reviewed.  Constitutional:      General: He is not in acute distress.    Appearance: He is not ill-appearing, toxic-appearing or diaphoretic.  Pulmonary:     Effort: Pulmonary effort is normal. No respiratory distress.  Musculoskeletal:     Left hip: No tenderness or bony tenderness. Normal strength.     Left upper leg: No swelling, edema, deformity, lacerations, tenderness or bony tenderness.     Right lower leg: No edema.     Left lower leg: No edema.  Skin:    General: Skin is warm and dry.  Neurological:     General: No focal deficit present.     Mental Status: He is alert and oriented to person, place, and time.  Psychiatric:        Mood and Affect: Mood normal.        Behavior: Behavior normal.     No results found for any visits on  03/10/23.      Assessment & Plan:   Linkyn was seen today for leg pain.  Diagnoses and all orders for this visit:  Left leg pain Discussed quadriceps strain. Continue NSAIDs. Tryo robaxin as below. Discussed exercises, heat, stretching, massage. Discussed ortho referral if no improvement.  -     methocarbamol (ROBAXIN) 500 MG tablet; Take 1 tablet (500 mg total) by mouth every 6 (six) hours as needed for muscle spasms.   Return if symptoms worsen or fail to improve.  The patient indicates understanding of these issues and agrees with the plan.  Gabriel Earing, FNP

## 2023-03-10 NOTE — Patient Instructions (Signed)
Quadriceps Strain  A quadriceps strain is an injury to the muscles or tendons on the front of the thigh. The quadriceps muscles are used in straightening the knee and bending the hip. A strain occurs when the muscle is overstretched or overloaded. There are three types of strains: Grade 1 is a mild strain. It involves a stretching or minor tearing of your muscle fibers or tendons. You should have little, if any, trouble using your thigh. Grade 2 is a moderate strain. It involves a partial tearing of your muscle fibers or tendons. You will have pain and some loss of strength in your thigh. Grade 3 is a severe strain. It involves a complete tearing of your muscle fibers or tendons. It causes severe pain and loss of strength in your thigh. Recovery will take a few weeks or longer, depending on how bad your strain is. What are the causes? This injury is caused by overextending the muscles in the thigh. What increases the risk? The following factors may make you more likely to develop this injury: Participating in: Activities that involve jumping, sprinting, or sudden stopping or twisting. Contact sports, such as football or soccer. Having a previous injury to your thigh or knee. Having poor thigh strength and flexibility. Not warming up properly before activity. Having one leg that is much stronger than the other. Exercising to the point of exhaustion. What are the signs or symptoms? Symptoms of this condition include: Sudden, severe pain in your thigh. Pain and tenderness over your quadriceps muscles. The pain gets worse when you use these muscles. Muscle spasm in your thigh. Swelling in your thigh. Bruising. Having trouble with tasks that involve using your quadriceps muscles, such as walking. A crackling sound when the tendon is moved or touched. How is this diagnosed? This condition is diagnosed based on: A physical exam. Your medical history. Imaging tests, such  as: X-rays. Ultrasound. MRI. How is this treated? Treatment for this condition may include: Resting your leg and avoiding activities that cause pain. Taking medicine to help reduce pain and inflammation. Applying ice to the area to relieve swelling and inflammation. Elevating the leg to reduce or prevent swelling. Applying a compression wrap to the muscle. Using crutches until you can walk without pain. Working with a physical therapist on exercises to improve movement and strength in your thigh. In rare cases, surgery may be needed. Follow these instructions at home: Managing pain, stiffness, and swelling  If directed, put ice on the injured area. To do this: Put ice in a plastic bag. Place a towel between your skin and the bag. Leave the ice on for 20 minutes, 2-3 times a day. Remove the ice if your skin turns bright red. This is very important. If you cannot feel pain, heat, or cold, you have a greater risk of damage to the area. Raise (elevate) the injured area above the level of your heart while you are sitting or lying down. Activity Do not use the injured leg to support your body weight until your health care provider says that you can. Use crutches as told by your health care provider. Do exercises as told by your health care provider. Return to your normal activities as told by your health care provider. Ask your health care provider what activities are safe for you. General instructions Take over-the-counter and prescription medicines only as told by your health care provider. Use compression wraps to apply pressure as told by your health care provider. Keep all follow-up visits.  This is important. How is this prevented? Warm up and stretch before being active. Cool down and stretch after being active. Give your body time to rest between periods of activity. Maintain physical fitness, including: Strength. Flexibility. Be safe and responsible while being active. This  will help you avoid falls. Contact a health care provider if: Your pain, bruising, or tenderness gets worse, even with treatment. Your leg becomes weaker. Summary A quadriceps strain is an injury to the muscles or tendons on the front of the thigh. This injury is caused by overextending the muscles in the thigh. Treatment may include rest, ice, medicines, and physical therapy. In rare cases, surgery may be needed. This information is not intended to replace advice given to you by your health care provider. Make sure you discuss any questions you have with your health care provider. Document Revised: 10/26/2022 Document Reviewed: 12/09/2020 Elsevier Patient Education  2024 [employer].

## 2023-03-11 NOTE — Telephone Encounter (Signed)
Pt returning nurse's call. Please advise

## 2023-03-11 NOTE — Telephone Encounter (Signed)
Pt advised that he would rather have Cardiac CTA.   Please advise Lopressor dose for imaging.

## 2023-03-12 ENCOUNTER — Encounter (HOSPITAL_COMMUNITY): Payer: Self-pay

## 2023-03-12 MED ORDER — METOPROLOL TARTRATE 50 MG PO TABS: ORAL_TABLET | ORAL | 0 refills | Status: DC

## 2023-03-12 NOTE — Telephone Encounter (Signed)
Per provider, pt should take Lopressor 50 mg tablet 2 hours prior to CT Scan.   Letter sent to pt via MyChart as discussed with pt detailing procedure and instructions regarding CTA.

## 2023-03-15 ENCOUNTER — Telehealth: Payer: Self-pay

## 2023-03-15 NOTE — Telephone Encounter (Signed)
Transition Care Management Follow-up Telephone Call Date of discharge and from where: 03/01/2023 [facility] How have you been since you were released from the hospital? Patient stated he is feeling about the same, still having chest pain. Any questions or concerns? No  Items Reviewed: Did the pt receive and understand the discharge instructions provided? Yes  Medications obtained and verified?  No medication prescribed. Other? No  Any new allergies since your discharge? No  Dietary orders reviewed? Yes Do you have support at home? Yes   Follow up appointments reviewed:  PCP Hospital f/u appt confirmed? Yes  Scheduled to see Gabriel Earing, FNP on 03/10/2023 @ [facility]. Specialist Hospital f/u appt confirmed? Yes  Scheduled to see Marjo Bicker, MD on 03/09/2023 @ [facility]. Are transportation arrangements needed? No  If their condition worsens, is the pt aware to call PCP or go to the Emergency Dept.? Yes Was the patient provided with contact information for the PCP's office or ED? Yes Was to pt encouraged to call back with questions or concerns? Yes  Arvis Zwahlen [facility]  [facility] Population Health Community Resource Care Guide   [email or web address]  ?? [phone]   Website: [email or web address]  [email or web address].com

## 2023-03-17 ENCOUNTER — Encounter (HOSPITAL_COMMUNITY): Payer: Self-pay

## 2023-03-19 ENCOUNTER — Ambulatory Visit (HOSPITAL_COMMUNITY)
Admission: RE | Admit: 2023-03-19 | Discharge: 2023-03-19 | Disposition: A | Discharge status: Home / Self Care | Payer: Medicare Other | Source: Ambulatory Visit | Attending: Cardiovascular Disease | Admitting: Cardiovascular Disease

## 2023-03-19 ENCOUNTER — Other Ambulatory Visit: Payer: Self-pay | Admitting: Cardiovascular Disease

## 2023-03-19 ENCOUNTER — Other Ambulatory Visit (HOSPITAL_COMMUNITY): Payer: Medicare Other

## 2023-03-19 ENCOUNTER — Encounter (HOSPITAL_COMMUNITY): Payer: Medicare Other

## 2023-03-19 ENCOUNTER — Ambulatory Visit (HOSPITAL_COMMUNITY)
Admission: RE | Admit: 2023-03-19 | Discharge: 2023-03-19 | Disposition: A | Discharge status: Home / Self Care | Payer: Medicare Other | Source: Ambulatory Visit | Attending: Internal Medicine

## 2023-03-19 DIAGNOSIS — I2510 Atherosclerotic heart disease of native coronary artery without angina pectoris: Secondary | ICD-10-CM

## 2023-03-19 DIAGNOSIS — R931 Abnormal findings on diagnostic imaging of heart and coronary circulation: Secondary | ICD-10-CM | POA: Diagnosis not present

## 2023-03-19 DIAGNOSIS — R079 Chest pain, unspecified: Secondary | ICD-10-CM | POA: Diagnosis not present

## 2023-03-19 DIAGNOSIS — Q2112 Patent foramen ovale: Secondary | ICD-10-CM | POA: Diagnosis not present

## 2023-03-19 MED ORDER — IOHEXOL 350 MG/ML SOLN
100.0000 mL | Freq: Once | INTRAVENOUS | Status: AC | PRN
  Administered 2023-03-19: 100 mL via INTRAVENOUS

## 2023-03-19 MED ORDER — NITROGLYCERIN 0.4 MG SL SUBL
SUBLINGUAL_TABLET | SUBLINGUAL | Status: AC
  Filled 2023-03-19: qty 2

## 2023-03-19 MED ORDER — NITROGLYCERIN 0.4 MG SL SUBL
0.8000 mg | SUBLINGUAL_TABLET | Freq: Once | SUBLINGUAL | Status: AC
  Administered 2023-03-19: 0.8 mg via SUBLINGUAL

## 2023-03-19 NOTE — Progress Notes (Signed)
CT FFR ordered.   Tyler Spore T. Flora Lipps, MD, [facility]  [facility]  [address], [address] [place], [place] [address] [phone]  4:16 PM

## 2023-03-22 ENCOUNTER — Telehealth: Payer: Self-pay | Admitting: Internal Medicine

## 2023-03-22 NOTE — Telephone Encounter (Signed)
Patient notified that we are awaiting providers results

## 2023-03-22 NOTE — Telephone Encounter (Signed)
Patient is calling requesting a callback to discuss his CT results.   Please advise.

## 2023-03-23 ENCOUNTER — Other Ambulatory Visit (INDEPENDENT_AMBULATORY_CARE_PROVIDER_SITE_OTHER): Payer: Medicare Other

## 2023-03-23 ENCOUNTER — Encounter: Payer: Self-pay | Admitting: Orthopaedic Surgery

## 2023-03-23 ENCOUNTER — Ambulatory Visit: Payer: Medicare Other | Admitting: Orthopaedic Surgery

## 2023-03-23 ENCOUNTER — Ambulatory Visit: Payer: Medicare Other

## 2023-03-23 VITALS — BP 121/74 | HR 75 | Ht 70.0 in | Wt 218.0 lb

## 2023-03-23 DIAGNOSIS — M25552 Pain in left hip: Secondary | ICD-10-CM

## 2023-03-23 DIAGNOSIS — M79652 Pain in left thigh: Secondary | ICD-10-CM

## 2023-03-23 DIAGNOSIS — M79605 Pain in left leg: Secondary | ICD-10-CM

## 2023-03-23 MED ORDER — METOPROLOL TARTRATE 25 MG PO TABS: 25.0000 mg | ORAL_TABLET | Freq: Two times a day (BID) | ORAL | 3 refills | Status: DC

## 2023-03-23 MED ORDER — LISINOPRIL 10 MG PO TABS: 10.0000 mg | ORAL_TABLET | Freq: Every day | ORAL | 3 refills | Status: DC

## 2023-03-23 MED ORDER — HYDROCODONE-ACETAMINOPHEN 5-325 MG PO TABS
1.0000 | ORAL_TABLET | ORAL | 0 refills | Status: AC | PRN
Start: 2023-03-23 — End: 2023-03-28

## 2023-03-23 NOTE — Patient Instructions (Addendum)
MRI left thigh Call central scheduling [phone]

## 2023-03-23 NOTE — Telephone Encounter (Signed)
Pt.notified

## 2023-03-23 NOTE — Addendum Note (Signed)
Addended by: Michaele Offer on: 03/23/2023 10:32 AM   Modules accepted: Orders

## 2023-03-23 NOTE — Telephone Encounter (Signed)
-----   Message from Vishnu P Mallipeddi sent at 03/23/2023 12:27 PM EDT ----- Coronary calcium score is 154 (88 percentile for age and sex matched control), total plaque volume is severe.  Severe blockage in one of the branch vessels.  This can be managed with medical therapy, decrease lisinopril from 10 mg to 2.5 mg once daily and start metoprolol tartrate 25 mg twice daily for improvement in chest pains. Please schedule follow-up in 3 months.  Aortic root is 40 mm, will monitor with CT chest/aorta in 1 year.  He has a small PFO, no significance, no history of CVA.

## 2023-03-23 NOTE — Telephone Encounter (Signed)
-----   Message from Tyler Barton sent at 03/23/2023 12:27 PM EDT ----- Coronary calcium score is 154 (88 percentile for age and sex matched control), total plaque volume is severe.  Severe blockage in one of the branch vessels.  This can be managed with medical therapy, decrease lisinopril from 10 mg to 2.5 mg once daily and start metoprolol tartrate 25 mg twice daily for improvement in chest pains. Please schedule follow-up in 3 months.  Aortic root is 40 mm, will monitor with CT chest/aorta in 1 year.  He has a small PFO, no significance, no history of CVA.

## 2023-03-23 NOTE — Progress Notes (Signed)
Subjective:    Patient ID: Tyler Barton, male    DOB: 04-08-1971, 52 y.o.   MRN: [id number]  HPI He has had pain in the left quadriceps area mid thigh for over a month. He denies any injury.  He has been seen at [facility] for this. He has been on Robaxin which helps only slightly.  I have reviewed the notes.  He has pain when first standing or after walking a distance.  He has a decided limp.  He has tried heat, ice, rubs, Advil with no help.  He has GERD.  He is not getting any better.  His son is getting married at [employer] October 4.  He will have to go there October 5.   Review of Systems  Constitutional:  Positive for activity change.  Musculoskeletal:  Positive for arthralgias, gait problem and myalgias.  All other systems reviewed and are negative. For Review of Systems, all other systems reviewed and are negative.  The following is a summary of the past history medically, past history surgically, known current medicines, social history and family history.  This information is gathered electronically by the computer from prior information and documentation.  I review this each visit and have found including this information at this point in the chart is beneficial and informative.   Past Medical History:  Diagnosis Date   Anxiety    COVID-19 virus infection 04/2020   a. Ss onset 04/23/2020.   DDD (degenerative disc disease)    Morbid obesity (HCC)     Past Surgical History:  Procedure Laterality Date   KNEE ARTHROSCOPY Left 1993   LIPOMA EXCISION      Current Outpatient Medications on File Prior to Visit  Medication Sig Dispense Refill   buPROPion (WELLBUTRIN XL) 150 MG 24 hr tablet Take 1 tablet (150 mg total) by mouth daily. 90 tablet 1   celecoxib (CELEBREX) 200 MG capsule Take 1 capsule by mouth twice daily 60 capsule 5   diazepam (VALIUM) 5 MG tablet Take 1 tablet (5 mg total) by mouth every 12 (twelve) hours as needed. for anxiety 60 tablet 5    famotidine (PEPCID) 20 MG tablet Take 1 tablet (20 mg total) by mouth 2 (two) times daily. 180 tablet 1   fluticasone (FLONASE) 50 MCG/ACT nasal spray Place 2 sprays into both nostrils daily. 16 g 5   lisinopril (ZESTRIL) 10 MG tablet Take 1 tablet (10 mg total) by mouth daily. 90 tablet 1   loratadine (EQ ALLERGY RELIEF) 10 MG tablet Take 1 tablet (10 mg total) by mouth daily. 30 tablet 5   meclizine (ANTIVERT) 50 MG tablet Take 1 tablet (50 mg total) by mouth 3 (three) times daily as needed. 30 tablet 1   methocarbamol (ROBAXIN) 500 MG tablet Take 1 tablet (500 mg total) by mouth every 6 (six) hours as needed for muscle spasms. 60 tablet 0   metoprolol tartrate (LOPRESSOR) 50 MG tablet Take 1 tablet by mouth 2 hours prior to CT scan. 1 tablet 0   tadalafil (CIALIS) 20 MG tablet Take 1 tablet (20 mg total) by mouth as needed for erectile dysfunction. 45 tablet 3   No current facility-administered medications on file prior to visit.    Social History   Socioeconomic History   Marital status: Married    Spouse name: Boyd Kerbs   Number of children: 4   Years of education: 10   Highest education level: 10th grade  Occupational History   Occupation:  disabled    Comment: truck driver  Tobacco Use   Smoking status: Never   Smokeless tobacco: Never  Vaping Use   Vaping status: Never Used  Substance and Sexual Activity   Alcohol use: Yes    Comment: ocassionally - rare   Drug use: No   Sexual activity: Yes  Other Topics Concern   Not on file  Social History Narrative   Sherrod resides locally in [place] with his wife and step son. He has four children of his own and one was adopted because he had the child at the early age of 18. He has two step children. 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 He enjoys working in his shop and piddling with cars. He has recently been diagnosed with a hernia and this is keeping him from doing anything strenuous.    Social Determinants of  Health   Financial Resource Strain: Low Risk  (01/22/2023)   Overall Financial Resource Strain (CARDIA)    Difficulty of Paying Living Expenses: Not very hard  Food Insecurity: No Food Insecurity (01/22/2023)   Hunger Vital Sign    Worried About Running Out of Food in the Last Year: Never true    Ran Out of Food in the Last Year: Never true  Transportation Needs: No Transportation Needs (01/22/2023)   PRAPARE - Administrator, Civil Service (Medical): No    Lack of Transportation (Non-Medical): No  Physical Activity: Inactive (01/22/2023)   Exercise Vital Sign    Days of Exercise per Week: 0 days    Minutes of Exercise per Session: 0 min  Stress: No Stress Concern Present (01/22/2023)   [employer] of Occupational Health - Occupational Stress Questionnaire    Feeling of Stress : Not at all  Social Connections: Moderately Isolated (01/22/2023)   Social Connection and Isolation Panel [NHANES]    Frequency of Communication with Friends and Family: More than three times a week    Frequency of Social Gatherings with Friends and Family: More than three times a week    Attends Religious Services: Never    Database administrator or Organizations: No    Attends Banker Meetings: Never    Marital Status: Married  Catering manager Violence: Not At Risk (01/22/2023)   Humiliation, Afraid, Rape, and Kick questionnaire    Fear of Current or Ex-Partner: No    Emotionally Abused: No    Physically Abused: No    Sexually Abused: No    Family History  Problem Relation Age of Onset   Anxiety disorder Mother    Anxiety disorder Father    Heart disease Father 68       with stent placement and bypass   Anxiety disorder Sister    Cancer Maternal Uncle        throat   Alzheimer's disease Maternal Grandmother    Cancer Paternal Grandmother    Heart disease Maternal Uncle    Heart disease Maternal Uncle     BP 121/74   Pulse 75   Ht 5\' 10"  (1.778 m)   Wt 218 lb (98.9  kg)   BMI 31.28 kg/m   Body mass index is 31.28 kg/m.      Objective:   Physical Exam Vitals and nursing note reviewed. Exam conducted with a chaperone present.  Constitutional:      Appearance: He is well-developed.  HENT:     Head: Normocephalic and atraumatic.  Eyes:  Conjunctiva/sclera: Conjunctivae normal.     Pupils: Pupils are equal, round, and reactive to light.  Cardiovascular:     Rate and Rhythm: Normal rate and regular rhythm.  Pulmonary:     Effort: Pulmonary effort is normal.  Abdominal:     Palpations: Abdomen is soft.  Musculoskeletal:     Cervical back: Normal range of motion and neck supple.       Legs:  Skin:    General: Skin is warm and dry.  Neurological:     Mental Status: He is alert and oriented to person, place, and time.     Cranial Nerves: No cranial nerve deficit.     Motor: No abnormal muscle tone.     Coordination: Coordination normal.     Deep Tendon Reflexes: Reflexes are normal and symmetric. Reflexes normal.  Psychiatric:        Behavior: Behavior normal.        Thought Content: Thought content normal.        Judgment: Judgment normal.   X-rays were done of his left hip and femur, reported separately.  Negative.        Assessment & Plan:   Encounter Diagnoses  Name Primary?   Pain in left leg Yes   Pain in left hip    Left thigh pain    I will get MRI of the left thigh to rule out partial tear of quads in mid thigh.  I will give pain medicine to have just in case.  I have reviewed the [place] Controlled Substance Reporting System web site prior to prescribing narcotic medicine for this patient.  Return in three weeks.  [place] cane or crutch on beach [place].  Call if any problem.  Precautions discussed.  Electronically Signed Darreld Mclean, MD 9/17/202410:21 AM

## 2023-03-23 NOTE — Progress Notes (Signed)
Mri

## 2023-03-28 ENCOUNTER — Ambulatory Visit (HOSPITAL_COMMUNITY)
Admission: RE | Admit: 2023-03-28 | Discharge: 2023-03-28 | Disposition: A | Discharge status: Home / Self Care | Payer: Medicare Other | Source: Ambulatory Visit | Attending: Orthopaedic Surgery | Admitting: Orthopaedic Surgery

## 2023-03-28 DIAGNOSIS — M79605 Pain in left leg: Secondary | ICD-10-CM | POA: Insufficient documentation

## 2023-03-28 DIAGNOSIS — M129 Arthropathy, unspecified: Secondary | ICD-10-CM | POA: Diagnosis not present

## 2023-03-28 DIAGNOSIS — M25462 Effusion, left knee: Secondary | ICD-10-CM | POA: Diagnosis not present

## 2023-03-28 DIAGNOSIS — M7062 Trochanteric bursitis, left hip: Secondary | ICD-10-CM | POA: Diagnosis not present

## 2023-04-07 ENCOUNTER — Encounter: Payer: Self-pay | Admitting: Orthopaedic Surgery

## 2023-04-07 ENCOUNTER — Other Ambulatory Visit (INDEPENDENT_AMBULATORY_CARE_PROVIDER_SITE_OTHER): Payer: Medicare Other

## 2023-04-07 ENCOUNTER — Other Ambulatory Visit: Payer: Self-pay | Admitting: Orthopaedic Surgery

## 2023-04-07 ENCOUNTER — Ambulatory Visit: Payer: Medicare Other | Admitting: Orthopaedic Surgery

## 2023-04-07 VITALS — BP 109/73 | HR 58 | Ht 70.0 in | Wt 215.0 lb

## 2023-04-07 DIAGNOSIS — G8929 Other chronic pain: Secondary | ICD-10-CM

## 2023-04-07 DIAGNOSIS — M5441 Lumbago with sciatica, right side: Secondary | ICD-10-CM | POA: Diagnosis not present

## 2023-04-07 DIAGNOSIS — M79605 Pain in left leg: Secondary | ICD-10-CM | POA: Diagnosis not present

## 2023-04-07 DIAGNOSIS — M5442 Lumbago with sciatica, left side: Secondary | ICD-10-CM

## 2023-04-07 MED ORDER — METHOCARBAMOL 500 MG PO TABS
500.0000 mg | ORAL_TABLET | Freq: Four times a day (QID) | ORAL | 0 refills | Status: DC | PRN
Start: 2023-04-07 — End: 2023-04-21

## 2023-04-07 MED ORDER — PREDNISONE 5 MG (21) PO TBPK: ORAL_TABLET | ORAL | 0 refills | Status: DC

## 2023-04-07 NOTE — Patient Instructions (Signed)
Central scheduling [phone] 

## 2023-04-07 NOTE — Progress Notes (Signed)
I am worse.  He had the MRI of the left femur showing: IMPRESSION: 1. Moderate left knee joint effusion with tricompartmental marginal spurring in the left knee. Low signal intensity osteochondral lesion posteriorly along the lateral femoral condyle. 2. Small left Baker's cyst. 3. Mild proximal hamstring tendinopathy bilaterally without overt tear. 4. Mild degenerative arthropathy in the left hip. 5. Contralateral (right sided) trochanteric bursitis. 6. No specific quadriceps abnormality observed.   I have gone over the results with him.  He has more pain.  He has pain in the anterior mid quads area.  He has no new trauma.  I am concerned the pain may be coming from the lumbar spine and affecting the anterior thigh now that we know there is no tear of the quads deep.  I will get MRI of the lumbar spine.  X-rays were done today of the lumbar spine, reported separately.  He has lower back pain, ROM is good but tender, he has tightness of the lumbar area but no spasm, muscle tone and strength is normal and NV intact.   His left thigh has no defect or pain today.  Encounter Diagnoses  Name Primary?   Chronic bilateral low back pain with bilateral sciatica Yes   Left leg pain    I will get MRI of the lumbar spine.  I will give prednisone dose pack and renew the Robaxin.  Return in three weeks.  Call if any problem.  Precautions discussed.  Electronically Signed Darreld Mclean, MD 10/2/20248:43 AM

## 2023-04-13 ENCOUNTER — Ambulatory Visit: Payer: Medicare Other | Admitting: Orthopaedic Surgery

## 2023-04-13 ENCOUNTER — Ambulatory Visit (HOSPITAL_COMMUNITY)
Admission: RE | Admit: 2023-04-13 | Discharge: 2023-04-13 | Disposition: A | Discharge status: Home / Self Care | Payer: Medicare Other | Source: Ambulatory Visit | Attending: Orthopaedic Surgery | Admitting: Orthopaedic Surgery

## 2023-04-13 DIAGNOSIS — M5442 Lumbago with sciatica, left side: Secondary | ICD-10-CM | POA: Diagnosis not present

## 2023-04-13 DIAGNOSIS — M5137 Other intervertebral disc degeneration, lumbosacral region with discogenic back pain only: Secondary | ICD-10-CM | POA: Diagnosis not present

## 2023-04-13 DIAGNOSIS — G8929 Other chronic pain: Secondary | ICD-10-CM | POA: Insufficient documentation

## 2023-04-13 DIAGNOSIS — M5126 Other intervertebral disc displacement, lumbar region: Secondary | ICD-10-CM | POA: Diagnosis not present

## 2023-04-13 DIAGNOSIS — M47816 Spondylosis without myelopathy or radiculopathy, lumbar region: Secondary | ICD-10-CM | POA: Diagnosis not present

## 2023-04-13 DIAGNOSIS — M5441 Lumbago with sciatica, right side: Secondary | ICD-10-CM | POA: Insufficient documentation

## 2023-04-13 DIAGNOSIS — M4807 Spinal stenosis, lumbosacral region: Secondary | ICD-10-CM | POA: Diagnosis not present

## 2023-04-21 ENCOUNTER — Telehealth: Payer: Self-pay | Admitting: Radiology

## 2023-04-21 ENCOUNTER — Ambulatory Visit: Payer: Medicare Other | Admitting: Orthopaedic Surgery

## 2023-04-21 ENCOUNTER — Encounter: Payer: Self-pay | Admitting: Orthopaedic Surgery

## 2023-04-21 DIAGNOSIS — M47896 Other spondylosis, lumbar region: Secondary | ICD-10-CM

## 2023-04-21 DIAGNOSIS — M79605 Pain in left leg: Secondary | ICD-10-CM

## 2023-04-21 DIAGNOSIS — M5116 Intervertebral disc disorders with radiculopathy, lumbar region: Secondary | ICD-10-CM

## 2023-04-21 DIAGNOSIS — G8929 Other chronic pain: Secondary | ICD-10-CM

## 2023-04-21 MED ORDER — METHOCARBAMOL 500 MG PO TABS
500.0000 mg | ORAL_TABLET | Freq: Four times a day (QID) | ORAL | 0 refills | Status: DC | PRN
Start: 2023-04-21 — End: 2023-07-20

## 2023-04-21 NOTE — Progress Notes (Signed)
My thigh is hurting still and now my right heel is numb.  He had MRI of the lumbar spine showing: IMPRESSION: 1. Multilevel lumbar spondylosis, worst at L4-5, where a central disc protrusion results in compression of the right-greater-than-left traversing L5 nerve roots in the subarticular zones. 2. Disc bulge with superimposed right central disc extrusion with caudal migration at L3-4 displaces the traversing right L4 nerve root in the subarticular zone. 3. Left eccentric disc bulge at L5-S1 displaces the traversing left S1 nerve root in the subarticular zone. 4. Moderate bilateral neural foraminal narrowing at L4-5 and moderate left neural foraminal narrowing at L5-S1.  I have explained the findings to him. I will have him see a surgeon for further evaluation.  He prefers a Careers adviser whose name and practice he cannot remember today.  He says his wife has the information and I will refer him there.  He is to call me later.  Lower back is tender, muscle tone and strength normal, ROM is good, NV intact.  He has decreased sensation of the right foot, heel area.  Left thigh is tender anterior mid thigh.  Encounter Diagnoses  Name Primary?   Left leg pain    Lumbar pain with radiation down both legs Yes   To call us and let us know where he would like to be referred for further evaluation.  I have refilled his Robaxin.  Call if any problem.  Precautions discussed.  Electronically Signed Darreld Mclean, MD 10/16/20249:17 AM

## 2023-04-21 NOTE — Addendum Note (Signed)
Addended by: Michaele Offer on: 04/21/2023 11:34 AM   Modules accepted: Orders

## 2023-04-21 NOTE — Telephone Encounter (Signed)
Tyler Barton patient called wants to be called back. About a referral

## 2023-04-26 DIAGNOSIS — M47896 Other spondylosis, lumbar region: Secondary | ICD-10-CM | POA: Diagnosis not present

## 2023-04-26 DIAGNOSIS — M5451 Vertebrogenic low back pain: Secondary | ICD-10-CM | POA: Diagnosis not present

## 2023-05-05 ENCOUNTER — Telehealth: Payer: Self-pay | Admitting: Orthopedic Surgery

## 2023-05-05 NOTE — Telephone Encounter (Signed)
Patient called and said that Dr. Christell Constant has sent for him to have PT in [place] but he never got any call or any appointment for it. CB#[phone]

## 2023-05-07 DIAGNOSIS — M5489 Other dorsalgia: Secondary | ICD-10-CM | POA: Diagnosis not present

## 2023-05-07 DIAGNOSIS — M545 Low back pain, unspecified: Secondary | ICD-10-CM | POA: Diagnosis not present

## 2023-05-11 DIAGNOSIS — R202 Paresthesia of skin: Secondary | ICD-10-CM | POA: Diagnosis not present

## 2023-05-19 ENCOUNTER — Ambulatory Visit: Payer: Medicare Other | Admitting: Orthopedic Surgery

## 2023-07-05 ENCOUNTER — Encounter: Payer: Self-pay | Admitting: Family Medicine

## 2023-07-05 ENCOUNTER — Ambulatory Visit: Payer: Medicare Other | Admitting: Family Medicine

## 2023-07-05 VITALS — BP 103/69 | HR 58 | Temp 98.3°F | Ht 70.0 in | Wt 222.0 lb

## 2023-07-05 DIAGNOSIS — L239 Allergic contact dermatitis, unspecified cause: Secondary | ICD-10-CM | POA: Diagnosis not present

## 2023-07-05 NOTE — Progress Notes (Signed)
BP 103/69   Pulse (!) 58   Temp 98.3 F (36.8 C)   Ht 5\' 10"  (1.778 m)   Wt 222 lb (100.7 kg)   SpO2 95%   BMI 31.85 kg/m    Subjective:   Patient ID: Tyler Barton, male    DOB: 1970/07/17, 52 y.o.   MRN: [id number]  HPI: Tyler Barton is a 52 y.o. male presenting on 07/05/2023 for Rash   HPI Rash Patient had a rash on his upper torso that started the night is has a few bumps on his right arm and his left arm.  He said they started looking like bumps that were almost blisters and now it is more small red bumps on his upper chest and some on his arms.  It is not very pruritic.  He has never had this before.  He did try a new hoodie that had some rougher fabric and does not know if that caused it.  He denies any major new medicines that he knows of.  He says this started about 4 days ago.  Did use some cortisone cream on it and has helped calm it down but not getting rid of it.  He says he took a hot shower 2 days ago and it made it a lot worse.  He has a little bit of irritation in the back of his throat but denies any swelling or difficulty breathing and he does fight some allergy drainage all the time.  Relevant past medical, surgical, family and social history reviewed and updated as indicated. Interim medical history since our last visit reviewed. Allergies and medications reviewed and updated.  Review of Systems  Constitutional:  Negative for chills and fever.  Eyes:  Negative for discharge.  Respiratory:  Negative for shortness of breath and wheezing.   Cardiovascular:  Negative for chest pain and leg swelling.  Musculoskeletal:  Negative for back pain and gait problem.  Skin:  Positive for rash. Negative for color change and wound.  All other systems reviewed and are negative.   Per HPI unless specifically indicated above   Allergies as of 07/05/2023   No Known Allergies      Medication List        Accurate as of July 05, 2023  9:11 AM. If you have any  questions, ask your nurse or doctor.          buPROPion 150 MG 24 hr tablet Commonly known as: WELLBUTRIN XL Take 1 tablet (150 mg total) by mouth daily.   celecoxib 200 MG capsule Commonly known as: CELEBREX Take 1 capsule by mouth twice daily   diazepam 5 MG tablet Commonly known as: VALIUM Take 1 tablet (5 mg total) by mouth every 12 (twelve) hours as needed. for anxiety   famotidine 20 MG tablet Commonly known as: PEPCID Take 1 tablet (20 mg total) by mouth 2 (two) times daily.   fluticasone 50 MCG/ACT nasal spray Commonly known as: FLONASE Place 2 sprays into both nostrils daily.   lisinopril 10 MG tablet Commonly known as: ZESTRIL Take 1 tablet (10 mg total) by mouth daily.   loratadine 10 MG tablet Commonly known as: EQ Allergy Relief Take 1 tablet (10 mg total) by mouth daily.   meclizine 50 MG tablet Commonly known as: ANTIVERT Take 1 tablet (50 mg total) by mouth 3 (three) times daily as needed.   methocarbamol 500 MG tablet Commonly known as: ROBAXIN Take 1 tablet (500 mg total) by mouth  every 6 (six) hours as needed for muscle spasms.   metoprolol tartrate 25 MG tablet Commonly known as: LOPRESSOR Take 1 tablet (25 mg total) by mouth 2 (two) times daily.   predniSONE 5 MG (21) Tbpk tablet Commonly known as: STERAPRED UNI-PAK 21 TAB Take 6 pills first day; 5 pills second day; 4 pills third day; 3 pills fourth day; 2 pills next day and 1 pill last day.   tadalafil 20 MG tablet Commonly known as: CIALIS Take 1 tablet (20 mg total) by mouth as needed for erectile dysfunction.         Objective:   BP 103/69   Pulse (!) 58   Temp 98.3 F (36.8 C)   Ht 5\' 10"  (1.778 m)   Wt 222 lb (100.7 kg)   SpO2 95%   BMI 31.85 kg/m   Wt Readings from Last 3 Encounters:  07/05/23 222 lb (100.7 kg)  04/07/23 215 lb (97.5 kg)  03/23/23 218 lb (98.9 kg)    Physical Exam Vitals and nursing note reviewed.  Constitutional:      General: He is not in  acute distress.    Appearance: He is well-developed. He is not diaphoretic.  Eyes:     General: No scleral icterus.       Right eye: No discharge.     Conjunctiva/sclera: Conjunctivae normal.     Pupils: Pupils are equal, round, and reactive to light.  Neck:     Thyroid: No thyromegaly.  Cardiovascular:     Rate and Rhythm: Normal rate and regular rhythm.     Heart sounds: Normal heart sounds. No murmur heard. Pulmonary:     Effort: Pulmonary effort is normal. No respiratory distress.     Breath sounds: Normal breath sounds. No wheezing.  Musculoskeletal:        General: Normal range of motion.     Cervical back: Neck supple.  Lymphadenopathy:     Cervical: No cervical adenopathy.  Skin:    General: Skin is warm and dry.     Findings: Rash (Scattered fine papules on upper torso anteriorly.  Few small papules on right forearm and the back of the left hand.) present.  Neurological:     Mental Status: He is alert and oriented to person, place, and time.     Coordination: Coordination normal.  Psychiatric:        Behavior: Behavior normal.       Assessment & Plan:   Problem List Items Addressed This Visit   None Visit Diagnoses       Allergic contact dermatitis, unspecified trigger    -  Primary       Likely a contact dermatitis versus atopic dermatitis, recommended that he do Benadryl twice a day for the next few days and continue with cortisone cream and then if anything worsens or does not improve to give [place] call back.  Avoid hot showers Follow up plan: Return if symptoms worsen or fail to improve.  Counseling provided for all of the vaccine components No orders of the defined types were placed in this encounter.   Arville Care, MD [facility] 07/05/2023, 9:11 AM

## 2023-07-20 ENCOUNTER — Ambulatory Visit (INDEPENDENT_AMBULATORY_CARE_PROVIDER_SITE_OTHER): Payer: Medicare Other | Admitting: Nurse Practitioner

## 2023-07-20 VITALS — BP 118/86 | HR 58 | Temp 96.8°F | Ht 70.0 in | Wt 221.0 lb

## 2023-07-20 DIAGNOSIS — M25552 Pain in left hip: Secondary | ICD-10-CM | POA: Diagnosis not present

## 2023-07-20 DIAGNOSIS — F3342 Major depressive disorder, recurrent, in full remission: Secondary | ICD-10-CM | POA: Diagnosis not present

## 2023-07-20 DIAGNOSIS — I10 Essential (primary) hypertension: Secondary | ICD-10-CM | POA: Diagnosis not present

## 2023-07-20 DIAGNOSIS — F419 Anxiety disorder, unspecified: Secondary | ICD-10-CM | POA: Diagnosis not present

## 2023-07-20 DIAGNOSIS — M25551 Pain in right hip: Secondary | ICD-10-CM

## 2023-07-20 DIAGNOSIS — F410 Panic disorder [episodic paroxysmal anxiety] without agoraphobia: Secondary | ICD-10-CM

## 2023-07-20 DIAGNOSIS — K582 Mixed irritable bowel syndrome: Secondary | ICD-10-CM

## 2023-07-20 DIAGNOSIS — L299 Pruritus, unspecified: Secondary | ICD-10-CM

## 2023-07-20 DIAGNOSIS — E782 Mixed hyperlipidemia: Secondary | ICD-10-CM

## 2023-07-20 DIAGNOSIS — N5201 Erectile dysfunction due to arterial insufficiency: Secondary | ICD-10-CM

## 2023-07-20 LAB — LIPID PANEL

## 2023-07-20 MED ORDER — TADALAFIL 20 MG PO TABS: 20.0000 mg | ORAL_TABLET | ORAL | 3 refills | Status: DC | PRN

## 2023-07-20 MED ORDER — FAMOTIDINE 20 MG PO TABS: 20.0000 mg | ORAL_TABLET | Freq: Two times a day (BID) | ORAL | 1 refills | Status: DC

## 2023-07-20 MED ORDER — CELECOXIB 200 MG PO CAPS: 200.0000 mg | ORAL_CAPSULE | Freq: Two times a day (BID) | ORAL | 5 refills | Status: DC

## 2023-07-20 MED ORDER — DIAZEPAM 5 MG PO TABS: 5.0000 mg | ORAL_TABLET | Freq: Two times a day (BID) | ORAL | 5 refills | Status: DC | PRN

## 2023-07-20 MED ORDER — LISINOPRIL 10 MG PO TABS: 10.0000 mg | ORAL_TABLET | Freq: Every day | ORAL | 3 refills | Status: DC

## 2023-07-20 MED ORDER — PREDNISONE 20 MG PO TABS: 40.0000 mg | ORAL_TABLET | Freq: Every day | ORAL | 0 refills | Status: AC

## 2023-07-20 MED ORDER — METHOCARBAMOL 500 MG PO TABS: 500.0000 mg | ORAL_TABLET | Freq: Four times a day (QID) | ORAL | 0 refills | Status: AC | PRN

## 2023-07-20 MED ORDER — BUPROPION HCL ER (XL) 150 MG PO TB24: 150.0000 mg | ORAL_TABLET | Freq: Every day | ORAL | 1 refills | Status: DC

## 2023-07-20 NOTE — Progress Notes (Signed)
 Subjective:    Patient ID: Tyler Barton, male    DOB: 1971/05/31, 53 y.o.   MRN: [id number]   Chief Complaint: medical management of chronic issues     HPI:  Tyler Barton is a 53 y.o. who identifies as a male who was assigned male at birth.   Social history: Lives with: wife Work history: disability for anxiety   Comes in today for follow up of the following chronic medical issues:  1. Primary hypertension No c/o chest pain, sob or headache. He stopped taking lisinopril  cause he says he was just feeling bad. Since then his blood pressure has been running high. BP Readings from Last 3 Encounters:  07/05/23 103/69  04/07/23 109/73  03/23/23 121/74      2. Mixed hyperlipidmia Does not watch diet and does no dedicated exercise. Refuses statin therapy Lab Results  Component Value Date   CHOL 226 (H) 01/15/2023   HDL 40 01/15/2023   LDLCALC 152 (H) 01/15/2023   TRIG 186 (H) 01/15/2023   CHOLHDL 5.7 (H) 01/15/2023   The 10-year ASCVD risk score (Arnett DK, et al., 2019) is: 6.8%   3. Recurrent major depressive disorder, in full remission (HCC) 4. Anxiety 5. Panic disorder Patient  is a very anxious person, he worries about everything. He cannot let things from the past go. When he gets something on his mind he can't let it go.    07/20/2023    8:15 AM 07/05/2023    8:54 AM 01/21/2023   12:21 PM 01/15/2023    8:07 AM  GAD 7 : Generalized Anxiety Score  Nervous, Anxious, on Edge 1 2 1  0  Control/stop worrying 1 1 1 1   Worry too much - different things 1 1 1 1   Trouble relaxing 1 1 0 1  Restless 0 1 0 0  Easily annoyed or irritable 0 0 0 0  Afraid - awful might happen 0 0 0 0  Total GAD 7 Score 4 6 3 3   Anxiety Difficulty Somewhat difficult Somewhat difficult Somewhat difficult Somewhat difficult       07/20/2023    8:15 AM 01/22/2023    1:33 PM 01/21/2023   12:20 PM  Depression screen PHQ 2/9  Decreased Interest 1 0 0  Down, Depressed, Hopeless 0 0 0  PHQ  - 2 Score 1 0 0  Altered sleeping 0 0 0  Tired, decreased energy 1 0 1  Change in appetite 1 0 1  Feeling bad or failure about yourself  0 0 0  Trouble concentrating 0 0 0  Moving slowly or fidgety/restless 0 0 0  Suicidal thoughts 0 0 0  PHQ-9 Score 3 0 2  Difficult doing work/chores Somewhat difficult Not difficult at all Somewhat difficult    6. Irritable bowel syndrome with both constipation and diarrhea Has alternating constipation and diarrhea. Due to his anxiety.  7. Morbid obesity Weight is up 3 lbs Wt Readings from Last 3 Encounters:  07/05/23 222 lb (100.7 kg)  04/07/23 215 lb (97.5 kg)  03/23/23 218 lb (98.9 kg)   BMI Readings from Last 3 Encounters:  07/05/23 31.85 kg/m  04/07/23 30.85 kg/m  03/23/23 31.28 kg/m      New complaints: Very itchy rash on upper chest for several weeks. He has taken benadryl  but that knocks him out.   No Known Allergies Outpatient Encounter Medications as of 07/20/2023  Medication Sig   buPROPion  (WELLBUTRIN  XL) 150 MG 24 hr tablet Take 1  tablet (150 mg total) by mouth daily.   celecoxib  (CELEBREX ) 200 MG capsule Take 1 capsule by mouth twice daily   diazepam  (VALIUM ) 5 MG tablet Take 1 tablet (5 mg total) by mouth every 12 (twelve) hours as needed. for anxiety   famotidine  (PEPCID ) 20 MG tablet Take 1 tablet (20 mg total) by mouth 2 (two) times daily.   fluticasone  (FLONASE ) 50 MCG/ACT nasal spray Place 2 sprays into both nostrils daily.   lisinopril  (ZESTRIL ) 10 MG tablet Take 1 tablet (10 mg total) by mouth daily.   loratadine  (EQ ALLERGY RELIEF) 10 MG tablet Take 1 tablet (10 mg total) by mouth daily.   meclizine  (ANTIVERT ) 50 MG tablet Take 1 tablet (50 mg total) by mouth 3 (three) times daily as needed.   methocarbamol  (ROBAXIN ) 500 MG tablet Take 1 tablet (500 mg total) by mouth every 6 (six) hours as needed for muscle spasms.   metoprolol  tartrate (LOPRESSOR ) 25 MG tablet Take 1 tablet (25 mg total) by mouth 2 (two) times  daily.   predniSONE  (STERAPRED UNI-PAK 21 TAB) 5 MG (21) TBPK tablet Take 6 pills first day; 5 pills second day; 4 pills third day; 3 pills fourth day; 2 pills next day and 1 pill last day.   tadalafil  (CIALIS ) 20 MG tablet Take 1 tablet (20 mg total) by mouth as needed for erectile dysfunction.   No facility-administered encounter medications on file as of 07/20/2023.    Past Surgical History:  Procedure Laterality Date   KNEE ARTHROSCOPY Left 1993   LIPOMA EXCISION      Family History  Problem Relation Age of Onset   Anxiety disorder Mother    Anxiety disorder Father    Heart disease Father 12       with stent placement and bypass   Anxiety disorder Sister    Cancer Maternal Uncle        throat   Alzheimer's disease Maternal Grandmother    Cancer Paternal Grandmother    Heart disease Maternal Uncle    Heart disease Maternal Uncle       Controlled substance contract: n/a     Review of Systems  Constitutional:  Negative for diaphoresis.  Eyes:  Negative for pain.  Respiratory:  Negative for shortness of breath.   Cardiovascular:  Negative for chest pain, palpitations and leg swelling.  Gastrointestinal:  Negative for abdominal pain.  Endocrine: Negative for polydipsia.  Skin:  Negative for rash.  Neurological:  Negative for dizziness, weakness and headaches.  Hematological:  Does not bruise/bleed easily.  All other systems reviewed and are negative.      Objective:   Physical Exam Vitals and nursing note reviewed.  Constitutional:      Appearance: Normal appearance. He is well-developed.  HENT:     Head: Normocephalic.     Nose: Nose normal.     Mouth/Throat:     Mouth: Mucous membranes are moist.     Pharynx: Oropharynx is clear.  Eyes:     Pupils: Pupils are equal, round, and reactive to light.  Neck:     Thyroid : No thyroid  mass or thyromegaly.     Vascular: No carotid bruit or JVD.     Trachea: Phonation normal.  Cardiovascular:     Rate and  Rhythm: Normal rate and regular rhythm.  Pulmonary:     Effort: Pulmonary effort is normal. No respiratory distress.     Breath sounds: Normal breath sounds.  Abdominal:     General: Bowel  sounds are normal.     Palpations: Abdomen is soft.     Tenderness: There is no abdominal tenderness.  Musculoskeletal:        General: Normal range of motion.     Cervical back: Normal range of motion and neck supple.  Lymphadenopathy:     Cervical: No cervical adenopathy.  Skin:    General: Skin is warm and dry.  Neurological:     Mental Status: He is alert and oriented to person, place, and time.  Psychiatric:        Behavior: Behavior normal.        Thought Content: Thought content normal.        Judgment: Judgment normal.     BP 118/86   Pulse (!) 58   Temp (!) 96.8 F (36 C) (Temporal)   Ht 5' 10 (1.778 m)   Wt 221 lb (100.2 kg)   SpO2 94%   BMI 31.71 kg/m         Assessment & Plan:   Tyler Barton comes in today with chief complaint of medical management of chronic issues    Diagnosis and orders addressed:  1. Primary hypertension Low sodium diet Back on lisinopril  - lisinopril  (ZESTRIL ) 10 MG tablet; Take 1 tablet (10 mg total) by mouth daily.  Dispense: 90 tablet; Refill: 1 - CBC with Differential/Platelet - CMP14+EGFR - Lipid panel  2. Recurrent major depressive disorder, in full remission (HCC) Stress management - buPROPion  (WELLBUTRIN  XL) 150 MG 24 hr tablet; Take 1 tablet (150 mg total) by mouth daily.  Dispense: 90 tablet; Refill: 1  3. Anxiety - diazepam  (VALIUM ) 5 MG tablet; Take 1 tablet (5 mg total) by mouth every 12 (twelve) hours as needed. for anxiety  Dispense: 60 tablet; Refill: 5  4. Panic disorder Deep breathing exercises - ToxASSURE Select 13 (MW), Urine - buPROPion  (WELLBUTRIN  XL) 150 MG 24 hr tablet; Take 1 tablet (150 mg total) by mouth daily.  Dispense: 90 tablet; Refill: 1  5. Irritable bowel syndrome with both constipation and  diarrhea Watch diet to help with flare ups - famotidine  (PEPCID ) 20 MG tablet; Take 1 tablet (20 mg total) by mouth 2 (two) times daily.  Dispense: 180 tablet; Refill: 1  6. Morbid obesity (HCC) Discussed diet and exercise for person with BMI >25 Will recheck weight in 3-6 months   7. Erectile dysfunction due to arterial insufficiency - tadalafil  (CIALIS ) 20 MG tablet; Take 1 tablet (20 mg total) by mouth as needed for erectile dysfunction.  Dispense: 45 tablet; Refill: 3  8. Pruritus Prednisone  20mg  2 po daily for 5 day Avoid scratching Aveeno body wash Cerave cream bid  Labs pending Health Maintenance reviewed Diet and exercise encouraged  Follow up plan: 6 months   Mary-Margaret Gladis, FNP

## 2023-07-20 NOTE — Patient Instructions (Signed)
 Pruritus Pruritus is an itchy feeling on the skin. One of the most common causes is dry skin, but many different things can cause itching. Most cases of itching do not require medical attention. Sometimes itchy skin can turn into a rash or a secondary infection. Follow these instructions at home: Skin care  Do not use scented soaps, detergents, perfumes, and cosmetic products. Instead, use gentle, unscented versions of these items. Apply moisturizing creams to your skin frequently, at least twice daily. Apply immediately after bathing while skin is still wet. Take medicines or apply medicated creams only as told by your health care provider. This may include: Corticosteroid cream or topical calcineurin inhibitor. Anti-itch lotions containing urea, camphor, or menthol. Oral antihistamines. Do not take hot showers or baths, which can make itching worse. A short, cool shower may help with itching as long as you apply moisturizing lotion after the shower. Apply a cool, wet cloth (cool compress) to the affected areas. You may take lukewarm baths with one of the following: Epsom salts. You can get these at your local pharmacy or grocery store. Follow the instructions on the packaging. Baking soda. Pour a small amount into the bath as told by your health care provider. Colloidal oatmeal. You can get this at your local pharmacy or grocery store. Follow the instructions on the packaging. Do not scratch your skin. General instructions Avoid wearing tight clothes. Keep a journal to help find out what is causing your itching. Write down: What you eat and drink. What cosmetic products you use. What soaps or detergents you use. What you wear, including jewelry. Use a humidifier. This keeps the air moist, which helps to prevent dry skin. Be aware of any changes in your itchiness. Tell your health care provider about any changes. Contact a health care provider if: The itching does not go away after  several days. You notice redness, warmth, or drainage on the skin where you have scratched. You are unusually thirsty or urinating more than normal. Your skin tingles or feels numb. Your skin or the white parts of your eyes turn yellow (jaundice). You feel weak. You have any of the following: Night sweats. Tiredness (fatigue). Weight loss. Abdominal pain. Summary Pruritus is an itchy feeling on the skin. One of the most common causes is dry skin, but many different conditions and factors can cause itching. Apply moisturizing creams to your skin frequently, at least twice daily. Apply immediately after bathing while skin is still wet. Take medicines or apply medicated creams only as told by your health care provider. Do not take hot showers or baths. Do not use scented soaps, detergents, perfumes, or cosmetic products. Keep a journal to help find out what is causing your itching. This information is not intended to replace advice given to you by your health care provider. Make sure you discuss any questions you have with your health care provider. Document Revised: 07/30/2021 Document Reviewed: 07/30/2021 Elsevier Patient Education  2024 [employer].

## 2023-07-21 LAB — CBC WITH DIFFERENTIAL/PLATELET
Basophils Absolute: 0 10*3/uL (ref 0.0–0.2)
Basos: 1 %
EOS (ABSOLUTE): 0.4 10*3/uL (ref 0.0–0.4)
Eos: 8 %
Hematocrit: 43.3 % (ref 37.5–51.0)
Hemoglobin: 14.2 g/dL (ref 13.0–17.7)
Immature Grans (Abs): 0 10*3/uL (ref 0.0–0.1)
Immature Granulocytes: 0 %
Lymphocytes Absolute: 1.6 10*3/uL (ref 0.7–3.1)
Lymphs: 29 %
MCH: 29.8 pg (ref 26.6–33.0)
MCHC: 32.8 g/dL (ref 31.5–35.7)
MCV: 91 fL (ref 79–97)
Monocytes Absolute: 0.6 10*3/uL (ref 0.1–0.9)
Monocytes: 12 %
Neutrophils Absolute: 2.7 10*3/uL (ref 1.4–7.0)
Neutrophils: 50 %
Platelets: 333 10*3/uL (ref 150–450)
RBC: 4.76 x10E6/uL (ref 4.14–5.80)
RDW: 13.2 % (ref 11.6–15.4)
WBC: 5.4 10*3/uL (ref 3.4–10.8)

## 2023-07-21 LAB — CMP14+EGFR
ALT: 22 IU/L (ref 0–44)
AST: 25 IU/L (ref 0–40)
Albumin: 4.4 g/dL (ref 3.8–4.9)
Alkaline Phosphatase: 40 IU/L — ABNORMAL LOW (ref 44–121)
BUN/Creatinine Ratio: 10 (ref 9–20)
BUN: 11 mg/dL (ref 6–24)
Bilirubin Total: 1 mg/dL (ref 0.0–1.2)
CO2: 23 mmol/L (ref 20–29)
Calcium: 9.3 mg/dL (ref 8.7–10.2)
Chloride: 105 mmol/L (ref 96–106)
Creatinine, Ser: 1.13 mg/dL (ref 0.76–1.27)
Globulin, Total: 1.8 g/dL (ref 1.5–4.5)
Glucose: 97 mg/dL (ref 70–99)
Potassium: 4.4 mmol/L (ref 3.5–5.2)
Sodium: 143 mmol/L (ref 134–144)
Total Protein: 6.2 g/dL (ref 6.0–8.5)
eGFR: 78 mL/min/{1.73_m2} (ref >59)

## 2023-07-21 LAB — LIPID PANEL
Cholesterol, Total: 214 mg/dL — ABNORMAL HIGH (ref 100–199)
HDL: 39 mg/dL — ABNORMAL LOW (ref >39)
LDL CALC COMMENT:: 5.5 ratio — ABNORMAL HIGH (ref 0.0–5.0)
LDL Chol Calc (NIH): 154 mg/dL — ABNORMAL HIGH (ref 0–99)
Triglycerides: 118 mg/dL (ref 0–149)
VLDL Cholesterol Cal: 21 mg/dL (ref 5–40)

## 2023-08-01 DIAGNOSIS — U071 COVID-19: Secondary | ICD-10-CM | POA: Diagnosis not present

## 2024-01-17 ENCOUNTER — Encounter: Payer: Self-pay | Admitting: Nurse Practitioner

## 2024-01-17 ENCOUNTER — Ambulatory Visit (INDEPENDENT_AMBULATORY_CARE_PROVIDER_SITE_OTHER): Payer: Medicare Other | Admitting: Nurse Practitioner

## 2024-01-17 VITALS — BP 136/76 | HR 51 | Temp 97.2°F | Ht 70.0 in | Wt 215.0 lb

## 2024-01-17 DIAGNOSIS — M25552 Pain in left hip: Secondary | ICD-10-CM | POA: Diagnosis not present

## 2024-01-17 DIAGNOSIS — K582 Mixed irritable bowel syndrome: Secondary | ICD-10-CM | POA: Diagnosis not present

## 2024-01-17 DIAGNOSIS — Z125 Encounter for screening for malignant neoplasm of prostate: Secondary | ICD-10-CM

## 2024-01-17 DIAGNOSIS — M25551 Pain in right hip: Secondary | ICD-10-CM | POA: Diagnosis not present

## 2024-01-17 DIAGNOSIS — F3342 Major depressive disorder, recurrent, in full remission: Secondary | ICD-10-CM

## 2024-01-17 DIAGNOSIS — F410 Panic disorder [episodic paroxysmal anxiety] without agoraphobia: Secondary | ICD-10-CM

## 2024-01-17 DIAGNOSIS — I10 Essential (primary) hypertension: Secondary | ICD-10-CM

## 2024-01-17 DIAGNOSIS — F419 Anxiety disorder, unspecified: Secondary | ICD-10-CM

## 2024-01-17 DIAGNOSIS — N5201 Erectile dysfunction due to arterial insufficiency: Secondary | ICD-10-CM

## 2024-01-17 MED ORDER — TADALAFIL 20 MG PO TABS: 20.0000 mg | ORAL_TABLET | ORAL | 3 refills | Status: AC | PRN

## 2024-01-17 MED ORDER — DIAZEPAM 5 MG PO TABS: 5.0000 mg | ORAL_TABLET | Freq: Two times a day (BID) | ORAL | 5 refills | Status: DC | PRN

## 2024-01-17 MED ORDER — BUPROPION HCL ER (XL) 150 MG PO TB24: 150.0000 mg | ORAL_TABLET | Freq: Every day | ORAL | 1 refills | Status: DC

## 2024-01-17 MED ORDER — CELECOXIB 200 MG PO CAPS: 200.0000 mg | ORAL_CAPSULE | Freq: Two times a day (BID) | ORAL | 5 refills | Status: AC

## 2024-01-17 MED ORDER — FAMOTIDINE 20 MG PO TABS: 20.0000 mg | ORAL_TABLET | Freq: Two times a day (BID) | ORAL | 1 refills | Status: DC

## 2024-01-17 MED ORDER — METOPROLOL TARTRATE 25 MG PO TABS: 25.0000 mg | ORAL_TABLET | Freq: Two times a day (BID) | ORAL | 1 refills | Status: DC

## 2024-01-17 MED ORDER — LISINOPRIL 10 MG PO TABS: 10.0000 mg | ORAL_TABLET | Freq: Every day | ORAL | 1 refills | Status: DC

## 2024-01-17 NOTE — Addendum Note (Signed)
 Addended by: GLADIS MUSTARD on: 01/17/2024 08:21 AM   Modules accepted: Orders

## 2024-01-17 NOTE — Patient Instructions (Signed)

## 2024-01-17 NOTE — Progress Notes (Addendum)
 Subjective:    Patient ID: Tyler Barton, male    DOB: [birth year], 53 y.o.   MRN: [id number]   Chief Complaint: annual physical    HPI:  Tyler Barton is a 53 y.o. who identifies as a male who was assigned male at birth.   Social history: Lives with: wife Work history: disability for anxiety   Comes in today for follow up of the following chronic medical issues:  1. Primary hypertension No c/o chest pain, sob or headache. He stopped taking lisinopril  cause he says he was just feeling bad. Since then his blood pressure has been running high. BP Readings from Last 3 Encounters:  07/20/23 118/86  07/05/23 103/69  04/07/23 109/73      2. Mixed hyperlipidmia Does not watch diet and does no dedicated exercise. Refuses statin therapy Lab Results  Component Value Date   CHOL 214 (H) 07/20/2023   HDL 39 (L) 07/20/2023   LDLCALC 154 (H) 07/20/2023   TRIG 118 07/20/2023   CHOLHDL 5.5 (H) 07/20/2023   The 10-year ASCVD risk score (Arnett DK, et al., 2019) is: 6.5%   3. Recurrent major depressive disorder, in full remission (HCC) 4. Anxiety 5. Panic disorder Patient  is a very anxious person, he worries about everything. He cannot let things from the past go. When he gets something on his mind he can't let it go.      01/17/2024    8:05 AM 07/20/2023    8:15 AM 07/05/2023    8:54 AM 01/21/2023   12:21 PM  GAD 7 : Generalized Anxiety Score  Nervous, Anxious, on Edge 1 1 2 1   Control/stop worrying 1 1 1 1   Worry too much - different things 1 1 1 1   Trouble relaxing 1 1 1  0  Restless 0 0 1 0  Easily annoyed or irritable 0 0 0 0  Afraid - awful might happen 0 0 0 0  Total GAD 7 Score 4 4 6 3   Anxiety Difficulty Somewhat difficult Somewhat difficult Somewhat difficult Somewhat difficult       01/17/2024    8:05 AM 07/20/2023    8:15 AM 01/22/2023    1:33 PM  Depression screen PHQ 2/9  Decreased Interest 0 1 0  Down, Depressed, Hopeless 0 0 0  PHQ - 2 Score 0 1 0   Altered sleeping  0 0  Tired, decreased energy  1 0  Change in appetite  1 0  Feeling bad or failure about yourself   0 0  Trouble concentrating  0 0  Moving slowly or fidgety/restless  0 0  Suicidal thoughts  0 0  PHQ-9 Score  3 0  Difficult doing work/chores  Somewhat difficult Not difficult at all    6. Irritable bowel syndrome with both constipation and diarrhea Has alternating constipation and diarrhea. Due to his anxiety.  7. Morbid obesity Weight is down 6lbs  Wt Readings from Last 3 Encounters:  01/17/24 215 lb (97.5 kg)  07/20/23 221 lb (100.2 kg)  07/05/23 222 lb (100.7 kg)   BMI Readings from Last 3 Encounters:  01/17/24 30.85 kg/m  07/20/23 31.71 kg/m  07/05/23 31.85 kg/m       New complaints: None today  No Known Allergies Outpatient Encounter Medications as of 01/17/2024  Medication Sig   buPROPion  (WELLBUTRIN  XL) 150 MG 24 hr tablet Take 1 tablet (150 mg total) by mouth daily.   celecoxib  (CELEBREX ) 200 MG capsule Take 1 capsule (200  mg total) by mouth 2 (two) times daily.   diazepam  (VALIUM ) 5 MG tablet Take 1 tablet (5 mg total) by mouth every 12 (twelve) hours as needed. for anxiety   famotidine  (PEPCID ) 20 MG tablet Take 1 tablet (20 mg total) by mouth 2 (two) times daily.   fluticasone  (FLONASE ) 50 MCG/ACT nasal spray Place 2 sprays into both nostrils daily.   lisinopril  (ZESTRIL ) 10 MG tablet Take 1 tablet (10 mg total) by mouth daily.   loratadine  (EQ ALLERGY RELIEF) 10 MG tablet Take 1 tablet (10 mg total) by mouth daily.   meclizine  (ANTIVERT ) 50 MG tablet Take 1 tablet (50 mg total) by mouth 3 (three) times daily as needed.   methocarbamol  (ROBAXIN ) 500 MG tablet Take 1 tablet (500 mg total) by mouth every 6 (six) hours as needed for muscle spasms.   metoprolol  tartrate (LOPRESSOR ) 25 MG tablet Take 1 tablet (25 mg total) by mouth 2 (two) times daily.   tadalafil  (CIALIS ) 20 MG tablet Take 1 tablet (20 mg total) by mouth as needed for  erectile dysfunction.   No facility-administered encounter medications on file as of 01/17/2024.    Past Surgical History:  Procedure Laterality Date   KNEE ARTHROSCOPY Left 1993   LIPOMA EXCISION      Family History  Problem Relation Age of Onset   Anxiety disorder Mother    Anxiety disorder Father    Heart disease Father 28       with stent placement and bypass   Anxiety disorder Sister    Cancer Maternal Uncle        throat   Alzheimer's disease Maternal Grandmother    Cancer Paternal Grandmother    Heart disease Maternal Uncle    Heart disease Maternal Uncle       Controlled substance contract: n/a     Review of Systems  Constitutional:  Negative for diaphoresis.  Eyes:  Negative for pain.  Respiratory:  Negative for shortness of breath.   Cardiovascular:  Negative for chest pain, palpitations and leg swelling.  Gastrointestinal:  Negative for abdominal pain.  Endocrine: Negative for polydipsia.  Skin:  Negative for rash.  Neurological:  Negative for dizziness, weakness and headaches.  Hematological:  Does not bruise/bleed easily.  All other systems reviewed and are negative.      Objective:   Physical Exam Vitals and nursing note reviewed.  Constitutional:      Appearance: Normal appearance. He is well-developed.  HENT:     Head: Normocephalic.     Nose: Nose normal.     Mouth/Throat:     Mouth: Mucous membranes are moist.     Pharynx: Oropharynx is clear.  Eyes:     Pupils: Pupils are equal, round, and reactive to light.  Neck:     Thyroid : No thyroid  mass or thyromegaly.     Vascular: No carotid bruit or JVD.     Trachea: Phonation normal.  Cardiovascular:     Rate and Rhythm: Normal rate and regular rhythm.  Pulmonary:     Effort: Pulmonary effort is normal. No respiratory distress.     Breath sounds: Normal breath sounds.  Abdominal:     General: Bowel sounds are normal.     Palpations: Abdomen is soft.     Tenderness: There is no  abdominal tenderness.  Musculoskeletal:        General: Normal range of motion.     Cervical back: Normal range of motion and neck supple.  Lymphadenopathy:  Cervical: No cervical adenopathy.  Skin:    General: Skin is warm and dry.  Neurological:     Mental Status: He is alert and oriented to person, place, and time.  Psychiatric:        Behavior: Behavior normal.        Thought Content: Thought content normal.        Judgment: Judgment normal.     BP 136/76   Pulse (!) 51   Temp (!) 97.2 F (36.2 C) (Temporal)   Ht 5' 10 (1.778 m)   Wt 215 lb (97.5 kg)   SpO2 98%   BMI 30.85 kg/m           Assessment & Plan:   DOYEL MULKERN comes in today with chief complaint of annual physical   Diagnosis and orders addressed:  1. Primary hypertension Low sodium diet Back on lisinopril  - lisinopril  (ZESTRIL ) 10 MG tablet; Take 1 tablet (10 mg total) by mouth daily.  Dispense: 90 tablet; Refill: 1 - CBC with Differential/Platelet - CMP14+EGFR - Lipid panel  2. Recurrent major depressive disorder, in full remission (HCC) Stress management - buPROPion  (WELLBUTRIN  XL) 150 MG 24 hr tablet; Take 1 tablet (150 mg total) by mouth daily.  Dispense: 90 tablet; Refill: 1  3. Anxiety - diazepam  (VALIUM ) 5 MG tablet; Take 1 tablet (5 mg total) by mouth every 12 (twelve) hours as needed. for anxiety  Dispense: 60 tablet; Refill: 5  4. Panic disorder Deep breathing exercises - ToxASSURE Select 13 (MW), Urine - buPROPion  (WELLBUTRIN  XL) 150 MG 24 hr tablet; Take 1 tablet (150 mg total) by mouth daily.  Dispense: 90 tablet; Refill: 1  5. Irritable bowel syndrome with both constipation and diarrhea Watch diet to help with flare ups - famotidine  (PEPCID ) 20 MG tablet; Take 1 tablet (20 mg total) by mouth 2 (two) times daily.  Dispense: 180 tablet; Refill: 1  6. Morbid obesity (HCC) Discussed diet and exercise for person with BMI >25 Will recheck weight in 3-6 months   7.  Erectile dysfunction due to arterial insufficiency - tadalafil  (CIALIS ) 20 MG tablet; Take 1 tablet (20 mg total) by mouth as needed for erectile dysfunction.  Dispense: 45 tablet; Refill: 3  8. Hip pain Continued- will refill celebrex   Labs pending Health Maintenance reviewed Diet and exercise encouraged  Follow up plan: 6 months   Mary-Margaret Gladis, FNP

## 2024-01-18 ENCOUNTER — Ambulatory Visit: Payer: Self-pay | Admitting: Nurse Practitioner

## 2024-01-18 DIAGNOSIS — E782 Mixed hyperlipidemia: Secondary | ICD-10-CM

## 2024-01-18 LAB — CMP14+EGFR
ALT: 15 IU/L (ref 0–44)
AST: 20 IU/L (ref 0–40)
Albumin: 4.3 g/dL (ref 3.8–4.9)
Alkaline Phosphatase: 37 IU/L — ABNORMAL LOW (ref 44–121)
BUN/Creatinine Ratio: 10 (ref 9–20)
BUN: 11 mg/dL (ref 6–24)
Bilirubin Total: 0.8 mg/dL (ref 0.0–1.2)
CO2: 22 mmol/L (ref 20–29)
Calcium: 9.4 mg/dL (ref 8.7–10.2)
Chloride: 103 mmol/L (ref 96–106)
Creatinine, Ser: 1.14 mg/dL (ref 0.76–1.27)
Globulin, Total: 2 g/dL (ref 1.5–4.5)
Glucose: 107 mg/dL — ABNORMAL HIGH (ref 70–99)
Potassium: 4.1 mmol/L (ref 3.5–5.2)
Sodium: 140 mmol/L (ref 134–144)
Total Protein: 6.3 g/dL (ref 6.0–8.5)
eGFR: 77 mL/min/1.73 (ref >59)

## 2024-01-18 LAB — PSA, TOTAL AND FREE
PSA, Free Pct: 56.7
PSA, Free: 0.34 ng/mL
Prostate Specific Ag, Serum: 0.6 ng/mL (ref 0.0–4.0)

## 2024-01-18 LAB — CBC WITH DIFFERENTIAL/PLATELET
Basophils Absolute: 0.1 x10E3/uL (ref 0.0–0.2)
Basos: 1 %
EOS (ABSOLUTE): 0.3 x10E3/uL (ref 0.0–0.4)
Eos: 6 %
Hematocrit: 43.8 % (ref 37.5–51.0)
Hemoglobin: 14.2 g/dL (ref 13.0–17.7)
Immature Grans (Abs): 0 x10E3/uL (ref 0.0–0.1)
Immature Granulocytes: 0 %
Lymphocytes Absolute: 1.8 x10E3/uL (ref 0.7–3.1)
Lymphs: 30 %
MCH: 30.3 pg (ref 26.6–33.0)
MCHC: 32.4 g/dL (ref 31.5–35.7)
MCV: 94 fL (ref 79–97)
Monocytes Absolute: 0.5 x10E3/uL (ref 0.1–0.9)
Monocytes: 9 %
Neutrophils Absolute: 3.3 x10E3/uL (ref 1.4–7.0)
Neutrophils: 54 %
Platelets: 326 x10E3/uL (ref 150–450)
RBC: 4.68 x10E6/uL (ref 4.14–5.80)
RDW: 13.2 % (ref 11.6–15.4)
WBC: 6 x10E3/uL (ref 3.4–10.8)

## 2024-01-18 LAB — LIPID PANEL
Chol/HDL Ratio: 5.8 ratio — ABNORMAL HIGH (ref 0.0–5.0)
Cholesterol, Total: 208 mg/dL — ABNORMAL HIGH (ref 100–199)
HDL: 36 mg/dL — ABNORMAL LOW (ref >39)
LDL Chol Calc (NIH): 147 mg/dL — ABNORMAL HIGH (ref 0–99)
Triglycerides: 140 mg/dL (ref 0–149)
VLDL Cholesterol Cal: 25 mg/dL (ref 5–40)

## 2024-01-19 LAB — TOXASSURE SELECT 13 (MW), URINE

## 2024-01-20 MED ORDER — ATORVASTATIN CALCIUM 40 MG PO TABS: 40.0000 mg | ORAL_TABLET | Freq: Every day | ORAL | 5 refills | Status: DC

## 2024-01-24 ENCOUNTER — Ambulatory Visit: Payer: Medicare Other

## 2024-01-24 VITALS — BP 136/75 | HR 51 | Ht 70.0 in | Wt 215.0 lb

## 2024-01-24 DIAGNOSIS — Z0000 Encounter for general adult medical examination without abnormal findings: Secondary | ICD-10-CM

## 2024-01-24 NOTE — Progress Notes (Signed)
 Subjective:   Tyler Barton is a 53 y.o. who presents for a Medicare Wellness preventive visit.  As a reminder, Annual Wellness Visits don't include a physical exam, and some assessments may be limited, especially if this visit is performed virtually. We may recommend an in-person follow-up visit with your provider if needed.  Visit Complete: Virtual I connected with  Tyler Barton on 01/24/24 by a audio enabled telemedicine application and verified that I am speaking with the correct person using two identifiers.  Patient Location: Home  Provider Location: Home Office  I discussed the limitations of evaluation and management by telemedicine. The patient expressed understanding and agreed to proceed.  Vital Signs: Because this visit was a virtual/telehealth visit, some criteria may be missing or patient reported. Any vitals not documented were not able to be obtained and vitals that have been documented are patient reported.  VideoDeclined- This patient declined Librarian, academic. Therefore the visit was completed with audio only.  Persons Participating in Visit: Patient.  AWV Questionnaire: No: Patient Medicare AWV questionnaire was not completed prior to this visit.  Cardiac Risk Factors include: advanced age (>16men, >20 women)     Objective:    Today's Vitals   01/24/24 1044  BP: 136/75  Pulse: (!) 51  Weight: 215 lb (97.5 kg)  Height: 5' 10 (1.778 m)   Body mass index is 30.85 kg/m.     01/24/2024   10:51 AM 03/01/2023    8:30 AM 01/22/2023    1:31 PM 07/18/2022    8:02 PM 01/19/2022    9:06 AM 01/16/2021    9:24 AM 10/17/2019    8:43 AM  Advanced Directives  Does Patient Have a Medical Advance Directive? No No No No No No No  Would patient like information on creating a medical advance directive?  No - Patient declined No - Patient declined No - Patient declined No - Patient declined No - Patient declined No - Patient declined     Current Medications (verified) Outpatient Encounter Medications as of 01/24/2024  Medication Sig   atorvastatin  (LIPITOR) 40 MG tablet Take 1 tablet (40 mg total) by mouth daily.   buPROPion  (WELLBUTRIN  XL) 150 MG 24 hr tablet Take 1 tablet (150 mg total) by mouth daily.   celecoxib  (CELEBREX ) 200 MG capsule Take 1 capsule (200 mg total) by mouth 2 (two) times daily.   diazepam  (VALIUM ) 5 MG tablet Take 1 tablet (5 mg total) by mouth every 12 (twelve) hours as needed. for anxiety   famotidine  (PEPCID ) 20 MG tablet Take 1 tablet (20 mg total) by mouth 2 (two) times daily.   fluticasone  (FLONASE ) 50 MCG/ACT nasal spray Place 2 sprays into both nostrils daily.   lisinopril  (ZESTRIL ) 10 MG tablet Take 1 tablet (10 mg total) by mouth daily.   loratadine  (EQ ALLERGY RELIEF) 10 MG tablet Take 1 tablet (10 mg total) by mouth daily.   meclizine  (ANTIVERT ) 50 MG tablet Take 1 tablet (50 mg total) by mouth 3 (three) times daily as needed.   methocarbamol  (ROBAXIN ) 500 MG tablet Take 1 tablet (500 mg total) by mouth every 6 (six) hours as needed for muscle spasms.   metoprolol  tartrate (LOPRESSOR ) 25 MG tablet Take 1 tablet (25 mg total) by mouth 2 (two) times daily.   tadalafil  (CIALIS ) 20 MG tablet Take 1 tablet (20 mg total) by mouth as needed for erectile dysfunction.   No facility-administered encounter medications on file as of 01/24/2024.  Allergies (verified) Patient has no known allergies.   History: Past Medical History:  Diagnosis Date   Anxiety    COVID-19 virus infection 04/2020   a. Ss onset 04/23/2020.   DDD (degenerative disc disease)    Morbid obesity (HCC)    Past Surgical History:  Procedure Laterality Date   KNEE ARTHROSCOPY Left 1993   LIPOMA EXCISION     Family History  Problem Relation Age of Onset   Anxiety disorder Mother    Anxiety disorder Father    Heart disease Father 75       with stent placement and bypass   Anxiety disorder Sister    Cancer  Maternal Uncle        throat   Alzheimer's disease Maternal Grandmother    Cancer Paternal Grandmother    Heart disease Maternal Uncle    Heart disease Maternal Uncle    Social History   Socioeconomic History   Marital status: Married    Spouse name: Santana   Number of children: 4   Years of education: 10   Highest education level: 10th grade  Occupational History   Occupation: disabled    Comment: truck driver  Tobacco Use   Smoking status: Never   Smokeless tobacco: Never  Vaping Use   Vaping status: Never Used  Substance and Sexual Activity   Alcohol use: Yes    Comment: ocassionally - rare   Drug use: No   Sexual activity: Yes  Other Topics Concern   Not on file  Social History Narrative   L[place]ik resides locally in [place] with his wife and step son. He has four children of his own and one was adopted because he had the child at the early age of 43. He has two step children. 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 He enjoys working in his shop and piddling with cars. He has recently been diagnosed with a hernia and this is keeping him from doing anything strenuous.    Social Drivers of Corporate investment banker Strain: Low Risk  (01/24/2024)   Overall Financial Resource Strain (CARDIA)    Difficulty of Paying Living Expenses: Not hard at all  Food Insecurity: No Food Insecurity (01/24/2024)   Hunger Vital Sign    Worried About Running Out of Food in the Last Year: Never true    Ran Out of Food in the Last Year: Never true  Transportation Needs: No Transportation Needs (01/24/2024)   PRAPARE - Administrator, Civil Service (Medical): No    Lack of Transportation (Non-Medical): No  Physical Activity: Inactive (01/24/2024)   Exercise Vital Sign    Days of Exercise per Week: 0 days    Minutes of Exercise per Session: 0 min  Stress: Stress Concern Present (01/24/2024)   [employer] of Occupational Health - Occupational Stress  Questionnaire    Feeling of Stress: To some extent  Social Connections: Moderately Isolated (01/24/2024)   Social Connection and Isolation Panel    Frequency of Communication with Friends and Family: More than three times a week    Frequency of Social Gatherings with Friends and Family: Once a week    Attends Religious Services: Never    Database administrator or Organizations: No    Attends Banker Meetings: Never    Marital Status: Married    Tobacco Counseling Counseling given: Yes    Clinical Intake:  Pre-visit preparation completed: Yes  Pain :  No/denies pain     BMI - recorded: 30.85 Nutritional Status: BMI > 30  Obese Nutritional Risks: None Diabetes: No  No results found for: HGBA1C   How often do you need to have someone help you when you read instructions, pamphlets, or other written materials from your doctor or pharmacy?: 1 - Never  Interpreter Needed?: No  Information entered by :: alia t/cma   Activities of Daily Living     01/24/2024   10:51 AM  In your present state of health, do you have any difficulty performing the following activities:  Hearing? 0  Vision? 0  Difficulty concentrating or making decisions? 0  Walking or climbing stairs? 0  Dressing or bathing? 0  Doing errands, shopping? 0  Preparing Food and eating ? N  Using the Toilet? N  In the past six months, have you accidently leaked urine? N  Do you have problems with loss of bowel control? N  Managing your Medications? N  Managing your Finances? N  Housekeeping or managing your Housekeeping? N    Patient Care Team: Gladis Mustard, FNP as PCP - General (Family Medicine) Stacia Diannah SQUIBB, MD as PCP - Cardiology (Cardiology) Ladora Ross Lacy Phebe, MD as Referring Physician (Optometry)  I have updated your Care Teams any recent Medical Services you may have received from other providers in the past year.     Assessment:   This is a routine wellness  examination for Wilder.  Hearing/Vision screen Hearing Screening - Comments:: Pt denies hearing dif Vision Screening - Comments:: Pt has vision dif w/fine print/ pt use readers glasses pt goes to [facility],[place]/last ov 2025   Goals Addressed               This Visit's Progress     Patient Stated (pt-stated)   On track     Patient states his goal is to lose weight and to remain active.        Depression Screen     01/24/2024   11:11 AM 01/17/2024    8:05 AM 07/20/2023    8:15 AM 01/22/2023    1:33 PM 01/21/2023   12:20 PM 01/15/2023    8:07 AM 08/26/2022   11:27 AM  PHQ 2/9 Scores  PHQ - 2 Score 0 0 1 0 0 0 0  PHQ- 9 Score   3 0 2 2 3     Fall Risk     01/24/2024   10:46 AM 01/17/2024    8:05 AM 07/20/2023    8:15 AM 07/05/2023    8:53 AM 01/22/2023    1:31 PM  Fall Risk   Falls in the past year? 0 0 0 0 0  Number falls in past yr: 0   0 0  Injury with Fall? 0   0 0  Risk for fall due to : No Fall Risks   No Fall Risks No Fall Risks  Follow up Falls evaluation completed   Falls evaluation completed Falls prevention discussed    MEDICARE RISK AT HOME:  Medicare Risk at Home Any stairs in or around the home?: Yes If so, are there any without handrails?: Yes Home free of loose throw rugs in walkways, pet beds, electrical cords, etc?: Yes Adequate lighting in your home to reduce risk of falls?: Yes Life alert?: No Use of a cane, walker or w/c?: No Grab bars in the bathroom?: No Shower chair or bench in shower?: No Elevated toilet seat or a handicapped toilet?: No  TIMED UP AND GO:  Was the test performed?  no  Cognitive Function: 6CIT completed    03/08/2018    5:01 PM  MMSE - Mini Mental State Exam  Not completed: Unable to complete        01/24/2024   10:54 AM 01/22/2023    1:36 PM 01/19/2022    9:07 AM 10/17/2019    8:44 AM  6CIT Screen  What Year? 0 points 0 points 0 points 0 points  What month? 0 points 0 points 0 points 0 points  What time? 0 points 0  points 0 points 0 points  Count back from 20 0 points 0 points 0 points 0 points  Months in reverse 2 points 0 points 4 points 0 points  Repeat phrase 0 points 0 points 2 points 0 points  Total Score 2 points 0 points 6 points 0 points    Immunizations  There is no immunization history on file for this patient.  Screening Tests Health Maintenance  Topic Date Due   Hepatitis B Vaccines (1 of 3 - 19+ 3-dose series) Never done   COVID-19 Vaccine (1 - 2024-25 season) 02/02/2024 (Originally 03/07/2023)   Zoster Vaccines- Shingrix (1 of 2) 04/18/2024 (Originally 03/13/2020)   DTaP/Tdap/Td (1 - Tdap) 07/19/2024 (Originally 03/13/1989)   Pneumococcal Vaccine 26-32 Years old (1 of 2 - PCV) 07/19/2024 (Originally 03/13/1989)   INFLUENZA VACCINE  02/04/2024   Fecal DNA (Cologuard)  10/28/2024   Medicare Annual Wellness (AWV)  01/23/2025   Hepatitis C Screening  Completed   HIV Screening  Completed   HPV VACCINES  Aged Out   Meningococcal B Vaccine  Aged Out    Health Maintenance  Health Maintenance Due  Topic Date Due   Hepatitis B Vaccines (1 of 3 - 19+ 3-dose series) Never done   Health Maintenance Items Addressed: See Nurse Notes at the end of this note  Additional Screening:  Vision Screening: Recommended annual ophthalmology exams for early detection of glaucoma and other disorders of the eye. Would you like a referral to an eye doctor? No    Dental Screening: Recommended annual dental exams for proper oral hygiene  Community Resource Referral / Chronic Care Management: CRR required this visit?  No   CCM required this visit?  No   Plan:    I have personally reviewed and noted the following in the patient's chart:   Medical and social history Use of alcohol, tobacco or illicit drugs  Current medications and supplements including opioid prescriptions. Patient is not currently taking opioid prescriptions. Functional ability and status Nutritional status Physical  activity Advanced directives List of other physicians Hospitalizations, surgeries, and ER visits in previous 12 months Vitals Screenings to include cognitive, depression, and falls Referrals and appointments  In addition, I have reviewed and discussed with patient certain preventive protocols, quality metrics, and best practice recommendations. A written personalized care plan for preventive services as well as general preventive health recommendations were provided to patient.   Ozie Ned, CMA   01/24/2024   After Visit Summary: (MyChart) Due to this being a telephonic visit, the after visit summary with patients personalized plan was offered to patient via MyChart   Notes: Nothing significant to report at this time.

## 2024-01-24 NOTE — Patient Instructions (Signed)
 Mr. Tyler Barton , Thank you for taking time out of your busy schedule to complete your Annual Wellness Visit with me. I enjoyed our conversation and look forward to speaking with you again next year. I, as well as your care team,  appreciate your ongoing commitment to your health goals. Please review the following plan we discussed and let me know if I can assist you in the future. Your Game plan/ To Do List    Follow up Visits: Next Medicare AWV with our clinical staff: 01/24/25 at 10:40a.m.   Next Office Visit with your provider: 07/20/24 at 8:00a.m.  Clinician Recommendations:  Aim for 30 minutes of exercise or brisk walking, 6-8 glasses of water, and 5 servings of fruits and vegetables each day.       This is a list of the screening recommended for you and due dates:  Health Maintenance  Topic Date Due   Hepatitis B Vaccine (1 of 3 - 19+ 3-dose series) Never done   COVID-19 Vaccine (1 - 2024-25 season) 02/02/2024*   Zoster (Shingles) Vaccine (1 of 2) 04/18/2024*   DTaP/Tdap/Td vaccine (1 - Tdap) 07/19/2024*   Pneumococcal Vaccination (1 of 2 - PCV) 07/19/2024*   Flu Shot  02/04/2024   Cologuard (Stool DNA test)  10/28/2024   Medicare Annual Wellness Visit  01/23/2025   Hepatitis C Screening  Completed   HIV Screening  Completed   HPV Vaccine  Aged Out   Meningitis B Vaccine  Aged Out  *Topic was postponed. The date shown is not the original due date.    Advanced directives: (Declined) Advance directive discussed with you today. Even though you declined this today, please call our office should you change your mind, and we can give you the proper paperwork for you to fill out. Advance Care Planning is important because it:  [x]  Makes sure you receive the medical care that is consistent with your values, goals, and preferences  [x]  It provides guidance to your family and loved ones and reduces their decisional burden about whether or not they are making the right decisions based on your  wishes.  Follow the link provided in your after visit summary or read over the paperwork we have mailed to you to help you started getting your Advance Directives in place. If you need assistance in completing these, please reach out to us  so that we can help you!  See attachments for Preventive Care and Fall Prevention Tips.

## 2024-02-25 ENCOUNTER — Encounter: Payer: Self-pay | Admitting: Radiology

## 2024-03-06 DIAGNOSIS — J069 Acute upper respiratory infection, unspecified: Secondary | ICD-10-CM | POA: Diagnosis not present

## 2024-04-03 ENCOUNTER — Ambulatory Visit: Payer: Self-pay

## 2024-04-03 NOTE — Telephone Encounter (Signed)
 Appt 9/30 with MMM

## 2024-04-03 NOTE — Telephone Encounter (Signed)
 FYI Only or Action Required?: FYI only for provider.  Patient was last seen in primary care on 01/17/2024 by Gladis Mustard, FNP.  Called Nurse Triage reporting Numbness.  Symptoms began several months ago.  Interventions attempted: Nothing.  Symptoms are: gradually worsening.  Triage Disposition: See PCP When Office is Open (Within 3 Days)  Patient/caregiver understands and will follow disposition?: Yes        Copied from CRM #[id number]. Topic: Clinical - Red Word Triage >> Apr 03, 2024  7:41 AM Larissa RAMAN wrote: [facility] that prompted transfer to Nurse Triage: numbness in rt foot Reason for Disposition  [1] Weakness or numbness in foot or toes AND [2] present > 2 weeks  Answer Assessment - Initial Assessment Questions 1. ONSET: When did the numbness start?      A few months 2. LOCATION: Where is the pain located?      R foot, bilateral hands 3. PAIN: How bad is the pain?    (Scale 1-10; or mild, moderate, severe)     Occasionally affects walking/sitting -- reports has to change positions to alleviate worsening numbness 4. WORK OR EXERCISE: Has there been any recent work or exercise that involved this part of the body?      Denies  5. CAUSE: What do you think is causing the foot pain?     Unknown. Initially thought r/t sciatica 6. OTHER SYMPTOMS: Do you have any other symptoms? (e.g., leg pain, rash, fever, numbness)     numbness 7. PREGNANCY: Is there any chance you are pregnant? When was your last menstrual period?     N/a  Protocols used: Foot Pain-A-AH

## 2024-04-04 ENCOUNTER — Encounter: Payer: Self-pay | Admitting: Nurse Practitioner

## 2024-04-04 ENCOUNTER — Ambulatory Visit (INDEPENDENT_AMBULATORY_CARE_PROVIDER_SITE_OTHER): Admitting: Nurse Practitioner

## 2024-04-04 VITALS — BP 123/82 | HR 55 | Temp 97.5°F | Ht 70.0 in | Wt 211.0 lb

## 2024-04-04 DIAGNOSIS — R200 Anesthesia of skin: Secondary | ICD-10-CM

## 2024-04-04 NOTE — Progress Notes (Signed)
 Subjective:    Patient ID: Tyler Barton, male    DOB: 1971/06/02, 53 y.o.   MRN: [id number]   Chief Complaint: foot numbness (Right foot. Feels numb most of time and cold. Hands also go numb sometimes)   HPI  Patient come sin  today c/o right foot staying numb all the time. He says he can feel on the bottom of his foot but it is a light feeling. Not like the other foot. Has been coming on for several months. He also says that his hands go numb when he is driving. Causing him to have to change position of his hand when driving. Patient Active Problem List   Diagnosis Date Noted   HLD (hyperlipidemia) 03/09/2023   Cardiac chest pain 03/09/2023   Family history of premature CAD 03/09/2023   Primary hypertension 10/21/2021   Anxiety    Morbid obesity (HCC)    Chronic pain of left knee 11/01/2017   Recurrent major depressive disorder, in full remission 05/07/2017   IBS (irritable bowel syndrome) 11/21/2014   Panic disorder 03/14/2013       Review of Systems  Constitutional:  Negative for diaphoresis.  Eyes:  Negative for pain.  Respiratory:  Negative for shortness of breath.   Cardiovascular:  Negative for chest pain, palpitations and leg swelling.  Gastrointestinal:  Negative for abdominal pain.  Endocrine: Negative for polydipsia.  Skin:  Negative for rash.  Neurological:  Negative for dizziness, weakness and headaches.  Hematological:  Does not bruise/bleed easily.  All other systems reviewed and are negative.      Objective:   Physical Exam Constitutional:      Appearance: Normal appearance.  Cardiovascular:     Rate and Rhythm: Normal rate and regular rhythm.     Heart sounds: Normal heart sounds.  Pulmonary:     Effort: Pulmonary effort is normal.     Breath sounds: Normal breath sounds.  Musculoskeletal:     Comments: Light monofilament positive on right foot. Callus on ball of foot Grips equal bil (+) phalen bil hands (+) tinel bil hands   Skin:     General: Skin is warm.  Neurological:     General: No focal deficit present.     Mental Status: He is alert and oriented to person, place, and time.  Psychiatric:        Mood and Affect: Mood normal.        Behavior: Behavior normal.    BP 123/82   Pulse (!) 55   Temp (!) 97.5 F (36.4 C) (Temporal)   Ht 5' 10 (1.778 m)   Wt 211 lb (95.7 kg)   SpO2 98%   BMI 30.28 kg/m         Assessment & Plan:   [name] in today with chief complaint of foot numbness (Right foot. Feels numb most of time and cold. Hands also go numb sometimes)   1. Numbness of right foot (Primary) - Ambulatory referral to Orthopedic Surgery  2. Bilateral finger numbness Move hands frequently Can wear wrist braces if helps RTO prn - Ambulatory referral to Orthopedic Surgery    The above assessment and management plan was discussed with the patient. The patient verbalized understanding of and has agreed to the management plan. Patient is aware to call the clinic if symptoms persist or worsen. Patient is aware when to return to the clinic for a follow-up visit. Patient educated on when it is appropriate to go to the emergency  department.   Mary-Margaret Gladis, FNP

## 2024-04-04 NOTE — Patient Instructions (Signed)
 Pinched Nerve in the Wrist (Carpal Tunnel Syndrome): What to Know  Pinched nerve in the wrist (carpal tunnel syndrome, or CTS) is a nerve problem that causes pain, numbness, and weakness in the wrist, hand, and fingers. The carpal tunnel is a narrow space that is on the palm side of your wrist. Repeated wrist motions or certain diseases may cause swelling in the tunnel. This swelling can pinch the main nerve in the wrist (the median nerve). What are the causes? CTS may be caused by: Moving your hand and wrist over and over again while doing a task. Hurting the wrist. Arthritis. A pocket of fluid (cyst) or a growth (tumor) in the carpal tunnel. Fluid buildup when you are pregnant. Use of tools that vibrate. In some cases, the cause of CTS is not known. What increases the risk? You're more likely to have CTS if: You have a job that makes you do these things: Move your hand firmly over and over again. Work with tools that vibrate, such as drills or sanders. You're male. You have diabetes, obesity, thyroid problems, or kidney failure. What are the signs or symptoms? Symptoms of this condition include: A tingling feeling in your fingers. You may feel this pain in the thumb, index finger, or middle finger. Tingling or loss of feeling in your hand. Pain in your entire arm. This pain may get worse when you bend your wrist and elbow for a long time. Pain in your wrist that goes up your arm to your shoulder. Pain that goes down into your palm or fingers. Weakness in your hands. You may find it hard to grab and hold items. Your symptoms may feel worse during the night. How is this diagnosed? CTS is diagnosed with a medical history and physical exam. Tests and imaging may also be done to: Check the electrical signals sent by your nerves into the muscles. Check how well electrical signals pass through your nerves. Check possible causes of your CTS. These include X-rays, ultrasound, and  MRI. How is this treated? CTS may be treated with: Lifestyle changes. You will be asked to stop or change the activity that caused your problem. Physical therapy. This may include: Exercises that stretch and strengthen the muscles and tendons in the wrist and hand. Nerve gliding or flossing exercises. These help keep nerves moving smoothly through the tissues around them. Occupational therapy. You'll learn how to use your hand again. Medicines for pain and swelling. You may have injections in your wrist. A wrist splint or brace. Surgery. Follow these instructions at home: If you have a splint or brace: Wear the splint or brace as told. Take it off only if your provider says you can. Check the skin around it every day. Tell your provider if you see problems. Loosen the splint or brace if your fingers tingle, are numb, or turn cold and blue. Keep the splint or brace clean and dry. If the splint or brace isn't waterproof: Do not let it get wet. Cover it when you take a bath or shower. Use a cover that doesn't let any water in. Managing pain, stiffness, and swelling  Use ice or an ice pack as told. If you have a splint or brace that you can take off, remove it only as told. Place a towel between your skin and the ice. Leave the ice on for 20 minutes, 2-3 times a day. If your skin turns red, take off the ice right away to prevent skin damage. The risk  of damage is higher if you can't feel pain, heat, or cold. Move your fingers often to reduce stiffness and swelling. General instructions Take your medicines only as told. Rest your wrist and hand from activity that may cause pain. If your CTS is caused by things you do at work, talk with your employer about making changes. For example, you may need a wrist pad to use while typing. Exercise as told. Follow instructions on how to do nerve gliding or flossing exercises. These help keep nerves in moving smoothly through the tissues around  them. Keep all follow-up visits. This is important. Where to find more information American Academy of Orthopedic Surgeons: orthoinfo.aaos.[employer] of Neurological Disorders and Stroke: BasicFM.no Contact a health care provider if: You have new symptoms. Your pain is not controlled with medicines. Your symptoms get worse. Get help right away if: Your hand or wrist tingles or is numb, and the symptoms become very bad. This information is not intended to replace advice given to you by your health care provider. Make sure you discuss any questions you have with your health care provider. Document Revised: 05/04/2023 Document Reviewed: 02/19/2023 Elsevier Patient Education  2024 [employer].

## 2024-04-18 DIAGNOSIS — G5603 Carpal tunnel syndrome, bilateral upper limbs: Secondary | ICD-10-CM | POA: Diagnosis not present

## 2024-05-08 ENCOUNTER — Encounter: Payer: Self-pay | Admitting: Radiology

## 2024-07-07 ENCOUNTER — Ambulatory Visit: Payer: Self-pay | Admitting: Family Medicine

## 2024-07-07 ENCOUNTER — Ambulatory Visit (INDEPENDENT_AMBULATORY_CARE_PROVIDER_SITE_OTHER): Admitting: Family Medicine

## 2024-07-07 ENCOUNTER — Encounter: Payer: Self-pay | Admitting: Family Medicine

## 2024-07-07 ENCOUNTER — Ambulatory Visit: Payer: Self-pay

## 2024-07-07 VITALS — BP 129/83 | HR 80 | Temp 97.8°F | Ht 70.0 in | Wt 214.2 lb

## 2024-07-07 DIAGNOSIS — J3489 Other specified disorders of nose and nasal sinuses: Secondary | ICD-10-CM | POA: Diagnosis not present

## 2024-07-07 DIAGNOSIS — R051 Acute cough: Secondary | ICD-10-CM

## 2024-07-07 DIAGNOSIS — R509 Fever, unspecified: Secondary | ICD-10-CM | POA: Diagnosis not present

## 2024-07-07 DIAGNOSIS — Z20828 Contact with and (suspected) exposure to other viral communicable diseases: Secondary | ICD-10-CM

## 2024-07-07 LAB — VERITOR SARS-COV-2 AND FLU A+B
BD Veritor SARS-CoV-2 Ag: NEGATIVE
Influenza A: NEGATIVE
Influenza B: NEGATIVE

## 2024-07-07 MED ORDER — OSELTAMIVIR PHOSPHATE 75 MG PO CAPS: 75.0000 mg | ORAL_CAPSULE | Freq: Two times a day (BID) | ORAL | 0 refills | Status: AC

## 2024-07-07 NOTE — Telephone Encounter (Signed)
 FYI Only or Action Required?: FYI only for provider: appointment scheduled on 07/08/2023 at 11:05am with Rock Bruns FNP at PCP office.  Patient was last seen in primary care on 04/04/2024 by Gladis Mustard, FNP.  Called Nurse Triage reporting Cough.  Symptoms began 3 days ago.  Interventions attempted: OTC medications: Mucinex , Sinus medication, Flonase , Dayquil and Rest, hydration, or home remedies.  Symptoms are: gradually worsening.  Triage Disposition: See Physician Within 24 Hours  Patient/caregiver understands and will follow disposition?: Yes             Copied from CRM #[id number]. Topic: Clinical - Red Word Triage >> Jul 07, 2024  7:48 AM Ivette P wrote: Red Word that prompted transfer to Nurse Triage: couhg- started   numbness in the forehead. major sinus issue. havent been getting fever, congestion chest, and alot of pressure. really bad couhg.     Roof of mouth like nostrol Drainage - morning time draingage is colored. Reason for Disposition  [1] Continuous (nonstop) coughing interferes with work or school AND [2] no improvement using cough treatment per Care Advice  Answer Assessment - Initial Assessment Questions Symptoms started 3 days ago Cough with yellowish mucous Low grade fever 99.1 Drainage Roof of mouth sore--patient states from sinus pressure and drainage Sinus pressure, runny nose Patient denies previous diagnosed Patient has been taking Mucinex , Sinus medication, Flonase , Dayquil Patient states symptoms getting worse each day.  Patient is advised to call us  back if anything changes or with any further questions/concerns. Patient is advised that if anything worsens to go to the Emergency Room. Patient verbalized understanding.  Protocols used: Cough - Acute Productive-A-AH

## 2024-07-07 NOTE — Progress Notes (Signed)
 "    Subjective:  Patient ID: Tyler Barton, male    DOB: 01-30-1971, 54 y.o.   MRN: [id number]  Patient Care Team: Gladis Mustard, FNP as PCP - General (Family Medicine) Stacia Diannah SQUIBB, MD as PCP - Cardiology (Cardiology) Ladora Ross Lacy Phebe, MD as Referring Physician (Optometry)   Chief Complaint:  Cough, Nasal Congestion, Headache, and Chills (X 2-3 days )   HPI: Tyler Barton is a 54 y.o. male presenting on 07/07/2024 for Cough, Nasal Congestion, Headache, and Chills (X 2-3 days )   Tyler Barton is a 54 year old male who presents with sinus congestion and pressure.  He has been experiencing significant sinus drainage for the past two and a half days, leading to throat irritation and coughing. There is soreness in the nostrils and the roof of his mouth due to the sinus drainage. He feels pressure in his forehead associated with sinus congestion.  He has a history of sinus issues but has not experienced such symptoms in a long time. He takes a sinus pill every morning, which helps alleviate the headache. Additionally, he uses Flonase  every morning and takes Mucinex  to prevent congestion from worsening. A hot shower with steam provided temporary relief.  He experiences chills intermittently but no significant fever, with the highest recorded temperature being 99.33F. He feels generally unwell and lethargic, stating that he does not feel like getting off the couch.  His wife recently became ill with similar symptoms, including a cough, but without chills. He performed a home flu test, which showed a faint line for flu A, but the result was not clearly positive.  He has a history of panic attacks and anxiety, which have been exacerbated by the difficulty in clearing his throat congestion.          Relevant past medical, surgical, family, and social history reviewed and updated as indicated.  Allergies and medications reviewed and updated. Data reviewed: Chart in  Epic.   Past Medical History:  Diagnosis Date   Anxiety    COVID-19 virus infection 04/2020   a. Ss onset 04/23/2020.   DDD (degenerative disc disease)    Morbid obesity (HCC)     Past Surgical History:  Procedure Laterality Date   KNEE ARTHROSCOPY Left 1993   LIPOMA EXCISION      Social History   Socioeconomic History   Marital status: Married    Spouse name: Santana   Number of children: 4   Years of education: 10   Highest education level: 10th grade  Occupational History   Occupation: disabled    Comment: truck driver  Tobacco Use   Smoking status: Never   Smokeless tobacco: Never  Vaping Use   Vaping status: Never Used  Substance and Sexual Activity   Alcohol use: Yes    Comment: ocassionally - rare   Drug use: No   Sexual activity: Yes  Other Topics Concern   Not on file  Social History Narrative   Tyler Barton resides locally in [place] with his wife and step son. He has four children of his own and one was adopted because he had the child at the early age of 7. He has two step children. 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 He enjoys working in his shop and piddling with cars. He has recently been diagnosed with a hernia and this is keeping him from doing anything strenuous.    Social Drivers of Health  Tobacco Use: Low Risk (07/07/2024)   Patient History    Smoking Tobacco Use: Never    Smokeless Tobacco Use: Never    Passive Exposure: Not on file  Financial Resource Strain: Low Risk (01/24/2024)   Overall Financial Resource Strain (CARDIA)    Difficulty of Paying Living Expenses: Not hard at all  Food Insecurity: No Food Insecurity (01/24/2024)   Epic    Worried About Programme Researcher, Broadcasting/film/video in the Last Year: Never true    Ran Out of Food in the Last Year: Never true  Transportation Needs: No Transportation Needs (01/24/2024)   Epic    Lack of Transportation (Medical): No    Lack of Transportation (Non-Medical): No  Physical Activity:  Inactive (01/24/2024)   Exercise Vital Sign    Days of Exercise per Week: 0 days    Minutes of Exercise per Session: 0 min  Stress: Stress Concern Present (01/24/2024)   [employer] of Occupational Health - Occupational Stress Questionnaire    Feeling of Stress: To some extent  Social Connections: Moderately Isolated (01/24/2024)   Social Connection and Isolation Panel    Frequency of Communication with Friends and Family: More than three times a week    Frequency of Social Gatherings with Friends and Family: Once a week    Attends Religious Services: Never    Database Administrator or Organizations: No    Attends Banker Meetings: Never    Marital Status: Married  Catering Manager Violence: Not At Risk (01/24/2024)   Epic    Fear of Current or Ex-Partner: No    Emotionally Abused: No    Physically Abused: No    Sexually Abused: No  Depression (PHQ2-9): Medium Risk (04/04/2024)   Depression (PHQ2-9)    PHQ-2 Score: 8  Alcohol Screen: Low Risk (01/24/2024)   Alcohol Screen    Last Alcohol Screening Score (AUDIT): 0  Housing: Unknown (01/24/2024)   Epic    Unable to Pay for Housing in the Last Year: No    Number of Times Moved in the Last Year: Not on file    Homeless in the Last Year: No  Utilities: Not At Risk (01/24/2024)   Epic    Threatened with loss of utilities: No  Health Literacy: Adequate Health Literacy (01/24/2024)   B1300 Health Literacy    Frequency of need for help with medical instructions: Never    Outpatient Encounter Medications as of 07/07/2024  Medication Sig   atorvastatin  (LIPITOR) 40 MG tablet Take 1 tablet (40 mg total) by mouth daily.   buPROPion  (WELLBUTRIN  XL) 150 MG 24 hr tablet Take 1 tablet (150 mg total) by mouth daily.   celecoxib  (CELEBREX ) 200 MG capsule Take 1 capsule (200 mg total) by mouth 2 (two) times daily.   diazepam  (VALIUM ) 5 MG tablet Take 1 tablet (5 mg total) by mouth every 12 (twelve) hours as needed. for anxiety    famotidine  (PEPCID ) 20 MG tablet Take 1 tablet (20 mg total) by mouth 2 (two) times daily.   fluticasone  (FLONASE ) 50 MCG/ACT nasal spray Place 2 sprays into both nostrils daily.   lisinopril  (ZESTRIL ) 10 MG tablet Take 1 tablet (10 mg total) by mouth daily.   loratadine  (EQ ALLERGY RELIEF) 10 MG tablet Take 1 tablet (10 mg total) by mouth daily.   meclizine  (ANTIVERT ) 50 MG tablet Take 1 tablet (50 mg total) by mouth 3 (three) times daily as needed.   methocarbamol  (ROBAXIN ) 500 MG tablet Take 1 tablet (  500 mg total) by mouth every 6 (six) hours as needed for muscle spasms.   metoprolol  tartrate (LOPRESSOR ) 25 MG tablet Take 1 tablet (25 mg total) by mouth 2 (two) times daily.   oseltamivir  (TAMIFLU ) 75 MG capsule Take 1 capsule (75 mg total) by mouth 2 (two) times daily for 5 days.   tadalafil  (CIALIS ) 20 MG tablet Take 1 tablet (20 mg total) by mouth as needed for erectile dysfunction.   No facility-administered encounter medications on file as of 07/07/2024.    Allergies[1]  Pertinent ROS per HPI, otherwise unremarkable      Objective:  BP 129/83   Pulse 80   Temp 97.8 F (36.6 C)   Ht 5' 10 (1.778 m)   Wt 214 lb 3.2 oz (97.2 kg)   SpO2 94%   BMI 30.73 kg/m    Wt Readings from Last 3 Encounters:  07/07/24 214 lb 3.2 oz (97.2 kg)  04/04/24 211 lb (95.7 kg)  01/24/24 215 lb (97.5 kg)    Physical Exam Vitals and nursing note reviewed.  Constitutional:      General: He is not in acute distress.    Appearance: He is well-developed. He is obese. He is ill-appearing. He is not toxic-appearing or diaphoretic.  HENT:     Head: Normocephalic and atraumatic.     Right Ear: A middle ear effusion is present.     Left Ear: A middle ear effusion is present.     Nose: Congestion present. No rhinorrhea.     Right Sinus: Frontal sinus tenderness present.     Left Sinus: Frontal sinus tenderness present.     Mouth/Throat:     Lips: Pink.     Mouth: Mucous membranes are moist.      Pharynx: Postnasal drip present. No oropharyngeal exudate or posterior oropharyngeal erythema.  Eyes:     Conjunctiva/sclera: Conjunctivae normal.     Pupils: Pupils are equal, round, and reactive to light.  Cardiovascular:     Rate and Rhythm: Normal rate and regular rhythm.     Heart sounds: Normal heart sounds.  Pulmonary:     Effort: Pulmonary effort is normal.     Breath sounds: Normal breath sounds. No wheezing or rhonchi.  Skin:    General: Skin is warm and dry.     Capillary Refill: Capillary refill takes less than 2 seconds.  Neurological:     General: No focal deficit present.     Mental Status: He is alert and oriented to person, place, and time.  Psychiatric:        Mood and Affect: Mood normal.        Behavior: Behavior normal.        Thought Content: Thought content normal.        Judgment: Judgment normal.      Results for orders placed or performed in visit on 01/17/24  CBC with Differential/Platelet   Collection Time: 01/17/24  8:26 AM  Result Value Ref Range   WBC 6.0 3.4 - 10.8 x10E3/uL   RBC 4.68 4.14 - 5.80 x10E6/uL   Hemoglobin 14.2 13.0 - 17.7 g/dL   Hematocrit 56.1 62.4 - 51.0 %   MCV 94 79 - 97 fL   MCH 30.3 26.6 - 33.0 pg   MCHC 32.4 31.5 - 35.7 g/dL   RDW 86.7 88.3 - 84.5 %   Platelets 326 150 - 450 x10E3/uL   Neutrophils 54 Not Estab. %   Lymphs 30 Not Estab. %  Monocytes 9 Not Estab. %   Eos 6 Not Estab. %   Basos 1 Not Estab. %   Neutrophils Absolute 3.3 1.4 - 7.0 x10E3/uL   Lymphocytes Absolute 1.8 0.7 - 3.1 x10E3/uL   Monocytes Absolute 0.5 0.1 - 0.9 x10E3/uL   EOS (ABSOLUTE) 0.3 0.0 - 0.4 x10E3/uL   Basophils Absolute 0.1 0.0 - 0.2 x10E3/uL   Immature Granulocytes 0 Not Estab. %   Immature Grans (Abs) 0.0 0.0 - 0.1 x10E3/uL  CMP14+EGFR   Collection Time: 01/17/24  8:26 AM  Result Value Ref Range   Glucose 107 (H) 70 - 99 mg/dL   BUN 11 6 - 24 mg/dL   Creatinine, Ser 8.85 0.76 - 1.27 mg/dL   eGFR 77 >40 fO/fpw/8.26    BUN/Creatinine Ratio 10 9 - 20   Sodium 140 134 - 144 mmol/L   Potassium 4.1 3.5 - 5.2 mmol/L   Chloride 103 96 - 106 mmol/L   CO2 22 20 - 29 mmol/L   Calcium  9.4 8.7 - 10.2 mg/dL   Total Protein 6.3 6.0 - 8.5 g/dL   Albumin 4.3 3.8 - 4.9 g/dL   Globulin, Total 2.0 1.5 - 4.5 g/dL   Bilirubin Total 0.8 0.0 - 1.2 mg/dL   Alkaline Phosphatase 37 (L) 44 - 121 IU/L   AST 20 0 - 40 IU/L   ALT 15 0 - 44 IU/L  Lipid panel   Collection Time: 01/17/24  8:26 AM  Result Value Ref Range   Cholesterol, Total 208 (H) 100 - 199 mg/dL   Triglycerides 859 0 - 149 mg/dL   HDL 36 (L) >60 mg/dL   VLDL Cholesterol Cal 25 5 - 40 mg/dL   LDL Chol Calc (NIH) 852 (H) 0 - 99 mg/dL   Chol/HDL Ratio 5.8 (H) 0.0 - 5.0 ratio  PSA, total and free   Collection Time: 01/17/24  8:26 AM  Result Value Ref Range   Prostate Specific Ag, Serum 0.6 0.0 - 4.0 ng/mL   PSA, Free 0.34 N/A ng/mL   PSA, Free Pct 56.7 %  ToxASSURE Select 13 (MW), Urine   Collection Time: 01/17/24 10:52 AM  Result Value Ref Range   Summary FINAL        Pertinent labs & imaging results that were available during my care of the patient were reviewed by me and considered in my medical decision making.  Assessment & Plan:  Adoni was seen today for cough, nasal congestion, headache and chills.  Diagnoses and all orders for this visit:  Sinus drainage -     Veritor SARS-CoV-2 and Flu A+B -     oseltamivir  (TAMIFLU ) 75 MG capsule; Take 1 capsule (75 mg total) by mouth 2 (two) times daily for 5 days.  Acute cough -     Veritor SARS-CoV-2 and Flu A+B -     oseltamivir  (TAMIFLU ) 75 MG capsule; Take 1 capsule (75 mg total) by mouth 2 (two) times daily for 5 days.  Sinus pressure -     Veritor SARS-CoV-2 and Flu A+B -     oseltamivir  (TAMIFLU ) 75 MG capsule; Take 1 capsule (75 mg total) by mouth 2 (two) times daily for 5 days.  Fever and chills -     Veritor SARS-CoV-2 and Flu A+B -     oseltamivir  (TAMIFLU ) 75 MG capsule; Take 1 capsule  (75 mg total) by mouth 2 (two) times daily for 5 days.  Exposure to influenza -     oseltamivir  (TAMIFLU ) 75 MG  capsule; Take 1 capsule (75 mg total) by mouth 2 (two) times daily for 5 days.     Acute upper respiratory infection Symptoms consistent with an acute upper respiratory infection, including sinus drainage, throat irritation, cough, and sinus pressure. Symptoms have persisted for approximately three days. No fever recorded, with the highest temperature being 99.26F. Negative flu test, but exposure to influenza virus is suspected due to wife's recent flu diagnosis and treatment with Tamiflu . - Continue symptomatic care with Flonase , sinus pills, and Mucinex . - Use steam inhalation and hot showers to alleviate congestion. - Monitor symptoms and contact provider if condition worsens or does not improve in five days.  Exposure to influenza virus Exposure to influenza virus due to wife's recent flu diagnosis and treatment with Tamiflu . Negative flu test, but post-exposure prophylaxis with Tamiflu  is recommended. Discussed potential side effects of Tamiflu , including gastrointestinal side effects and bad dreams. - Prescribed Tamiflu  75 mg twice daily for five days. - Sent Tamiflu  prescription to pharmacy in [place]. - Provided information on home care measures.        Continue all other maintenance medications.  Follow up plan: Return if symptoms worsen or fail to improve.   Continue healthy lifestyle choices, including diet (rich in fruits, vegetables, and lean proteins, and low in salt and simple carbohydrates) and exercise (at least 30 minutes of moderate physical activity daily).  Educational handout given for influenza  The above assessment and management plan was discussed with the patient. The patient verbalized understanding of and has agreed to the management plan. Patient is aware to call the clinic if they develop any new symptoms or if symptoms persist or worsen. Patient  is aware when to return to the clinic for a follow-up visit. Patient educated on when it is appropriate to go to the emergency department.   Rosaline Bruns, FNP-C [facility] [phone]     [1] No Known Allergies  "

## 2024-07-17 ENCOUNTER — Encounter: Payer: Self-pay | Admitting: Nurse Practitioner

## 2024-07-17 ENCOUNTER — Ambulatory Visit (INDEPENDENT_AMBULATORY_CARE_PROVIDER_SITE_OTHER): Admitting: Nurse Practitioner

## 2024-07-17 ENCOUNTER — Ambulatory Visit: Payer: Self-pay

## 2024-07-17 VITALS — BP 108/69 | HR 64 | Temp 97.2°F | Ht 70.0 in | Wt 218.0 lb

## 2024-07-17 DIAGNOSIS — R0981 Nasal congestion: Secondary | ICD-10-CM

## 2024-07-17 DIAGNOSIS — J069 Acute upper respiratory infection, unspecified: Secondary | ICD-10-CM

## 2024-07-17 MED ORDER — GUAIFENESIN 200 MG PO TABS: 200.0000 mg | ORAL_TABLET | Freq: Three times a day (TID) | ORAL | 0 refills | Status: AC | PRN

## 2024-07-17 MED ORDER — AZITHROMYCIN 250 MG PO TABS: ORAL_TABLET | ORAL | 0 refills | Status: DC

## 2024-07-17 NOTE — Progress Notes (Signed)
 "    Subjective:  Patient ID: Tyler Barton, male    DOB: 05-04-1971, 54 y.o.   MRN: [id number]  Patient Care Team: Gladis Mustard, FNP as PCP - General (Family Medicine) Mallipeddi, Diannah SQUIBB, MD as PCP - Cardiology (Cardiology) Ladora Ross Lacy Phebe, MD as Referring Physician (Optometry)   Chief Complaint:  Cough (Cough Ongoing since the last visit. Sinus pressure, cough, throat irritation and facial pressure. Mucous is yellow. No otc meds has helped. Believes it turned into a sinus infection. Using humidifier as well. )   HPI: Tyler Barton is a 54 y.o. male presenting on 07/17/2024 for Cough (Cough Ongoing since the last visit. Sinus pressure, cough, throat irritation and facial pressure. Mucous is yellow. No otc meds has helped. Believes it turned into a sinus infection. Using humidifier as well. )   Discussed the use of AI scribe software for clinical note transcription with the patient, who gave verbal consent to proceed.  History of Present Illness Tyler Barton is a 54 year old male who presents with a persistent cough following a recent flu-like illness.  He initially experienced flu-like symptoms and was treated with Tamiflu  after a consultation on January 2nd. Despite negative flu and COVID tests both at home and at the clinic, his condition worsened, and he developed a fever. His wife tested positive for flu A, and he felt significantly more ill than her.  After completing the Tamiflu  course, he used over-the-counter nasal spray and sinus pills, taken in the morning and at night. Approximately five to six days into the illness, his flu symptoms improved, and the fever subsided. However, he developed a persistent cough producing yellow mucus, which is present both when coughing from the chest and clearing his sinuses.  For the cough, he has been using a home remedy made by his sister, consisting of honey and lemon juice, and keeps an onion in the bedroom at night, which he  believes helps. If he wakes up coughing in the morning, it persists and he 'might as well get up.'  He reports congestion and yellow mucus production with his cough.      Relevant past medical, surgical, family, and social history reviewed and updated as indicated.  Allergies and medications reviewed and updated. Data reviewed: Chart in Epic.   Past Medical History:  Diagnosis Date   Anxiety    COVID-19 virus infection 04/2020   a. Ss onset 04/23/2020.   DDD (degenerative disc disease)    Morbid obesity (HCC)     Past Surgical History:  Procedure Laterality Date   KNEE ARTHROSCOPY Left 1993   LIPOMA EXCISION      Social History   Socioeconomic History   Marital status: Married    Spouse name: Santana   Number of children: 4   Years of education: 10   Highest education level: 10th grade  Occupational History   Occupation: disabled    Comment: truck driver  Tobacco Use   Smoking status: Never   Smokeless tobacco: Never  Vaping Use   Vaping status: Never Used  Substance and Sexual Activity   Alcohol use: Yes    Comment: ocassionally - rare   Drug use: No   Sexual activity: Yes  Other Topics Concern   Not on file  Social History Narrative   Tyler Barton resides locally in [place] with his wife and step son. He has four children of his own and one was adopted because he had the child at the  early age of 19. He has two step children. 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 He enjoys working in his shop and piddling with cars. He has recently been diagnosed with a hernia and this is keeping him from doing anything strenuous.    Social Drivers of Health   Tobacco Use: Low Risk (07/17/2024)   Patient History    Smoking Tobacco Use: Never    Smokeless Tobacco Use: Never    Passive Exposure: Not on file  Financial Resource Strain: Low Risk (01/24/2024)   Overall Financial Resource Strain (CARDIA)    Difficulty of Paying Living Expenses: Not hard at all  Food  Insecurity: No Food Insecurity (01/24/2024)   Epic    Worried About Programme Researcher, Broadcasting/film/video in the Last Year: Never true    Ran Out of Food in the Last Year: Never true  Transportation Needs: No Transportation Needs (01/24/2024)   Epic    Lack of Transportation (Medical): No    Lack of Transportation (Non-Medical): No  Physical Activity: Inactive (01/24/2024)   Exercise Vital Sign    Days of Exercise per Week: 0 days    Minutes of Exercise per Session: 0 min  Stress: Stress Concern Present (01/24/2024)   [employer] of Occupational Health - Occupational Stress Questionnaire    Feeling of Stress: To some extent  Social Connections: Moderately Isolated (01/24/2024)   Social Connection and Isolation Panel    Frequency of Communication with Friends and Family: More than three times a week    Frequency of Social Gatherings with Friends and Family: Once a week    Attends Religious Services: Never    Database Administrator or Organizations: No    Attends Banker Meetings: Never    Marital Status: Married  Catering Manager Violence: Not At Risk (01/24/2024)   Epic    Fear of Current or Ex-Partner: No    Emotionally Abused: No    Physically Abused: No    Sexually Abused: No  Depression (PHQ2-9): Medium Risk (07/17/2024)   Depression (PHQ2-9)    PHQ-2 Score: 5  Alcohol Screen: Low Risk (01/24/2024)   Alcohol Screen    Last Alcohol Screening Score (AUDIT): 0  Housing: Unknown (01/24/2024)   Epic    Unable to Pay for Housing in the Last Year: No    Number of Times Moved in the Last Year: Not on file    Homeless in the Last Year: No  Utilities: Not At Risk (01/24/2024)   Epic    Threatened with loss of utilities: No  Health Literacy: Adequate Health Literacy (01/24/2024)   B1300 Health Literacy    Frequency of need for help with medical instructions: Never    Outpatient Encounter Medications as of 07/17/2024  Medication Sig   atorvastatin  (LIPITOR) 40 MG tablet Take 1 tablet  (40 mg total) by mouth daily.   buPROPion  (WELLBUTRIN  XL) 150 MG 24 hr tablet Take 1 tablet (150 mg total) by mouth daily.   celecoxib  (CELEBREX ) 200 MG capsule Take 1 capsule (200 mg total) by mouth 2 (two) times daily.   diazepam  (VALIUM ) 5 MG tablet Take 1 tablet (5 mg total) by mouth every 12 (twelve) hours as needed. for anxiety   famotidine  (PEPCID ) 20 MG tablet Take 1 tablet (20 mg total) by mouth 2 (two) times daily.   fluticasone  (FLONASE ) 50 MCG/ACT nasal spray Place 2 sprays into both nostrils daily.   lisinopril  (ZESTRIL ) 10 MG tablet Take  1 tablet (10 mg total) by mouth daily.   loratadine  (EQ ALLERGY RELIEF) 10 MG tablet Take 1 tablet (10 mg total) by mouth daily.   meclizine  (ANTIVERT ) 50 MG tablet Take 1 tablet (50 mg total) by mouth 3 (three) times daily as needed.   methocarbamol  (ROBAXIN ) 500 MG tablet Take 1 tablet (500 mg total) by mouth every 6 (six) hours as needed for muscle spasms.   metoprolol  tartrate (LOPRESSOR ) 25 MG tablet Take 1 tablet (25 mg total) by mouth 2 (two) times daily.   tadalafil  (CIALIS ) 20 MG tablet Take 1 tablet (20 mg total) by mouth as needed for erectile dysfunction.   No facility-administered encounter medications on file as of 07/17/2024.    Allergies[1]  Pertinent ROS per HPI, otherwise unremarkable      Objective:  BP 108/69   Pulse 64   Temp (!) 97.2 F (36.2 C)   Ht 5' 10 (1.778 m)   Wt 218 lb (98.9 kg)   SpO2 96%   BMI 31.28 kg/m    Wt Readings from Last 3 Encounters:  07/17/24 218 lb (98.9 kg)  07/07/24 214 lb 3.2 oz (97.2 kg)  04/04/24 211 lb (95.7 kg)    Physical Exam Vitals and nursing note reviewed.  Constitutional:      General: He is not in acute distress. HENT:     Head: Normocephalic and atraumatic.     Right Ear: Tympanic membrane, ear canal and external ear normal. There is no impacted cerumen.     Left Ear: Tympanic membrane, ear canal and external ear normal. There is no impacted cerumen.     Nose:  Congestion present.     Mouth/Throat:     Mouth: Mucous membranes are moist.  Eyes:     General: No scleral icterus.    Extraocular Movements: Extraocular movements intact.     Conjunctiva/sclera: Conjunctivae normal.     Pupils: Pupils are equal, round, and reactive to light.  Cardiovascular:     Heart sounds: Normal heart sounds.  Pulmonary:     Effort: Pulmonary effort is normal.     Breath sounds: Normal breath sounds.  Musculoskeletal:        General: Normal range of motion.     Right lower leg: No edema.     Left lower leg: No edema.  Skin:    General: Skin is warm and dry.     Findings: No rash.  Neurological:     Mental Status: He is alert and oriented to person, place, and time.  Psychiatric:        Mood and Affect: Mood normal.        Behavior: Behavior normal.        Thought Content: Thought content normal.        Judgment: Judgment normal.    Physical Exam CHEST: Lungs clear to auscultation bilaterally.     Results for orders placed or performed in visit on 07/07/24  Veritor SARS-CoV-2 and Flu A+B   Collection Time: 07/07/24 11:07 AM   Specimen: Nasal Swab   Nasal Swab  Result Value Ref Range   Influenza A Negative Negative   Influenza B Negative Negative   BD Veritor SARS-CoV-2 Ag Negative Negative       Pertinent labs & imaging results that were available during my care of the patient were reviewed by me and considered in my medical decision making.  Assessment & Plan:  There are no diagnoses linked to this encounter.  Assessment and Plan Tyler Barton is a 54 year old Caucasian male seen today for for URI, no acute distress Assessment & Plan Acute upper respiratory infection with cough and congestion Persistent cough with yellow mucus. Steroids not indicated. - Prescribed Guaifenesin  every 8 hours BID with water until cough resolves. - Prescribed azithromycin : 2 tablets day 1, then 1 tablet daily for 4 days with water. - Advised to obtain medications  from [facility] in [place].      Continue all other maintenance medications.  Follow up plan: No follow-ups on file.   Continue healthy lifestyle choices, including diet (rich in fruits, vegetables, and lean proteins, and low in salt and simple carbohydrates) and exercise (at least 30 minutes of moderate physical activity daily).  Educational handout given for   Clinical References  Upper Respiratory Infection, Adult An upper respiratory infection (URI) affects the nose, throat, and upper airways that lead to the lungs. The most common type of URI is often called the common cold. URIs usually get better on their own, without medical treatment. What are the causes? A URI is caused by a germ (virus). You may catch these germs by: Breathing in droplets from an infected person's cough or sneeze. Touching something that has the germ on it (is contaminated) and then touching your mouth, nose, or eyes. What increases the risk? You are more likely to get a URI if: You are very young or very old. You have close contact with others, such as at work, school, or a health care facility. You smoke. You have long-term (chronic) heart or lung disease. You have a weakened disease-fighting system (immune system). You have nasal allergies or asthma. You have a lot of stress. You have poor nutrition. What are the signs or symptoms? Runny or stuffy (congested) nose. Cough. Sneezing. Sore throat. Headache. Feeling tired (fatigue). Fever. Not wanting to eat as much as usual. Pain in your forehead, behind your eyes, and over your cheekbones (sinus pain). Muscle aches. Redness or irritation of the eyes. Pressure in the ears or face. How is this treated? URIs usually get better on their own within 7-10 days. Medicines cannot cure URIs, but your doctor may recommend certain medicines to help relieve symptoms, such as: Over-the-counter cold medicines. Medicines to reduce coughing (cough suppressants).  Coughing is a type of defense against infection that helps to clear the nose, throat, windpipe, and lungs (respiratory system). Take these medicines only as told by your doctor. Medicines to lower your fever. Follow these instructions at home: Activity Rest as needed. If you have a fever, stay home from work or school until your fever is gone, or until your doctor says you may return to work or school. You should stay home until you cannot spread the infection anymore (you are not contagious). Your doctor may have you wear a face mask so you have less risk of spreading the infection. Relieving symptoms Rinse your mouth often with salt water. To make salt water, dissolve -1 tsp (3-6 g) of salt in 1 cup (237 mL) of warm water. Use a cool-mist humidifier to add moisture to the air. This can help you breathe more easily. Eating and drinking  Drink enough fluid to keep your pee (urine) pale yellow. Eat soups and other clear broths. General instructions  Take over-the-counter and prescription medicines only as told by your doctor. Do not smoke or use any products that contain nicotine or tobacco. If you need help quitting, ask your doctor. Avoid being where people are smoking (avoid  secondhand smoke). Stay up to date on all your shots (immunizations), and get the flu shot every year. Keep all follow-up visits. How to prevent the spread of infection to others  Wash your hands with soap and water for at least 20 seconds. If you cannot use soap and water, use hand sanitizer. Avoid touching your mouth, face, eyes, or nose. Cough or sneeze into a tissue or your sleeve or elbow. Do not cough or sneeze into your hand or into the air. Contact a doctor if: You are getting worse, not better. You have any of these: A fever or chills. Brown or red mucus in your nose. Yellow or brown fluid (discharge)coming from your nose. Pain in your face, especially when you bend forward. Swollen neck  glands. Pain when you swallow. White areas in the back of your throat. Get help right away if: You have shortness of breath that gets worse. You have very bad or constant: Headache. Ear pain. Pain in your forehead, behind your eyes, and over your cheekbones (sinus pain). Chest pain. You have long-lasting (chronic) lung disease along with any of these: Making high-pitched whistling sounds when you breathe, most often when you breathe out (wheezing). Long-lasting cough (more than 14 days). Coughing up blood. A change in your usual mucus. You have a stiff neck. You have changes in your: Vision. Hearing. Thinking. Mood. These symptoms may be an emergency. Get help right away. Call 911. Do not wait to see if the symptoms will go away. Do not drive yourself to the hospital. Summary An upper respiratory infection (URI) is caused by a germ (virus). The most common type of URI is often called the common cold. URIs usually get better within 7-10 days. Take over-the-counter and prescription medicines only as told by your doctor. This information is not intended to replace advice given to you by your health care provider. Make sure you discuss any questions you have with your health care provider. Document Revised: 01/22/2021 Document Reviewed: 01/22/2021 Elsevier Patient Education  2024 Else[employer] Inc. Sinus Infection, Adult A sinus infection is soreness and swelling (inflammation) of your sinuses. Sinuses are hollow spaces in the bones around your face. They are located: Around your eyes. In the middle of your forehead. Behind your nose. In your cheekbones. Your sinuses and nasal passages are lined with a fluid called mucus. Mucus drains out of your sinuses. Swelling can trap mucus in your sinuses. This lets germs (bacteria, virus, or fungus) grow, which leads to infection. Most of the time, this condition is caused by a virus. What are the causes? Allergies. Asthma. Germs. Things that  block your nose or sinuses. Growths in the nose (nasal polyps). Chemicals or irritants in the air. A fungus. This is rare. What increases the risk? Having a weak body defense system (immune system). Doing a lot of swimming or diving. Using nasal sprays too much. Smoking. What are the signs or symptoms? The main symptoms of this condition are pain and a feeling of pressure around the sinuses. Other symptoms include: Stuffy nose (congestion). This may make it hard to breathe through your nose. Runny nose (drainage). Soreness, swelling, and warmth in the sinuses. A cough that may get worse at night. Being unable to smell and taste. Mucus that collects in the throat or the back of the nose (postnasal drip). This may cause a sore throat or bad breath. Being very tired (fatigued). A fever. How is this diagnosed? Your symptoms. Your medical history. A physical exam. Tests  to find out if your condition is short-term (acute) or long-term (chronic). Your doctor may: Check your nose for growths (polyps). Check your sinuses using a tool that has a light on one end (endoscope). Check for allergies or germs. Do imaging tests, such as an MRI or CT scan. How is this treated? Treatment for this condition depends on the cause and whether it is short-term or long-term. If caused by a virus, your symptoms should go away on their own within 10 days. You may be given medicines to relieve symptoms. They include: Medicines that shrink swollen tissue in the nose. A spray that treats swelling of the nostrils. Rinses that help get rid of thick mucus in your nose (nasal saline washes). Medicines that treat allergies (antihistamines). Over-the-counter pain relievers. If caused by bacteria, your doctor may wait to see if you will get better without treatment. You may be given antibiotic medicine if you have: A very bad infection. A weak body defense system. If caused by growths in the nose, surgery may be  needed. Follow these instructions at home: Medicines Take, use, or apply over-the-counter and prescription medicines only as told by your doctor. These may include nasal sprays. If you were prescribed an antibiotic medicine, take it as told by your doctor. Do not stop taking it even if you start to feel better. Hydrate and humidify  Drink enough water to keep your pee (urine) pale yellow. Use a cool mist humidifier to keep the humidity level in your home above 50%. Breathe in steam for 10-15 minutes, 3-4 times a day, or as told by your doctor. You can do this in the bathroom while a hot shower is running. Try not to spend time in cool or dry air. Rest Rest as much as you can. Sleep with your head raised (elevated). Make sure you get enough sleep each night. General instructions  Put a warm, moist washcloth on your face 3-4 times a day, or as often as told by your doctor. Use nasal saline washes as often as told by your doctor. Wash your hands often with soap and water. If you cannot use soap and water, use hand sanitizer. Do not smoke. Avoid being around people who are smoking (secondhand smoke). Keep all follow-up visits. Contact a doctor if: You have a fever. Your symptoms get worse. Your symptoms do not get better within 10 days. Get help right away if: You have a very bad headache. You cannot stop vomiting. You have very bad pain or swelling around your face or eyes. You have trouble seeing. You feel confused. Your neck is stiff. You have trouble breathing. These symptoms may be an emergency. Get help right away. Call 911. Do not wait to see if the symptoms will go away. Do not drive yourself to the hospital. Summary A sinus infection is swelling of your sinuses. Sinuses are hollow spaces in the bones around your face. This condition is caused by tissues in your nose that become inflamed or swollen. This traps germs. These can lead to infection. If you were prescribed an  antibiotic medicine, take it as told by your doctor. Do not stop taking it even if you start to feel better. Keep all follow-up visits. This information is not intended to replace advice given to you by your health care provider. Make sure you discuss any questions you have with your health care provider. Document Revised: 05/27/2021 Document Reviewed: 05/27/2021 Elsevier Patient Education  2024 [employer].  The above assessment and  management plan was discussed with the patient. The patient verbalized understanding of and has agreed to the management plan. Patient is aware to call the clinic if they develop any new symptoms or if symptoms persist or worsen. Patient is aware when to return to the clinic for a follow-up visit. Patient educated on when it is appropriate to go to the emergency department.   Romonda Parker [place] Thompson, DNP [facility] [address] [place], [place] [address] [phone]       [1] No Known Allergies  "

## 2024-07-17 NOTE — Telephone Encounter (Signed)
 NOTED. ls

## 2024-07-17 NOTE — Telephone Encounter (Signed)
" °  FYI Only or Action Required?: FYI only for provider: appointment scheduled on 1/12.  Patient was last seen in primary care on 07/07/2024 by Severa Rock HERO, FNP.  Called Nurse Triage reporting No chief complaint on file..  Symptoms began several weeks ago.  Interventions attempted: Rest, hydration, or home remedies.  Symptoms are: gradually worsening.  Triage Disposition: No disposition on file.  Patient/caregiver understands and will follow disposition?:    Copied from CRM [id number]. Topic: Clinical - Red Word Triage >> Jul 17, 2024  8:10 AM Antwanette L wrote: Red Word that prompted transfer to Nurse Triage: Patient reports ongoing sinus drainage, pressure in the forehead and productive cough with dark yellow mucus. Symptoms have continued since the acute visit on 07/07/24. The patient is requesting medication for symptom relief, including a steroid pills. Reason for Disposition  [1] Continuous (nonstop) coughing interferes with work or school AND [2] no improvement using cough treatment per Care Advice  Answer Assessment - Initial Assessment Questions 1. ONSET: When did the cough begin?      Few weeks ago 2. SEVERITY: How bad is the cough today?      persistent 3. SPUTUM: Describe the color of your sputum (e.g., none, dry cough; clear, white, yellow, green)     yellow 4. HEMOPTYSIS: Are you coughing up any blood? If Yes, ask: How much? (e.g., flecks, streaks, tablespoons, etc.)     denies 5. DIFFICULTY BREATHING: Are you having difficulty breathing? If Yes, ask: How bad is it? (e.g., mild, moderate, severe)      denies 6. FEVER: Do you have a fever? If Yes, ask: What is your temperature, how was it measured, and when did it start?     no 7. CARDIAC HISTORY: Do you have any history of heart disease? (e.g., heart attack, congestive heart failure)      no 8. LUNG HISTORY: Do you have any history of lung disease?  (e.g., pulmonary embolus, asthma, emphysema)      no 9. PE RISK FACTORS: Do you have a history of blood clots? (or: recent major surgery, recent prolonged travel, bedridden)     no 10. OTHER SYMPTOMS: Do you have any other symptoms? (e.g., runny nose, wheezing, chest pain)       Denies SOB, CP. Has nasal drainage  Protocols used: Cough - Acute Productive-A-AH  "

## 2024-07-19 ENCOUNTER — Ambulatory Visit (HOSPITAL_BASED_OUTPATIENT_CLINIC_OR_DEPARTMENT_OTHER): Admitting: Physical Therapy

## 2024-07-20 ENCOUNTER — Ambulatory Visit (INDEPENDENT_AMBULATORY_CARE_PROVIDER_SITE_OTHER): Payer: Self-pay | Admitting: Nurse Practitioner

## 2024-07-20 ENCOUNTER — Encounter: Payer: Self-pay | Admitting: Nurse Practitioner

## 2024-07-20 VITALS — BP 128/81 | HR 60 | Temp 97.7°F | Ht 70.0 in | Wt 220.0 lb

## 2024-07-20 DIAGNOSIS — K582 Mixed irritable bowel syndrome: Secondary | ICD-10-CM

## 2024-07-20 DIAGNOSIS — F419 Anxiety disorder, unspecified: Secondary | ICD-10-CM

## 2024-07-20 DIAGNOSIS — F410 Panic disorder [episodic paroxysmal anxiety] without agoraphobia: Secondary | ICD-10-CM

## 2024-07-20 DIAGNOSIS — F3342 Major depressive disorder, recurrent, in full remission: Secondary | ICD-10-CM

## 2024-07-20 DIAGNOSIS — R051 Acute cough: Secondary | ICD-10-CM

## 2024-07-20 DIAGNOSIS — E782 Mixed hyperlipidemia: Secondary | ICD-10-CM

## 2024-07-20 DIAGNOSIS — I10 Essential (primary) hypertension: Secondary | ICD-10-CM

## 2024-07-20 LAB — CMP14+EGFR
ALT: 32 IU/L (ref 0–44)
AST: 27 IU/L (ref 0–40)
Albumin: 4.2 g/dL (ref 3.8–4.9)
Alkaline Phosphatase: 53 IU/L (ref 47–123)
BUN/Creatinine Ratio: 9 (ref 9–20)
BUN: 9 mg/dL (ref 6–24)
Bilirubin Total: 0.8 mg/dL (ref 0.0–1.2)
CO2: 28 mmol/L (ref 20–29)
Calcium: 9.1 mg/dL (ref 8.7–10.2)
Chloride: 103 mmol/L (ref 96–106)
Creatinine, Ser: 1 mg/dL (ref 0.76–1.27)
Globulin, Total: 1.7 g/dL (ref 1.5–4.5)
Glucose: 87 mg/dL (ref 70–99)
Potassium: 4.5 mmol/L (ref 3.5–5.2)
Sodium: 143 mmol/L (ref 134–144)
Total Protein: 5.9 g/dL — ABNORMAL LOW (ref 6.0–8.5)
eGFR: 89 mL/min/1.73

## 2024-07-20 LAB — CBC WITH DIFFERENTIAL/PLATELET
Basophils Absolute: 0 x10E3/uL (ref 0.0–0.2)
Basos: 0 %
EOS (ABSOLUTE): 0.4 x10E3/uL (ref 0.0–0.4)
Eos: 4 %
Hematocrit: 42.9 % (ref 37.5–51.0)
Hemoglobin: 13.9 g/dL (ref 13.0–17.7)
Immature Grans (Abs): 0.1 x10E3/uL (ref 0.0–0.1)
Immature Granulocytes: 1 %
Lymphocytes Absolute: 1.7 x10E3/uL (ref 0.7–3.1)
Lymphs: 20 %
MCH: 29.8 pg (ref 26.6–33.0)
MCHC: 32.4 g/dL (ref 31.5–35.7)
MCV: 92 fL (ref 79–97)
Monocytes Absolute: 0.9 x10E3/uL (ref 0.1–0.9)
Monocytes: 10 %
Neutrophils Absolute: 5.7 x10E3/uL (ref 1.4–7.0)
Neutrophils: 65 %
Platelets: 409 x10E3/uL (ref 150–450)
RBC: 4.66 x10E6/uL (ref 4.14–5.80)
RDW: 12.9 % (ref 11.6–15.4)
WBC: 8.8 x10E3/uL (ref 3.4–10.8)

## 2024-07-20 LAB — LIPID PANEL
Chol/HDL Ratio: 3 ratio (ref 0.0–5.0)
Cholesterol, Total: 125 mg/dL (ref 100–199)
HDL: 41 mg/dL
LDL Chol Calc (NIH): 70 mg/dL (ref 0–99)
Triglycerides: 70 mg/dL (ref 0–149)
VLDL Cholesterol Cal: 14 mg/dL (ref 5–40)

## 2024-07-20 MED ORDER — ATORVASTATIN CALCIUM 40 MG PO TABS: 40.0000 mg | ORAL_TABLET | Freq: Every day | ORAL | 5 refills | Status: AC

## 2024-07-20 MED ORDER — LISINOPRIL 10 MG PO TABS: 10.0000 mg | ORAL_TABLET | Freq: Every day | ORAL | 1 refills | Status: AC

## 2024-07-20 MED ORDER — PREDNISONE 20 MG PO TABS: 40.0000 mg | ORAL_TABLET | Freq: Every day | ORAL | 0 refills | Status: AC

## 2024-07-20 MED ORDER — FAMOTIDINE 20 MG PO TABS: 20.0000 mg | ORAL_TABLET | Freq: Two times a day (BID) | ORAL | 1 refills | Status: AC

## 2024-07-20 MED ORDER — DIAZEPAM 5 MG PO TABS: 5.0000 mg | ORAL_TABLET | Freq: Two times a day (BID) | ORAL | 5 refills | Status: AC | PRN

## 2024-07-20 MED ORDER — METOPROLOL TARTRATE 25 MG PO TABS: 25.0000 mg | ORAL_TABLET | Freq: Two times a day (BID) | ORAL | 1 refills | Status: AC

## 2024-07-20 MED ORDER — BUPROPION HCL ER (XL) 150 MG PO TB24: 150.0000 mg | ORAL_TABLET | Freq: Every day | ORAL | 1 refills | Status: AC

## 2024-07-20 NOTE — Patient Instructions (Signed)

## 2024-07-20 NOTE — Progress Notes (Signed)
 "  Subjective:    Patient ID: Tyler Barton, male    DOB: 08-Mar-1971, 54 y.o.   MRN: [id number]   Chief Complaint: annual physical    HPI:  Tyler Barton is a 54 y.o. who identifies as a male who was assigned male at birth.   Social history: Lives with: wife Work history: disability for anxiety   Comes in today for follow up of the following chronic medical issues:  1. Primary hypertension No c/o chest pain, sob or headache. He stopped taking lisinopril  cause he says he was just feeling bad. Since then his blood pressure has been running high. BP Readings from Last 3 Encounters:  07/17/24 108/69  07/07/24 129/83  04/04/24 123/82      2. Mixed hyperlipidmia Does not watch diet and does no dedicated exercise. Refuses statin therapy Lab Results  Component Value Date   CHOL 208 (H) 01/17/2024   HDL 36 (L) 01/17/2024   LDLCALC 147 (H) 01/17/2024   TRIG 140 01/17/2024   CHOLHDL 5.8 (H) 01/17/2024   The 10-year ASCVD risk score (Arnett DK, et al., 2019) is: 6.3%   3. Recurrent major depressive disorder, in full remission (HCC) 4. Anxiety 5. Panic disorder Patient  is a very anxious person, he worries about everything. He cannot let things from the past go. When he gets something on his mind he can't let it go.      07/20/2024    8:09 AM 07/17/2024    9:30 AM 04/04/2024    8:00 AM 01/17/2024    8:05 AM  GAD 7 : Generalized Anxiety Score  Nervous, Anxious, on Edge 1 1 2 1   Control/stop worrying 1 1 2 1   Worry too much - different things 1 1 2 1   Trouble relaxing 1 1 2 1   Restless 0 0 2 0  Easily annoyed or irritable 0 0 2 0  Afraid - awful might happen 0 0 1 0  Total GAD 7 Score 4 4 13 4   Anxiety Difficulty Not difficult at all Not difficult at all Very difficult Somewhat difficult         07/20/2024    8:08 AM 07/17/2024    9:30 AM 04/04/2024    7:59 AM  Depression screen PHQ 2/9  Decreased Interest 1 1 1   Down, Depressed, Hopeless 0 0 0  PHQ - 2 Score 1 1  1   Altered sleeping 1 1 1   Tired, decreased energy 1 1 1   Change in appetite 1 1 2   Feeling bad or failure about yourself  0 0 0  Trouble concentrating 0 1 2  Moving slowly or fidgety/restless 0 0 1  Suicidal thoughts 0 0 0  PHQ-9 Score 4 5 8    Difficult doing work/chores Somewhat difficult Somewhat difficult Somewhat difficult     Data saved with a previous flowsheet row definition      6. Irritable bowel syndrome with both constipation and diarrhea Has alternating constipation and diarrhea. Due to his anxiety.  7. Morbid obesity Weight is down 6lbs  Wt Readings from Last 3 Encounters:  07/20/24 220 lb (99.8 kg)  07/17/24 218 lb (98.9 kg)  07/07/24 214 lb 3.2 oz (97.2 kg)   BMI Readings from Last 3 Encounters:  07/20/24 31.57 kg/m  07/17/24 31.28 kg/m  07/07/24 30.73 kg/m         New complaints: Was really sick with flu or covid 2 weeks ago. Cough will not let up. The coughing at night  keeps him up. He was given a zpak 4 days ago and does feel some better this morning.  No Known Allergies Outpatient Encounter Medications as of 07/20/2024  Medication Sig   atorvastatin  (LIPITOR) 40 MG tablet Take 1 tablet (40 mg total) by mouth daily.   azithromycin  (ZITHROMAX ) 250 MG tablet Take 2 tablets on day 1, then 1 tablet daily on days 2 through 5   buPROPion  (WELLBUTRIN  XL) 150 MG 24 hr tablet Take 1 tablet (150 mg total) by mouth daily.   celecoxib  (CELEBREX ) 200 MG capsule Take 1 capsule (200 mg total) by mouth 2 (two) times daily.   diazepam  (VALIUM ) 5 MG tablet Take 1 tablet (5 mg total) by mouth every 12 (twelve) hours as needed. for anxiety   famotidine  (PEPCID ) 20 MG tablet Take 1 tablet (20 mg total) by mouth 2 (two) times daily.   fluticasone  (FLONASE ) 50 MCG/ACT nasal spray Place 2 sprays into both nostrils daily.   guaiFENesin  200 MG tablet Take 1 tablet (200 mg total) by mouth every 8 (eight) hours as needed for cough or to loosen phlegm.   lisinopril   (ZESTRIL ) 10 MG tablet Take 1 tablet (10 mg total) by mouth daily.   loratadine  (EQ ALLERGY RELIEF) 10 MG tablet Take 1 tablet (10 mg total) by mouth daily.   meclizine  (ANTIVERT ) 50 MG tablet Take 1 tablet (50 mg total) by mouth 3 (three) times daily as needed.   methocarbamol  (ROBAXIN ) 500 MG tablet Take 1 tablet (500 mg total) by mouth every 6 (six) hours as needed for muscle spasms.   metoprolol  tartrate (LOPRESSOR ) 25 MG tablet Take 1 tablet (25 mg total) by mouth 2 (two) times daily.   tadalafil  (CIALIS ) 20 MG tablet Take 1 tablet (20 mg total) by mouth as needed for erectile dysfunction.   No facility-administered encounter medications on file as of 07/20/2024.    Past Surgical History:  Procedure Laterality Date   KNEE ARTHROSCOPY Left 1993   LIPOMA EXCISION      Family History  Problem Relation Age of Onset   Anxiety disorder Mother    Anxiety disorder Father    Heart disease Father 64       with stent placement and bypass   Anxiety disorder Sister    Cancer Maternal Uncle        throat   Alzheimer's disease Maternal Grandmother    Cancer Paternal Grandmother    Heart disease Maternal Uncle    Heart disease Maternal Uncle       Controlled substance contract: n/a     Review of Systems  Constitutional:  Negative for diaphoresis.  Eyes:  Negative for pain.  Respiratory:  Negative for shortness of breath.   Cardiovascular:  Negative for chest pain, palpitations and leg swelling.  Gastrointestinal:  Negative for abdominal pain.  Endocrine: Negative for polydipsia.  Skin:  Negative for rash.  Neurological:  Negative for dizziness, weakness and headaches.  Hematological:  Does not bruise/bleed easily.  All other systems reviewed and are negative.      Objective:   Physical Exam Vitals and nursing note reviewed.  Constitutional:      Appearance: Normal appearance. He is well-developed.  HENT:     Head: Normocephalic.     Nose: Nose normal.     Mouth/Throat:      Mouth: Mucous membranes are moist.     Pharynx: Oropharynx is clear.  Eyes:     Pupils: Pupils are equal, round, and reactive to light.  Neck:     Thyroid : No thyroid  mass or thyromegaly.     Vascular: No carotid bruit or JVD.     Trachea: Phonation normal.  Cardiovascular:     Rate and Rhythm: Normal rate and regular rhythm.  Pulmonary:     Effort: Pulmonary effort is normal. No respiratory distress.     Breath sounds: Normal breath sounds.  Abdominal:     General: Bowel sounds are normal.     Palpations: Abdomen is soft.     Tenderness: There is no abdominal tenderness.  Musculoskeletal:        General: Normal range of motion.     Cervical back: Normal range of motion and neck supple.  Lymphadenopathy:     Cervical: No cervical adenopathy.  Skin:    General: Skin is warm and dry.  Neurological:     Mental Status: He is alert and oriented to person, place, and time.  Psychiatric:        Behavior: Behavior normal.        Thought Content: Thought content normal.        Judgment: Judgment normal.     BP 128/81   Pulse 60   Temp 97.7 F (36.5 C) (Temporal)   Ht 5' 10 (1.778 m)   Wt 220 lb (99.8 kg)   SpO2 96%   BMI 31.57 kg/m        Assessment & Plan:   Tyler Barton comes in today with chief complaint of annual physical   Diagnosis and orders addressed:  1. Primary hypertension Low sodium diet Back on lisinopril  - lisinopril  (ZESTRIL ) 10 MG tablet; Take 1 tablet (10 mg total) by mouth daily.  Dispense: 90 tablet; Refill: 1 - CBC with Differential/Platelet - CMP14+EGFR - Lipid panel  2. Recurrent major depressive disorder, in full remission (HCC) Stress management - buPROPion  (WELLBUTRIN  XL) 150 MG 24 hr tablet; Take 1 tablet (150 mg total) by mouth daily.  Dispense: 90 tablet; Refill: 1  3. Anxiety - diazepam  (VALIUM ) 5 MG tablet; Take 1 tablet (5 mg total) by mouth every 12 (twelve) hours as needed. for anxiety  Dispense: 60 tablet; Refill:  5  4. Panic disorder Deep breathing exercises - ToxASSURE Select 13 (MW), Urine - buPROPion  (WELLBUTRIN  XL) 150 MG 24 hr tablet; Take 1 tablet (150 mg total) by mouth daily.  Dispense: 90 tablet; Refill: 1  5. Irritable bowel syndrome with both constipation and diarrhea Watch diet to help with flare ups - famotidine  (PEPCID ) 20 MG tablet; Take 1 tablet (20 mg total) by mouth 2 (two) times daily.  Dispense: 180 tablet; Refill: 1  6. Morbid obesity (HCC) Discussed diet and exercise for person with BMI >25 Will recheck weight in 3-6 months   7. Cough Continue mucinex  OTC Prednisone  40mg  daily for 5 days Force fluids Run humidifier in house Finish z pak as prescribed  Labs pending Health Maintenance reviewed Diet and exercise encouraged  Follow up plan: 6 months   Mary-Margaret Gladis, FNP  "

## 2024-07-21 ENCOUNTER — Ambulatory Visit: Payer: Self-pay | Admitting: Nurse Practitioner

## 2024-07-27 ENCOUNTER — Ambulatory Visit (HOSPITAL_BASED_OUTPATIENT_CLINIC_OR_DEPARTMENT_OTHER): Payer: Self-pay | Admitting: Physical Therapy

## 2024-08-01 NOTE — Therapy (Incomplete)
 " OUTPATIENT PHYSICAL THERAPY THORACOLUMBAR EVALUATION   Patient Name: Tyler Barton MRN: [id number] DOB:06-25-1971, 54 y.o., male Today's Date: 08/01/2024  END OF SESSION:   Past Medical History:  Diagnosis Date   Anxiety    COVID-19 virus infection 04/2020   a. Ss onset 04/23/2020.   DDD (degenerative disc disease)    Morbid obesity (HCC)    Past Surgical History:  Procedure Laterality Date   KNEE ARTHROSCOPY Left 1993   LIPOMA EXCISION     Patient Active Problem List   Diagnosis Date Noted   HLD (hyperlipidemia) 03/09/2023   Cardiac chest pain 03/09/2023   Family history of premature CAD 03/09/2023   Primary hypertension 10/21/2021   Anxiety    BMI 31.0-31.9,adult    Chronic pain of left knee 11/01/2017   Recurrent major depressive disorder, in full remission 05/07/2017   IBS (irritable bowel syndrome) 11/21/2014   Panic disorder 03/14/2013    PCP: Ronal Rollene Lunger FNP  REFERRING PROVIDER: Donaciano Sprang MD  REFERRING DIAG: M54.50 (ICD-10-CM) - Low back pain, unspecified   Rationale for Evaluation and Treatment: Rehabilitation  THERAPY DIAG:  No diagnosis found.  ONSET DATE: 1 yr  SUBJECTIVE:                                                                                                                                                                                           SUBJECTIVE STATEMENT: ***  PERTINENT HISTORY:  . . .S1 radicular pain but no focal motor deficits. ..  back buttock and neuropathic right leg pain . . .  PAIN:  Are you having pain? Yes: NPRS scale: *** Pain location: *** Pain description: *** Aggravating factors: *** Relieving factors: ***  PRECAUTIONS: {Therapy precautions:24002}  RED FLAGS: {PT Red Flags:29287}   WEIGHT BEARING RESTRICTIONS: No  FALLS:  Has patient fallen in last 6 months? {fallsyesno:27318}  LIVING ENVIRONMENT: Lives with: {[facility] lives with:25569::lives with their family} Lives in: {Lives  in:25570} Stairs: {opstairs:27293} Has following equipment at home: {Assistive devices:23999}  OCCUPATION: ***  PLOF: {PLOF:24004}  PATIENT GOALS: ***  NEXT MD VISIT: ***  OBJECTIVE:  Note: Objective measures were completed at Evaluation unless otherwise noted.  DIAGNOSTIC FINDINGS:  ***  PATIENT SURVEYS:  ODI    SENSATION: {sensation:27233}  MUSCLE LENGTH: Hamstrings: Right *** deg; Left *** deg   POSTURE: {posture:25561}  PALPATION: ***  LUMBAR ROM:   AROM eval  Flexion   Extension   Right lateral flexion   Left lateral flexion   Right rotation   Left rotation    (Blank rows = not tested)  LOWER EXTREMITY ROM:     {  AROM/PROM:27142}  Right eval Left eval  Hip flexion    Hip extension    Hip abduction    Hip adduction    Hip internal rotation    Hip external rotation    Knee flexion    Knee extension    Ankle dorsiflexion    Ankle plantarflexion    Ankle inversion    Ankle eversion     (Blank rows = not tested)  LOWER EXTREMITY MMT:    MMT Right eval Left eval  Hip flexion    Hip extension    Hip abduction    Hip adduction    Hip internal rotation    Hip external rotation    Knee flexion    Knee extension    Ankle dorsiflexion    Ankle plantarflexion    Ankle inversion    Ankle eversion     (Blank rows = not tested)  LUMBAR SPECIAL TESTS:  Slump test: {pos/neg:25243}  FUNCTIONAL TESTS:  5 times sit to stand: *** Timed up and go (TUG): ***   4 stage balance GAIT: Distance walked: *** Assistive device utilized: {Assistive devices:23999} Level of assistance: {Levels of assistance:24026} Comments: ***  TREATMENT  Eval Self care:Posture and optometrist instruction                                                                                                                               PATIENT EDUCATION:  Education details: Discussed eval findings, rehab rationale, aquatic program progression/POC and pools in area.  Patient is in agreement  Person educated: Patient Education method: Explanation Education comprehension: verbalized understanding  HOME EXERCISE PROGRAM: ***  ASSESSMENT:  CLINICAL IMPRESSION: Patient is a *** y.o. m who was seen today for physical therapy evaluation and treatment for LBP.   OBJECTIVE IMPAIRMENTS: {opptimpairments:25111}.   ACTIVITY LIMITATIONS: {activitylimitations:27494}  PARTICIPATION LIMITATIONS: {participationrestrictions:25113}  PERSONAL FACTORS: {Personal factors:25162} are also affecting patient's functional outcome.   REHAB POTENTIAL: {rehabpotential:25112}  CLINICAL DECISION MAKING: {clinical decision making:25114}  EVALUATION COMPLEXITY: {Evaluation complexity:25115}   GOALS: Goals reviewed with patient? {yes/no:20286}  SHORT TERM GOALS: Target date: ***  Pt will tolerate full aquatic sessions consistently without increase in pain and with improving function to demonstrate good toleration and effectiveness of intervention.  Baseline: Goal status: INITIAL  2.  *** Baseline:  Goal status: INITIAL  3.  *** Baseline:  Goal status: INITIAL  4.  *** Baseline:  Goal status: INITIAL  5.  *** Baseline:  Goal status: INITIAL  6.  *** Baseline:  Goal status: INITIAL  LONG TERM GOALS: Target date: ***  Pt to improve on ODI by   % to demonstrate statistically significant Improvement in function. (MCID 13-15%) Baseline:  Goal status: INITIAL  2.  *** Baseline:  Goal status: INITIAL  3.  *** Baseline:  Goal status: INITIAL  4.  *** Baseline:  Goal status: INITIAL  5.  *** Baseline:  Goal status: INITIAL  6.  ***  Baseline:  Goal status: INITIAL  PLAN:  PT FREQUENCY: {rehab frequency:25116}  PT DURATION: {rehab duration:25117}  PLANNED INTERVENTIONS: {rehab planned interventions:25118::97110-Therapeutic exercises,97530- Therapeutic [phone]- Neuromuscular re-education,97535- Self Rjmz,02859- Manual  therapy,Patient/Family education}.  PLAN FOR NEXT SESSION: ***   Frankie Tiney Zipper, PT 08/01/2024, 11:01 AM  "

## 2024-08-02 ENCOUNTER — Telehealth (HOSPITAL_BASED_OUTPATIENT_CLINIC_OR_DEPARTMENT_OTHER): Payer: Self-pay | Admitting: Physical Therapy

## 2024-08-02 NOTE — Telephone Encounter (Signed)
 Spoke with pt and he will need to cancel 1/29 due to weather/ice. 2/5 was also cancelled due to needing eval with PT as well as conflicting wound check with MD office. Pt will call to start therapy when he is ready.   Dale Call PT, DPT 08/02/24 9:47 AM

## 2024-08-02 NOTE — Telephone Encounter (Signed)
 Spoke with pt. Needs to cancel 1/29 eval due to weather/ice. 2/5 was cancelled due to needing eval with PT as well as conflicting wound check with MD office. Pt states he will call back when he is ready and able to start therapy.   Dale Call PT, DPT 08/02/24 9:43 AM

## 2024-08-03 ENCOUNTER — Ambulatory Visit (HOSPITAL_BASED_OUTPATIENT_CLINIC_OR_DEPARTMENT_OTHER): Payer: Self-pay | Admitting: Physical Therapy

## 2024-08-10 ENCOUNTER — Ambulatory Visit (HOSPITAL_BASED_OUTPATIENT_CLINIC_OR_DEPARTMENT_OTHER): Admitting: Physical Therapy

## 2025-01-16 ENCOUNTER — Ambulatory Visit: Admitting: Nurse Practitioner

## 2025-01-24 ENCOUNTER — Ambulatory Visit: Payer: Self-pay
